# Patient Record
Sex: Male | Born: 1940 | Race: White | Hispanic: No | Marital: Married | Smoking: Former smoker
Health system: Southern US, Community
[De-identification: ages and names within clinical notes are randomized; demographics above are authoritative.]

## PROBLEM LIST (undated history)

## (undated) DIAGNOSIS — I10 Essential (primary) hypertension: Secondary | ICD-10-CM

## (undated) DIAGNOSIS — E039 Hypothyroidism, unspecified: Secondary | ICD-10-CM

## (undated) DIAGNOSIS — G4730 Sleep apnea, unspecified: Secondary | ICD-10-CM

## (undated) DIAGNOSIS — E119 Type 2 diabetes mellitus without complications: Secondary | ICD-10-CM

## (undated) DIAGNOSIS — I350 Nonrheumatic aortic (valve) stenosis: Secondary | ICD-10-CM

## (undated) HISTORY — PX: APPENDECTOMY: SHX54

---

## 2002-10-30 ENCOUNTER — Encounter: Payer: Self-pay | Admitting: Emergency Medicine

## 2002-10-30 ENCOUNTER — Emergency Department (HOSPITAL_COMMUNITY): Admission: EM | Admit: 2002-10-30 | Discharge: 2002-10-30 | Payer: Self-pay | Admitting: Emergency Medicine

## 2005-03-04 ENCOUNTER — Emergency Department (HOSPITAL_COMMUNITY): Admission: EM | Admit: 2005-03-04 | Discharge: 2005-03-05 | Payer: Self-pay | Admitting: *Deleted

## 2005-10-09 ENCOUNTER — Inpatient Hospital Stay (HOSPITAL_COMMUNITY): Admission: AD | Admit: 2005-10-09 | Discharge: 2005-10-12 | Payer: Self-pay | Admitting: Endocrinology

## 2008-04-24 ENCOUNTER — Ambulatory Visit: Payer: Self-pay | Admitting: Internal Medicine

## 2008-04-24 DIAGNOSIS — R141 Gas pain: Secondary | ICD-10-CM

## 2008-04-24 DIAGNOSIS — K219 Gastro-esophageal reflux disease without esophagitis: Secondary | ICD-10-CM | POA: Insufficient documentation

## 2008-04-24 DIAGNOSIS — R142 Eructation: Secondary | ICD-10-CM

## 2008-04-24 DIAGNOSIS — R143 Flatulence: Secondary | ICD-10-CM

## 2008-04-24 DIAGNOSIS — R1084 Generalized abdominal pain: Secondary | ICD-10-CM | POA: Insufficient documentation

## 2008-04-25 ENCOUNTER — Ambulatory Visit (HOSPITAL_COMMUNITY): Admission: RE | Admit: 2008-04-25 | Discharge: 2008-04-25 | Payer: Self-pay | Admitting: Internal Medicine

## 2008-05-23 ENCOUNTER — Ambulatory Visit: Payer: Self-pay | Admitting: Internal Medicine

## 2008-05-23 ENCOUNTER — Encounter: Payer: Self-pay | Admitting: Internal Medicine

## 2008-05-25 ENCOUNTER — Encounter: Payer: Self-pay | Admitting: Internal Medicine

## 2009-02-02 ENCOUNTER — Encounter: Payer: Self-pay | Admitting: Internal Medicine

## 2009-02-02 ENCOUNTER — Telehealth (INDEPENDENT_AMBULATORY_CARE_PROVIDER_SITE_OTHER): Payer: Self-pay | Admitting: *Deleted

## 2009-08-30 DIAGNOSIS — G4730 Sleep apnea, unspecified: Secondary | ICD-10-CM | POA: Diagnosis present

## 2009-08-30 DIAGNOSIS — F319 Bipolar disorder, unspecified: Secondary | ICD-10-CM | POA: Diagnosis present

## 2009-08-30 DIAGNOSIS — N400 Enlarged prostate without lower urinary tract symptoms: Secondary | ICD-10-CM | POA: Diagnosis present

## 2009-08-30 DIAGNOSIS — E785 Hyperlipidemia, unspecified: Secondary | ICD-10-CM | POA: Diagnosis present

## 2009-08-30 DIAGNOSIS — I2510 Atherosclerotic heart disease of native coronary artery without angina pectoris: Secondary | ICD-10-CM | POA: Insufficient documentation

## 2009-08-30 DIAGNOSIS — N401 Enlarged prostate with lower urinary tract symptoms: Secondary | ICD-10-CM | POA: Diagnosis present

## 2010-07-16 DIAGNOSIS — K760 Fatty (change of) liver, not elsewhere classified: Secondary | ICD-10-CM | POA: Insufficient documentation

## 2011-02-28 NOTE — Discharge Summary (Signed)
[name], [name]                 ACCOUNT NO.:  [id number]   MEDICAL RECORD NO.:  [id number]          PATIENT TYPE:  INP   LOCATION:  3710                         FACILITY:  [facility]   PHYSICIAN:  Tera Mater. Evlyn Kanner, M.D. DATE OF BIRTH:  10/09/1941   DATE OF ADMISSION:  10/09/2005  DATE OF DISCHARGE:  10/12/2005                                 DISCHARGE SUMMARY   DISCHARGE DIAGNOSES:  1.  Hyperosmolar nonketotic coma, clinically improved.  2.  Head and neck infection.  3.  Candida esophagitis.  4.  Possible sepsis syndrome.  5.  Hyperlipidemia.  6.  History of hypertension.  7.  History of bipolar disorder.  8.  Coronary artery disease, clinically stable.  9.  Sleep apnea.   CONSULTATIONS:  Kinnie Scales. Annalee Genta, M.D., October 10, 2005.   [name] is a 70 year old white male with longstanding impaired glucose  tolerance leading to diabetes in 1991.  He had been maintained without  specific diabetes medications and followed carefully as an outpatient.  He  presented to my office on October 09, 2005, being ill for 10 days.  He had  been seen September 29, 2005, with bilateral sialoadenitis and saw Dr.  Annalee Genta.  At the time I saw him he had no p.o. intake for a week of any  significance.  His fluid intake was poor.  He was weak and dizzy.  His blood  sugar had gone way up over 600.  He also had leukocytosis and hypotension.  Based on this, I was concerned that he might have ketoacidosis.  He at least  had significant volume deficits and perhaps early sepsis syndrome.  Fortunately, the initial evaluation was negative for ketoacidosis.  I  appeared that he just had out of control diabetes.  He was initially treated  with IV insulin and transitioned actually to oral agents.  Fluid  resuscitation and antibiotics significantly improved his situation.  He was  seen during this hospitalization by Dr. Onalee Hua L. Annalee Genta who did do a CT  scan of the neck.  He found no evidence of worsening  problems.  Patient was  clinically advanced on his diet, eating and drinking with improved vital  signs and was discharged home in improved condition on October 12, 2005.  At discharge, Dr. Jarold Motto chose to send him home on oral antidiabetic  agents.  He was started on Avandryl 4/4 mg once daily.  Additionally, given  Augmentin 875 twice a day for seven days, Nystatin four times a day for 10  days, additional medications included a resumption of his Mavik 4 mg daily,  Diovan 160/12.5 twice daily, Caduet 40 daily and aspirin 81 daily.  Glucophage was stopped.  He was to check his body weight each morning, call  if weight increases by five pounds.  Call for severe diarrhea.  Call for  blood sugars less than 60/300.  His diet was to be no concentrated sweets.  He had no pain management necessary.   LABORATORY DATA:  Final chemistry on October 10, 2005, sodium 134,  potassium 4.1, chloride 102, CO2 25, BUN 18,  creatinine 0.8, calcium 8.3,  glucose 86.  White count 19,700, hemoglobin 11.9, platelets 521,000.  Chemistries on October 11, 2006, sodium 134, potassium 4.1, chloride 105,  CO2 24, BUN 15, creatinine 0.9, glucose 180.  At that point, on October 11, 2006, white count was down to 16,400, hemoglobin 10.9, platelets 407,000.  At presentation, his chemistries revealed sodium 132, potassium 4.9,  chloride 95, CO2 26, BUN 36, creatinine 1.2, glucose 456, total protein 6.8,  albumin 2.3. AST 21 and repeat was 22, ALT was abnormal slightly at 46,  normal was 33, alkaline phosphatase 144, normal, repeat at 106, total  bilirubin 0.8.  Blood cultures x2 were negative.  C peptide was 1.32.   The radiology studies do not appear to have made it to the chart.   In summary, we have a 70 year old white male admitted with out of control  diabetes in the setting of a head and neck infection.  He did not have an  acute acidosis.  He was treated with IV insulin and transitioned to oral  agents.   He did have some pancreatic function as evidenced by C peptide.  Portion of blood cultures remain negative and patient clinically improved  promptly.  The patient was discharged home in improved condition as noted  above.           ______________________________  Tera Mater. Evlyn Kanner, M.D.     SAS/MEDQ  D:  12/25/2005  T:  12/26/2005  Job:  [id number]   cc:   Onalee Hua L. Annalee Genta, M.D.  Fax: [phone]

## 2011-04-15 DIAGNOSIS — E1129 Type 2 diabetes mellitus with other diabetic kidney complication: Secondary | ICD-10-CM | POA: Diagnosis present

## 2011-04-15 DIAGNOSIS — E1169 Type 2 diabetes mellitus with other specified complication: Secondary | ICD-10-CM | POA: Diagnosis present

## 2011-07-11 LAB — GLUCOSE, CAPILLARY
Glucose-Capillary: 124 — ABNORMAL HIGH
Glucose-Capillary: 159 — ABNORMAL HIGH

## 2011-12-29 DIAGNOSIS — E669 Obesity, unspecified: Secondary | ICD-10-CM | POA: Diagnosis present

## 2013-06-07 ENCOUNTER — Encounter: Payer: Self-pay | Admitting: Internal Medicine

## 2014-03-22 ENCOUNTER — Encounter: Payer: Self-pay | Admitting: Internal Medicine

## 2015-02-02 DIAGNOSIS — G4730 Sleep apnea, unspecified: Secondary | ICD-10-CM | POA: Diagnosis not present

## 2015-02-02 DIAGNOSIS — N08 Glomerular disorders in diseases classified elsewhere: Secondary | ICD-10-CM | POA: Diagnosis not present

## 2015-02-02 DIAGNOSIS — D126 Benign neoplasm of colon, unspecified: Secondary | ICD-10-CM | POA: Diagnosis not present

## 2015-02-02 DIAGNOSIS — I10 Essential (primary) hypertension: Secondary | ICD-10-CM | POA: Diagnosis not present

## 2015-02-02 DIAGNOSIS — N401 Enlarged prostate with lower urinary tract symptoms: Secondary | ICD-10-CM | POA: Diagnosis not present

## 2015-02-02 DIAGNOSIS — E1129 Type 2 diabetes mellitus with other diabetic kidney complication: Secondary | ICD-10-CM | POA: Diagnosis not present

## 2015-02-02 DIAGNOSIS — E785 Hyperlipidemia, unspecified: Secondary | ICD-10-CM | POA: Diagnosis not present

## 2015-02-02 DIAGNOSIS — I2510 Atherosclerotic heart disease of native coronary artery without angina pectoris: Secondary | ICD-10-CM | POA: Diagnosis not present

## 2015-02-08 ENCOUNTER — Encounter: Payer: Self-pay | Admitting: Internal Medicine

## 2015-03-14 DIAGNOSIS — H5210 Myopia, unspecified eye: Secondary | ICD-10-CM | POA: Diagnosis not present

## 2015-06-05 DIAGNOSIS — E1129 Type 2 diabetes mellitus with other diabetic kidney complication: Secondary | ICD-10-CM | POA: Diagnosis not present

## 2015-06-05 DIAGNOSIS — N08 Glomerular disorders in diseases classified elsewhere: Secondary | ICD-10-CM | POA: Diagnosis not present

## 2015-06-05 DIAGNOSIS — N401 Enlarged prostate with lower urinary tract symptoms: Secondary | ICD-10-CM | POA: Diagnosis not present

## 2015-06-05 DIAGNOSIS — D126 Benign neoplasm of colon, unspecified: Secondary | ICD-10-CM | POA: Diagnosis not present

## 2015-06-05 DIAGNOSIS — E11359 Type 2 diabetes mellitus with proliferative diabetic retinopathy without macular edema: Secondary | ICD-10-CM | POA: Diagnosis not present

## 2015-06-05 DIAGNOSIS — I2510 Atherosclerotic heart disease of native coronary artery without angina pectoris: Secondary | ICD-10-CM | POA: Diagnosis not present

## 2015-06-05 DIAGNOSIS — E785 Hyperlipidemia, unspecified: Secondary | ICD-10-CM | POA: Diagnosis not present

## 2015-06-05 DIAGNOSIS — K760 Fatty (change of) liver, not elsewhere classified: Secondary | ICD-10-CM | POA: Diagnosis not present

## 2015-10-09 DIAGNOSIS — E11319 Type 2 diabetes mellitus with unspecified diabetic retinopathy without macular edema: Secondary | ICD-10-CM | POA: Diagnosis not present

## 2015-10-09 DIAGNOSIS — I2510 Atherosclerotic heart disease of native coronary artery without angina pectoris: Secondary | ICD-10-CM | POA: Diagnosis not present

## 2015-10-09 DIAGNOSIS — N08 Glomerular disorders in diseases classified elsewhere: Secondary | ICD-10-CM | POA: Diagnosis not present

## 2015-10-09 DIAGNOSIS — D126 Benign neoplasm of colon, unspecified: Secondary | ICD-10-CM | POA: Diagnosis not present

## 2015-10-09 DIAGNOSIS — E1129 Type 2 diabetes mellitus with other diabetic kidney complication: Secondary | ICD-10-CM | POA: Diagnosis not present

## 2015-10-09 DIAGNOSIS — E113599 Type 2 diabetes mellitus with proliferative diabetic retinopathy without macular edema, unspecified eye: Secondary | ICD-10-CM | POA: Diagnosis not present

## 2015-10-09 DIAGNOSIS — E119 Type 2 diabetes mellitus without complications: Secondary | ICD-10-CM | POA: Diagnosis not present

## 2015-10-09 DIAGNOSIS — M5416 Radiculopathy, lumbar region: Secondary | ICD-10-CM | POA: Diagnosis not present

## 2016-02-06 DIAGNOSIS — I2510 Atherosclerotic heart disease of native coronary artery without angina pectoris: Secondary | ICD-10-CM | POA: Diagnosis not present

## 2016-02-06 DIAGNOSIS — Z1389 Encounter for screening for other disorder: Secondary | ICD-10-CM | POA: Diagnosis not present

## 2016-02-06 DIAGNOSIS — E784 Other hyperlipidemia: Secondary | ICD-10-CM | POA: Diagnosis not present

## 2016-02-06 DIAGNOSIS — E113593 Type 2 diabetes mellitus with proliferative diabetic retinopathy without macular edema, bilateral: Secondary | ICD-10-CM | POA: Diagnosis not present

## 2016-02-06 DIAGNOSIS — D126 Benign neoplasm of colon, unspecified: Secondary | ICD-10-CM | POA: Diagnosis not present

## 2016-02-06 DIAGNOSIS — N08 Glomerular disorders in diseases classified elsewhere: Secondary | ICD-10-CM | POA: Diagnosis not present

## 2016-02-06 DIAGNOSIS — Z6832 Body mass index (BMI) 32.0-32.9, adult: Secondary | ICD-10-CM | POA: Diagnosis not present

## 2016-02-06 DIAGNOSIS — N401 Enlarged prostate with lower urinary tract symptoms: Secondary | ICD-10-CM | POA: Diagnosis not present

## 2016-02-06 DIAGNOSIS — I10 Essential (primary) hypertension: Secondary | ICD-10-CM | POA: Diagnosis not present

## 2016-03-04 DIAGNOSIS — D225 Melanocytic nevi of trunk: Secondary | ICD-10-CM | POA: Diagnosis not present

## 2016-03-04 DIAGNOSIS — L814 Other melanin hyperpigmentation: Secondary | ICD-10-CM | POA: Diagnosis not present

## 2016-03-04 DIAGNOSIS — L820 Inflamed seborrheic keratosis: Secondary | ICD-10-CM | POA: Diagnosis not present

## 2016-03-04 DIAGNOSIS — D1801 Hemangioma of skin and subcutaneous tissue: Secondary | ICD-10-CM | POA: Diagnosis not present

## 2016-06-04 DIAGNOSIS — K760 Fatty (change of) liver, not elsewhere classified: Secondary | ICD-10-CM | POA: Diagnosis not present

## 2016-06-04 DIAGNOSIS — E784 Other hyperlipidemia: Secondary | ICD-10-CM | POA: Diagnosis not present

## 2016-06-04 DIAGNOSIS — M5416 Radiculopathy, lumbar region: Secondary | ICD-10-CM | POA: Diagnosis not present

## 2016-06-04 DIAGNOSIS — I2510 Atherosclerotic heart disease of native coronary artery without angina pectoris: Secondary | ICD-10-CM | POA: Diagnosis not present

## 2016-06-04 DIAGNOSIS — N401 Enlarged prostate with lower urinary tract symptoms: Secondary | ICD-10-CM | POA: Diagnosis not present

## 2016-06-04 DIAGNOSIS — E113599 Type 2 diabetes mellitus with proliferative diabetic retinopathy without macular edema, unspecified eye: Secondary | ICD-10-CM | POA: Diagnosis not present

## 2016-06-04 DIAGNOSIS — D126 Benign neoplasm of colon, unspecified: Secondary | ICD-10-CM | POA: Diagnosis not present

## 2016-06-04 DIAGNOSIS — E1129 Type 2 diabetes mellitus with other diabetic kidney complication: Secondary | ICD-10-CM | POA: Diagnosis not present

## 2016-06-04 DIAGNOSIS — N08 Glomerular disorders in diseases classified elsewhere: Secondary | ICD-10-CM | POA: Diagnosis not present

## 2016-09-16 DIAGNOSIS — N401 Enlarged prostate with lower urinary tract symptoms: Secondary | ICD-10-CM | POA: Diagnosis not present

## 2016-09-16 DIAGNOSIS — I10 Essential (primary) hypertension: Secondary | ICD-10-CM | POA: Diagnosis not present

## 2016-09-16 DIAGNOSIS — G4739 Other sleep apnea: Secondary | ICD-10-CM | POA: Diagnosis not present

## 2016-09-16 DIAGNOSIS — D126 Benign neoplasm of colon, unspecified: Secondary | ICD-10-CM | POA: Diagnosis not present

## 2016-09-16 DIAGNOSIS — E1129 Type 2 diabetes mellitus with other diabetic kidney complication: Secondary | ICD-10-CM | POA: Diagnosis not present

## 2016-09-16 DIAGNOSIS — I2510 Atherosclerotic heart disease of native coronary artery without angina pectoris: Secondary | ICD-10-CM | POA: Diagnosis not present

## 2016-09-16 DIAGNOSIS — E11319 Type 2 diabetes mellitus with unspecified diabetic retinopathy without macular edema: Secondary | ICD-10-CM | POA: Diagnosis not present

## 2016-09-16 DIAGNOSIS — E784 Other hyperlipidemia: Secondary | ICD-10-CM | POA: Diagnosis not present

## 2016-09-16 DIAGNOSIS — N08 Glomerular disorders in diseases classified elsewhere: Secondary | ICD-10-CM | POA: Diagnosis not present

## 2017-01-21 DIAGNOSIS — N08 Glomerular disorders in diseases classified elsewhere: Secondary | ICD-10-CM | POA: Diagnosis not present

## 2017-01-21 DIAGNOSIS — E113599 Type 2 diabetes mellitus with proliferative diabetic retinopathy without macular edema, unspecified eye: Secondary | ICD-10-CM | POA: Diagnosis not present

## 2017-01-21 DIAGNOSIS — E784 Other hyperlipidemia: Secondary | ICD-10-CM | POA: Diagnosis not present

## 2017-01-21 DIAGNOSIS — N401 Enlarged prostate with lower urinary tract symptoms: Secondary | ICD-10-CM | POA: Diagnosis not present

## 2017-01-21 DIAGNOSIS — I10 Essential (primary) hypertension: Secondary | ICD-10-CM | POA: Diagnosis not present

## 2017-01-21 DIAGNOSIS — M25551 Pain in right hip: Secondary | ICD-10-CM | POA: Diagnosis not present

## 2017-01-21 DIAGNOSIS — E1129 Type 2 diabetes mellitus with other diabetic kidney complication: Secondary | ICD-10-CM | POA: Diagnosis not present

## 2017-01-21 DIAGNOSIS — K219 Gastro-esophageal reflux disease without esophagitis: Secondary | ICD-10-CM | POA: Diagnosis not present

## 2017-01-21 DIAGNOSIS — K760 Fatty (change of) liver, not elsewhere classified: Secondary | ICD-10-CM | POA: Diagnosis not present

## 2017-02-27 DIAGNOSIS — K006 Disturbances in tooth eruption: Secondary | ICD-10-CM | POA: Diagnosis not present

## 2017-05-19 DIAGNOSIS — E1129 Type 2 diabetes mellitus with other diabetic kidney complication: Secondary | ICD-10-CM | POA: Diagnosis not present

## 2017-05-19 DIAGNOSIS — Z125 Encounter for screening for malignant neoplasm of prostate: Secondary | ICD-10-CM | POA: Diagnosis not present

## 2017-05-19 DIAGNOSIS — E784 Other hyperlipidemia: Secondary | ICD-10-CM | POA: Diagnosis not present

## 2017-05-19 DIAGNOSIS — I10 Essential (primary) hypertension: Secondary | ICD-10-CM | POA: Diagnosis not present

## 2017-05-26 DIAGNOSIS — E784 Other hyperlipidemia: Secondary | ICD-10-CM | POA: Diagnosis not present

## 2017-05-26 DIAGNOSIS — G4739 Other sleep apnea: Secondary | ICD-10-CM | POA: Diagnosis not present

## 2017-05-26 DIAGNOSIS — N08 Glomerular disorders in diseases classified elsewhere: Secondary | ICD-10-CM | POA: Diagnosis not present

## 2017-05-26 DIAGNOSIS — D126 Benign neoplasm of colon, unspecified: Secondary | ICD-10-CM | POA: Diagnosis not present

## 2017-05-26 DIAGNOSIS — Z0000 Encounter for general adult medical examination without abnormal findings: Secondary | ICD-10-CM | POA: Diagnosis not present

## 2017-05-26 DIAGNOSIS — E1129 Type 2 diabetes mellitus with other diabetic kidney complication: Secondary | ICD-10-CM | POA: Diagnosis not present

## 2017-05-26 DIAGNOSIS — Z6832 Body mass index (BMI) 32.0-32.9, adult: Secondary | ICD-10-CM | POA: Diagnosis not present

## 2017-05-26 DIAGNOSIS — I10 Essential (primary) hypertension: Secondary | ICD-10-CM | POA: Diagnosis not present

## 2017-05-26 DIAGNOSIS — E113599 Type 2 diabetes mellitus with proliferative diabetic retinopathy without macular edema, unspecified eye: Secondary | ICD-10-CM | POA: Diagnosis not present

## 2017-06-03 DIAGNOSIS — Z1212 Encounter for screening for malignant neoplasm of rectum: Secondary | ICD-10-CM | POA: Diagnosis not present

## 2017-08-18 DIAGNOSIS — L089 Local infection of the skin and subcutaneous tissue, unspecified: Secondary | ICD-10-CM | POA: Diagnosis not present

## 2017-08-18 DIAGNOSIS — Z6831 Body mass index (BMI) 31.0-31.9, adult: Secondary | ICD-10-CM | POA: Diagnosis not present

## 2017-09-01 DIAGNOSIS — R300 Dysuria: Secondary | ICD-10-CM | POA: Diagnosis not present

## 2017-09-14 DIAGNOSIS — H5210 Myopia, unspecified eye: Secondary | ICD-10-CM | POA: Diagnosis not present

## 2017-09-23 DIAGNOSIS — I10 Essential (primary) hypertension: Secondary | ICD-10-CM | POA: Diagnosis not present

## 2017-09-23 DIAGNOSIS — N08 Glomerular disorders in diseases classified elsewhere: Secondary | ICD-10-CM | POA: Diagnosis not present

## 2017-09-23 DIAGNOSIS — E7849 Other hyperlipidemia: Secondary | ICD-10-CM | POA: Diagnosis not present

## 2017-09-23 DIAGNOSIS — Z6831 Body mass index (BMI) 31.0-31.9, adult: Secondary | ICD-10-CM | POA: Diagnosis not present

## 2017-09-23 DIAGNOSIS — N401 Enlarged prostate with lower urinary tract symptoms: Secondary | ICD-10-CM | POA: Diagnosis not present

## 2017-09-23 DIAGNOSIS — E1129 Type 2 diabetes mellitus with other diabetic kidney complication: Secondary | ICD-10-CM | POA: Diagnosis not present

## 2017-09-23 DIAGNOSIS — H3520 Other non-diabetic proliferative retinopathy, unspecified eye: Secondary | ICD-10-CM | POA: Diagnosis not present

## 2017-09-23 DIAGNOSIS — I2510 Atherosclerotic heart disease of native coronary artery without angina pectoris: Secondary | ICD-10-CM | POA: Diagnosis not present

## 2017-09-23 DIAGNOSIS — K760 Fatty (change of) liver, not elsewhere classified: Secondary | ICD-10-CM | POA: Diagnosis not present

## 2018-01-26 DIAGNOSIS — K760 Fatty (change of) liver, not elsewhere classified: Secondary | ICD-10-CM | POA: Diagnosis not present

## 2018-01-26 DIAGNOSIS — H3520 Other non-diabetic proliferative retinopathy, unspecified eye: Secondary | ICD-10-CM | POA: Diagnosis not present

## 2018-01-26 DIAGNOSIS — D126 Benign neoplasm of colon, unspecified: Secondary | ICD-10-CM | POA: Diagnosis not present

## 2018-01-26 DIAGNOSIS — N183 Chronic kidney disease, stage 3 (moderate): Secondary | ICD-10-CM | POA: Diagnosis not present

## 2018-01-26 DIAGNOSIS — I10 Essential (primary) hypertension: Secondary | ICD-10-CM | POA: Diagnosis not present

## 2018-01-26 DIAGNOSIS — E7849 Other hyperlipidemia: Secondary | ICD-10-CM | POA: Diagnosis not present

## 2018-01-26 DIAGNOSIS — N401 Enlarged prostate with lower urinary tract symptoms: Secondary | ICD-10-CM | POA: Diagnosis not present

## 2018-01-26 DIAGNOSIS — E1129 Type 2 diabetes mellitus with other diabetic kidney complication: Secondary | ICD-10-CM | POA: Diagnosis not present

## 2018-01-26 DIAGNOSIS — I2510 Atherosclerotic heart disease of native coronary artery without angina pectoris: Secondary | ICD-10-CM | POA: Diagnosis not present

## 2018-05-25 DIAGNOSIS — D126 Benign neoplasm of colon, unspecified: Secondary | ICD-10-CM | POA: Diagnosis not present

## 2018-05-25 DIAGNOSIS — H3520 Other non-diabetic proliferative retinopathy, unspecified eye: Secondary | ICD-10-CM | POA: Diagnosis not present

## 2018-05-25 DIAGNOSIS — I10 Essential (primary) hypertension: Secondary | ICD-10-CM | POA: Diagnosis not present

## 2018-05-25 DIAGNOSIS — N401 Enlarged prostate with lower urinary tract symptoms: Secondary | ICD-10-CM | POA: Diagnosis not present

## 2018-05-25 DIAGNOSIS — N183 Chronic kidney disease, stage 3 (moderate): Secondary | ICD-10-CM | POA: Diagnosis not present

## 2018-05-25 DIAGNOSIS — I712 Thoracic aortic aneurysm, without rupture: Secondary | ICD-10-CM | POA: Diagnosis not present

## 2018-05-25 DIAGNOSIS — E1129 Type 2 diabetes mellitus with other diabetic kidney complication: Secondary | ICD-10-CM | POA: Diagnosis not present

## 2018-05-25 DIAGNOSIS — E7849 Other hyperlipidemia: Secondary | ICD-10-CM | POA: Diagnosis not present

## 2018-09-27 DIAGNOSIS — N183 Chronic kidney disease, stage 3 (moderate): Secondary | ICD-10-CM | POA: Diagnosis not present

## 2018-09-27 DIAGNOSIS — E7849 Other hyperlipidemia: Secondary | ICD-10-CM | POA: Diagnosis not present

## 2018-09-27 DIAGNOSIS — N401 Enlarged prostate with lower urinary tract symptoms: Secondary | ICD-10-CM | POA: Diagnosis not present

## 2018-09-27 DIAGNOSIS — E1129 Type 2 diabetes mellitus with other diabetic kidney complication: Secondary | ICD-10-CM | POA: Diagnosis not present

## 2018-09-27 DIAGNOSIS — E113599 Type 2 diabetes mellitus with proliferative diabetic retinopathy without macular edema, unspecified eye: Secondary | ICD-10-CM | POA: Diagnosis not present

## 2018-09-27 DIAGNOSIS — I10 Essential (primary) hypertension: Secondary | ICD-10-CM | POA: Diagnosis not present

## 2018-09-27 DIAGNOSIS — I712 Thoracic aortic aneurysm, without rupture: Secondary | ICD-10-CM | POA: Diagnosis not present

## 2019-01-12 DIAGNOSIS — L0109 Other impetigo: Secondary | ICD-10-CM | POA: Diagnosis not present

## 2019-01-12 DIAGNOSIS — L308 Other specified dermatitis: Secondary | ICD-10-CM | POA: Diagnosis not present

## 2019-02-07 DIAGNOSIS — M5416 Radiculopathy, lumbar region: Secondary | ICD-10-CM | POA: Diagnosis not present

## 2019-02-07 DIAGNOSIS — I10 Essential (primary) hypertension: Secondary | ICD-10-CM | POA: Diagnosis not present

## 2019-02-07 DIAGNOSIS — H3520 Other non-diabetic proliferative retinopathy, unspecified eye: Secondary | ICD-10-CM | POA: Diagnosis not present

## 2019-02-07 DIAGNOSIS — N183 Chronic kidney disease, stage 3 (moderate): Secondary | ICD-10-CM | POA: Diagnosis not present

## 2019-02-07 DIAGNOSIS — K760 Fatty (change of) liver, not elsewhere classified: Secondary | ICD-10-CM | POA: Diagnosis not present

## 2019-02-07 DIAGNOSIS — K219 Gastro-esophageal reflux disease without esophagitis: Secondary | ICD-10-CM | POA: Diagnosis not present

## 2019-02-07 DIAGNOSIS — E1129 Type 2 diabetes mellitus with other diabetic kidney complication: Secondary | ICD-10-CM | POA: Diagnosis not present

## 2019-02-07 DIAGNOSIS — E785 Hyperlipidemia, unspecified: Secondary | ICD-10-CM | POA: Diagnosis not present

## 2019-02-07 DIAGNOSIS — N401 Enlarged prostate with lower urinary tract symptoms: Secondary | ICD-10-CM | POA: Diagnosis not present

## 2019-05-16 DIAGNOSIS — E1129 Type 2 diabetes mellitus with other diabetic kidney complication: Secondary | ICD-10-CM | POA: Diagnosis not present

## 2019-05-16 DIAGNOSIS — N183 Chronic kidney disease, stage 3 (moderate): Secondary | ICD-10-CM | POA: Diagnosis not present

## 2019-05-16 DIAGNOSIS — E7849 Other hyperlipidemia: Secondary | ICD-10-CM | POA: Diagnosis not present

## 2019-05-16 DIAGNOSIS — Z125 Encounter for screening for malignant neoplasm of prostate: Secondary | ICD-10-CM | POA: Diagnosis not present

## 2019-05-17 DIAGNOSIS — N183 Chronic kidney disease, stage 3 (moderate): Secondary | ICD-10-CM | POA: Diagnosis not present

## 2019-05-18 DIAGNOSIS — M5416 Radiculopathy, lumbar region: Secondary | ICD-10-CM | POA: Diagnosis not present

## 2019-05-18 DIAGNOSIS — N401 Enlarged prostate with lower urinary tract symptoms: Secondary | ICD-10-CM | POA: Diagnosis not present

## 2019-05-18 DIAGNOSIS — E785 Hyperlipidemia, unspecified: Secondary | ICD-10-CM | POA: Diagnosis not present

## 2019-05-18 DIAGNOSIS — H3520 Other non-diabetic proliferative retinopathy, unspecified eye: Secondary | ICD-10-CM | POA: Diagnosis not present

## 2019-05-18 DIAGNOSIS — E1129 Type 2 diabetes mellitus with other diabetic kidney complication: Secondary | ICD-10-CM | POA: Diagnosis not present

## 2019-05-18 DIAGNOSIS — K760 Fatty (change of) liver, not elsewhere classified: Secondary | ICD-10-CM | POA: Diagnosis not present

## 2019-05-18 DIAGNOSIS — D126 Benign neoplasm of colon, unspecified: Secondary | ICD-10-CM | POA: Diagnosis not present

## 2019-05-18 DIAGNOSIS — I2510 Atherosclerotic heart disease of native coronary artery without angina pectoris: Secondary | ICD-10-CM | POA: Diagnosis not present

## 2019-05-18 DIAGNOSIS — N08 Glomerular disorders in diseases classified elsewhere: Secondary | ICD-10-CM | POA: Diagnosis not present

## 2019-08-01 DIAGNOSIS — H5210 Myopia, unspecified eye: Secondary | ICD-10-CM | POA: Diagnosis not present

## 2019-09-14 DIAGNOSIS — N401 Enlarged prostate with lower urinary tract symptoms: Secondary | ICD-10-CM | POA: Diagnosis not present

## 2019-09-14 DIAGNOSIS — I10 Essential (primary) hypertension: Secondary | ICD-10-CM | POA: Diagnosis not present

## 2019-09-14 DIAGNOSIS — D126 Benign neoplasm of colon, unspecified: Secondary | ICD-10-CM | POA: Diagnosis not present

## 2019-09-14 DIAGNOSIS — E1129 Type 2 diabetes mellitus with other diabetic kidney complication: Secondary | ICD-10-CM | POA: Diagnosis not present

## 2019-09-14 DIAGNOSIS — E785 Hyperlipidemia, unspecified: Secondary | ICD-10-CM | POA: Diagnosis not present

## 2019-09-14 DIAGNOSIS — H3520 Other non-diabetic proliferative retinopathy, unspecified eye: Secondary | ICD-10-CM | POA: Diagnosis not present

## 2019-09-14 DIAGNOSIS — I712 Thoracic aortic aneurysm, without rupture: Secondary | ICD-10-CM | POA: Diagnosis not present

## 2019-09-14 DIAGNOSIS — F319 Bipolar disorder, unspecified: Secondary | ICD-10-CM | POA: Diagnosis not present

## 2019-09-14 DIAGNOSIS — K760 Fatty (change of) liver, not elsewhere classified: Secondary | ICD-10-CM | POA: Diagnosis not present

## 2019-09-14 DIAGNOSIS — E113599 Type 2 diabetes mellitus with proliferative diabetic retinopathy without macular edema, unspecified eye: Secondary | ICD-10-CM | POA: Diagnosis not present

## 2019-09-14 DIAGNOSIS — E1121 Type 2 diabetes mellitus with diabetic nephropathy: Secondary | ICD-10-CM | POA: Diagnosis not present

## 2020-01-12 DIAGNOSIS — K760 Fatty (change of) liver, not elsewhere classified: Secondary | ICD-10-CM | POA: Diagnosis not present

## 2020-01-12 DIAGNOSIS — E113599 Type 2 diabetes mellitus with proliferative diabetic retinopathy without macular edema, unspecified eye: Secondary | ICD-10-CM | POA: Diagnosis not present

## 2020-01-12 DIAGNOSIS — N401 Enlarged prostate with lower urinary tract symptoms: Secondary | ICD-10-CM | POA: Diagnosis not present

## 2020-01-12 DIAGNOSIS — I10 Essential (primary) hypertension: Secondary | ICD-10-CM | POA: Diagnosis not present

## 2020-01-12 DIAGNOSIS — K219 Gastro-esophageal reflux disease without esophagitis: Secondary | ICD-10-CM | POA: Diagnosis not present

## 2020-01-12 DIAGNOSIS — N08 Glomerular disorders in diseases classified elsewhere: Secondary | ICD-10-CM | POA: Diagnosis not present

## 2020-01-12 DIAGNOSIS — E1121 Type 2 diabetes mellitus with diabetic nephropathy: Secondary | ICD-10-CM | POA: Diagnosis not present

## 2020-01-12 DIAGNOSIS — H3520 Other non-diabetic proliferative retinopathy, unspecified eye: Secondary | ICD-10-CM | POA: Diagnosis not present

## 2020-01-12 DIAGNOSIS — E785 Hyperlipidemia, unspecified: Secondary | ICD-10-CM | POA: Diagnosis not present

## 2020-01-12 DIAGNOSIS — E1129 Type 2 diabetes mellitus with other diabetic kidney complication: Secondary | ICD-10-CM | POA: Diagnosis not present

## 2020-01-12 DIAGNOSIS — I2510 Atherosclerotic heart disease of native coronary artery without angina pectoris: Secondary | ICD-10-CM | POA: Diagnosis not present

## 2021-04-04 ENCOUNTER — Other Ambulatory Visit: Payer: Self-pay

## 2021-04-04 ENCOUNTER — Encounter (HOSPITAL_COMMUNITY): Payer: Self-pay | Admitting: Pharmacy Technician

## 2021-04-04 ENCOUNTER — Emergency Department (HOSPITAL_COMMUNITY): Payer: Medicare HMO

## 2021-04-04 ENCOUNTER — Emergency Department (HOSPITAL_COMMUNITY)
Admission: EM | Admit: 2021-04-04 | Discharge: 2021-04-04 | Disposition: A | Discharge status: Home / Self Care | Payer: Medicare HMO | Attending: Emergency Medicine | Admitting: Emergency Medicine

## 2021-04-04 DIAGNOSIS — M25512 Pain in left shoulder: Secondary | ICD-10-CM | POA: Insufficient documentation

## 2021-04-04 DIAGNOSIS — M19012 Primary osteoarthritis, left shoulder: Secondary | ICD-10-CM | POA: Diagnosis not present

## 2021-04-04 DIAGNOSIS — R001 Bradycardia, unspecified: Secondary | ICD-10-CM | POA: Diagnosis not present

## 2021-04-04 MED ORDER — TRAMADOL HCL 50 MG PO TABS: 50.0000 mg | ORAL_TABLET | Freq: Four times a day (QID) | ORAL | 0 refills | Status: DC | PRN

## 2021-04-04 NOTE — ED Triage Notes (Signed)
Pt here with reports of L shoulder pain onset today; pt states it feels like it is in the joint. Pt also c/o tingling to his L fingers.

## 2021-04-04 NOTE — ED Provider Notes (Signed)
Emergency Medicine Provider Triage Evaluation Note  [name] , a 80 y.o. male  was evaluated in triage.  Pt complains of pain in his left shoulder.  He states that the pain comes and goes.  He does not have any chest pain, discomfort, pressure.  No shortness of breath, diaphoresis nausea or vomiting.  He states he has been doing increased yard work due to tree damage.  He denies any aggravating or alleviating factors.  He thinks that he may have overworked or stressed his left shoulder..  Review of Systems  Positive: Left shoulder pain Negative: Chest pain, shortness of breath, diaphoresis, chest pressure, syncope  Physical Exam  BP 137/72 (BP Location: Right Arm)   Pulse (!) 54   Temp 98.2 F (36.8 C) (Oral)   Resp 15   SpO2 95%  Gen:   Awake, no distress   Resp:  Normal effort  MSK:   Moves extremities without difficulty  Other:  Left shoulder has pain with raising the arm above the shoulder.  There is no pronator drift.  Sensation intact to light touch to bilateral hands.  2+ bilateral radial pulses.  Sensation/pain is worsened with palpation over the posterior left shoulder.  Medical Decision Making  Medically screening exam initiated at 4:42 PM.  Appropriate orders placed.  [name] was informed that the remainder of the evaluation will be completed by another provider, this initial triage assessment does not replace that evaluation, and the importance of remaining in the ED until their evaluation is complete.  Note: Portions of this report may have been transcribed using voice recognition software. Every effort was made to ensure accuracy; however, inadvertent computerized transcription errors may be present    Lorin Glass, PA-C 04/04/21 1643    Fredia Sorrow, MD 04/04/21 [phone]

## 2021-04-04 NOTE — ED Provider Notes (Signed)
[facility] EMERGENCY DEPARTMENT Provider Note   CSN: [id number] Arrival date & time: 04/04/21  1603     History Chief Complaint  Patient presents with   Shoulder Pain    [name] is a 80 y.o. male.  Patient with left shoulder pain on and off for 3 weeks.  Resulting in some tingling to the fingers at times.  Made worse with movement of the arm.  No injury.  Is able to put his hand on top of his head.  That pain is right in the joint area.  Does not have an orthopedic surgeon that he follows with.  EKG was done as part of the medical screening exam without any acute changes.  No anterior chest pain no shortness of breath.  No weakness to his fingers.  Currently no numbness to the fingers.      History reviewed. No pertinent past medical history.  Patient Active Problem List   Diagnosis Date Noted   GERD 04/24/2008   FLATULENCE ERUCTATION AND GAS PAIN 04/24/2008   ABDOMINAL PAIN-GENERALIZED 04/24/2008    History reviewed. No pertinent surgical history.     No family history on file.     Home Medications Prior to Admission medications   Medication Sig Start Date End Date Taking? Authorizing Provider  traMADol (ULTRAM) 50 MG tablet Take 1 tablet (50 mg total) by mouth every 6 (six) hours as needed. 04/04/21  Yes Fredia Sorrow, MD    Allergies    Patient has no known allergies.  Review of Systems   Review of Systems  Constitutional:  Negative for chills and fever.  HENT:  Negative for ear pain and sore throat.   Eyes:  Negative for pain and visual disturbance.  Respiratory:  Negative for cough and shortness of breath.   Cardiovascular:  Negative for chest pain and palpitations.  Gastrointestinal:  Negative for abdominal pain and vomiting.  Genitourinary:  Negative for dysuria and hematuria.  Musculoskeletal:  Negative for arthralgias and back pain.  Skin:  Negative for color change and rash.  Neurological:  Negative for seizures and syncope.   All other systems reviewed and are negative.  Physical Exam Updated Vital Signs BP (!) 171/75   Pulse (!) 49   Temp 98.2 F (36.8 C) (Oral)   Resp 10   SpO2 100%   Physical Exam Vitals and nursing note reviewed.  Constitutional:      Appearance: He is well-developed.  HENT:     Head: Normocephalic and atraumatic.  Eyes:     Conjunctiva/sclera: Conjunctivae normal.     Pupils: Pupils are equal, round, and reactive to light.  Cardiovascular:     Rate and Rhythm: Normal rate and regular rhythm.     Heart sounds: No murmur heard. Pulmonary:     Effort: Pulmonary effort is normal. No respiratory distress.     Breath sounds: Normal breath sounds.  Chest:     Chest wall: No tenderness.  Abdominal:     Palpations: Abdomen is soft.     Tenderness: There is no abdominal tenderness.  Musculoskeletal:        General: No swelling, tenderness or deformity.     Cervical back: Neck supple.     Comments: Discomfort with range of motion of the left shoulder.  But able to put his hand on top of his head.  Radial pulses 2+.  Sensation to fingers is intact.  Good movement of fingers wrist and elbow.  No evidence of any  weakness.  Skin:    General: Skin is warm and dry.     Capillary Refill: Capillary refill takes less than 2 seconds.  Neurological:     General: No focal deficit present.     Mental Status: He is alert and oriented to person, place, and time.     Cranial Nerves: No cranial nerve deficit.     Sensory: No sensory deficit.     Motor: No weakness.    ED Results / Procedures / Treatments   Labs (all labs ordered are listed, but only abnormal results are displayed) Labs Reviewed - No data to display  EKG   Radiology DG Shoulder Left  Result Date: 04/04/2021 CLINICAL DATA:  Left shoulder pain.  No known injury. EXAM: LEFT SHOULDER - 2+ VIEW COMPARISON:  None. FINDINGS: There is no evidence of fracture or dislocation. Mild inferior glenohumeral spurring. Acromioclavicular  joint is normal. No erosion, bony destruction, or avascular necrosis. Soft tissues are unremarkable. IMPRESSION: Mild osteoarthritis of the left glenohumeral joint. Electronically Signed   By: Keith Rake M.D.   On: 04/04/2021 17:40    Procedures Procedures   Medications Ordered in ED Medications - No data to display  ED Course  I have reviewed the triage vital signs and the nursing notes.  Pertinent labs & imaging results that were available during my care of the patient were reviewed by me and considered in my medical decision making (see chart for details).    MDM Rules/Calculators/A&P                          Patient clinically seems to have discomfort in the left shoulder joint.  X-ray of that other areas shows mild arthritis of the glenohumeral joint.  No dislocation no fracture.  Does not seem to be consistent with a rotator cuff injury.  Not concerned about a cardiac event.  Since he has pain with range of motion of the left upper extremity in the shoulder area.  And has no anterior chest pain.  Will treat with a shoulder immobilizer tramadol as needed for pain and follow-up with orthopedics.   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  No EKG for comparison.  Patient will have follow-up with her primary care doctor for this.  No Evidence of an acute cardiac event Final Clinical Impression(s) / ED Diagnoses Final diagnoses:  Acute pain of left shoulder    Rx / DC Orders ED Discharge Orders          Ordered    traMADol (ULTRAM) 50 MG tablet  Every 6 hours PRN        04/04/21 2253             Fredia Sorrow, MD 04/04/21 2300

## 2021-04-04 NOTE — Discharge Instructions (Addendum)
Follow-up with your primary care provider that she have later this week.  Wear the shoulder immobilizer for comfort but try to wear it at all times.  Including sleeping with it on.  And will rest her left shoulder.  You can take it off to shower.  Take the tramadol as needed for worse pain.  Make an appointment to follow-up with orthopedics information above.  Return for any new or worse symptoms.

## 2021-04-10 DIAGNOSIS — F319 Bipolar disorder, unspecified: Secondary | ICD-10-CM | POA: Diagnosis not present

## 2021-04-10 DIAGNOSIS — E113553 Type 2 diabetes mellitus with stable proliferative diabetic retinopathy, bilateral: Secondary | ICD-10-CM | POA: Diagnosis not present

## 2021-04-10 DIAGNOSIS — E1129 Type 2 diabetes mellitus with other diabetic kidney complication: Secondary | ICD-10-CM | POA: Diagnosis not present

## 2021-04-10 DIAGNOSIS — I2510 Atherosclerotic heart disease of native coronary artery without angina pectoris: Secondary | ICD-10-CM | POA: Diagnosis not present

## 2021-04-10 DIAGNOSIS — E785 Hyperlipidemia, unspecified: Secondary | ICD-10-CM | POA: Diagnosis not present

## 2021-04-10 DIAGNOSIS — M25512 Pain in left shoulder: Secondary | ICD-10-CM | POA: Diagnosis not present

## 2021-04-10 DIAGNOSIS — I10 Essential (primary) hypertension: Secondary | ICD-10-CM | POA: Diagnosis not present

## 2021-04-10 DIAGNOSIS — K760 Fatty (change of) liver, not elsewhere classified: Secondary | ICD-10-CM | POA: Diagnosis not present

## 2021-04-10 DIAGNOSIS — I712 Thoracic aortic aneurysm, without rupture: Secondary | ICD-10-CM | POA: Diagnosis not present

## 2021-04-10 DIAGNOSIS — N401 Enlarged prostate with lower urinary tract symptoms: Secondary | ICD-10-CM | POA: Diagnosis not present

## 2021-10-15 DIAGNOSIS — E669 Obesity, unspecified: Secondary | ICD-10-CM | POA: Diagnosis not present

## 2021-10-15 DIAGNOSIS — I10 Essential (primary) hypertension: Secondary | ICD-10-CM | POA: Diagnosis not present

## 2021-10-15 DIAGNOSIS — E1129 Type 2 diabetes mellitus with other diabetic kidney complication: Secondary | ICD-10-CM | POA: Diagnosis not present

## 2021-10-15 DIAGNOSIS — K760 Fatty (change of) liver, not elsewhere classified: Secondary | ICD-10-CM | POA: Diagnosis not present

## 2021-10-15 DIAGNOSIS — I712 Thoracic aortic aneurysm, without rupture, unspecified: Secondary | ICD-10-CM | POA: Diagnosis not present

## 2021-10-15 DIAGNOSIS — G4730 Sleep apnea, unspecified: Secondary | ICD-10-CM | POA: Diagnosis not present

## 2021-10-15 DIAGNOSIS — E1151 Type 2 diabetes mellitus with diabetic peripheral angiopathy without gangrene: Secondary | ICD-10-CM | POA: Diagnosis not present

## 2021-10-15 DIAGNOSIS — E1122 Type 2 diabetes mellitus with diabetic chronic kidney disease: Secondary | ICD-10-CM | POA: Diagnosis not present

## 2021-10-15 DIAGNOSIS — E785 Hyperlipidemia, unspecified: Secondary | ICD-10-CM | POA: Diagnosis not present

## 2021-10-15 DIAGNOSIS — N1831 Chronic kidney disease, stage 3a: Secondary | ICD-10-CM | POA: Diagnosis not present

## 2021-10-15 DIAGNOSIS — I2510 Atherosclerotic heart disease of native coronary artery without angina pectoris: Secondary | ICD-10-CM | POA: Diagnosis not present

## 2021-10-15 DIAGNOSIS — E113553 Type 2 diabetes mellitus with stable proliferative diabetic retinopathy, bilateral: Secondary | ICD-10-CM | POA: Diagnosis not present

## 2022-01-01 ENCOUNTER — Other Ambulatory Visit: Payer: Self-pay

## 2022-01-01 ENCOUNTER — Emergency Department (HOSPITAL_COMMUNITY)
Admission: EM | Admit: 2022-01-01 | Discharge: 2022-01-01 | Disposition: A | Discharge status: Home / Self Care | Payer: Medicare HMO | Attending: Emergency Medicine | Admitting: Emergency Medicine

## 2022-01-01 ENCOUNTER — Encounter (HOSPITAL_COMMUNITY): Payer: Self-pay

## 2022-01-01 ENCOUNTER — Emergency Department (HOSPITAL_COMMUNITY): Payer: Medicare HMO

## 2022-01-01 DIAGNOSIS — R791 Abnormal coagulation profile: Secondary | ICD-10-CM | POA: Insufficient documentation

## 2022-01-01 DIAGNOSIS — R200 Anesthesia of skin: Secondary | ICD-10-CM | POA: Diagnosis not present

## 2022-01-01 DIAGNOSIS — R202 Paresthesia of skin: Secondary | ICD-10-CM | POA: Insufficient documentation

## 2022-01-01 DIAGNOSIS — R7309 Other abnormal glucose: Secondary | ICD-10-CM | POA: Diagnosis not present

## 2022-01-01 LAB — COMPREHENSIVE METABOLIC PANEL
ALT: 15 U/L (ref 0–44)
AST: 24 U/L (ref 15–41)
Albumin: 4.1 g/dL (ref 3.5–5.0)
Alkaline Phosphatase: 40 U/L (ref 38–126)
Anion gap: 10 (ref 5–15)
BUN: 20 mg/dL (ref 8–23)
CO2: 22 mmol/L (ref 22–32)
Calcium: 9.6 mg/dL (ref 8.9–10.3)
Chloride: 106 mmol/L (ref 98–111)
Creatinine, Ser: 1.05 mg/dL (ref 0.61–1.24)
GFR, Estimated: >60 mL/min (ref >60)
Glucose, Bld: 127 mg/dL — ABNORMAL HIGH (ref 70–99)
Potassium: 3.9 mmol/L (ref 3.5–5.1)
Sodium: 138 mmol/L (ref 135–145)
Total Bilirubin: 1.1 mg/dL (ref 0.3–1.2)
Total Protein: 6.4 g/dL — ABNORMAL LOW (ref 6.5–8.1)

## 2022-01-01 LAB — I-STAT CHEM 8, ED
BUN: 22 mg/dL (ref 8–23)
Calcium, Ion: 1.21 mmol/L (ref 1.15–1.40)
Chloride: 103 mmol/L (ref 98–111)
Creatinine, Ser: 0.9 mg/dL (ref 0.61–1.24)
Glucose, Bld: 121 mg/dL — ABNORMAL HIGH (ref 70–99)
HCT: 41 % (ref 39.0–52.0)
Hemoglobin: 13.9 g/dL (ref 13.0–17.0)
Potassium: 3.8 mmol/L (ref 3.5–5.1)
Sodium: 138 mmol/L (ref 135–145)
TCO2: 23 mmol/L (ref 22–32)

## 2022-01-01 LAB — CBG MONITORING, ED: Glucose-Capillary: 120 mg/dL — ABNORMAL HIGH (ref 70–99)

## 2022-01-01 LAB — DIFFERENTIAL
Abs Immature Granulocytes: 0.02 10*3/uL (ref 0.00–0.07)
Basophils Absolute: 0 10*3/uL (ref 0.0–0.1)
Basophils Relative: 1 %
Eosinophils Absolute: 0.4 10*3/uL (ref 0.0–0.5)
Eosinophils Relative: 6 %
Immature Granulocytes: 0 %
Lymphocytes Relative: 22 %
Lymphs Abs: 1.4 10*3/uL (ref 0.7–4.0)
Monocytes Absolute: 0.5 10*3/uL (ref 0.1–1.0)
Monocytes Relative: 7 %
Neutro Abs: 4.1 10*3/uL (ref 1.7–7.7)
Neutrophils Relative %: 64 %

## 2022-01-01 LAB — APTT: aPTT: 35 seconds (ref 24–36)

## 2022-01-01 LAB — PROTIME-INR
INR: 1.3 — ABNORMAL HIGH (ref 0.8–1.2)
Prothrombin Time: 16.1 seconds — ABNORMAL HIGH (ref 11.4–15.2)

## 2022-01-01 LAB — CBC
HCT: 39.9 % (ref 39.0–52.0)
Hemoglobin: 13.3 g/dL (ref 13.0–17.0)
MCH: 30.4 pg (ref 26.0–34.0)
MCHC: 33.3 g/dL (ref 30.0–36.0)
MCV: 91.1 fL (ref 80.0–100.0)
Platelets: 209 10*3/uL (ref 150–400)
RBC: 4.38 MIL/uL (ref 4.22–5.81)
RDW: 13.1 % (ref 11.5–15.5)
WBC: 6.4 10*3/uL (ref 4.0–10.5)
nRBC: 0 % (ref 0.0–0.2)

## 2022-01-01 MED ORDER — SODIUM CHLORIDE 0.9% FLUSH: 3.0000 mL | Freq: Once | INTRAVENOUS | Status: DC

## 2022-01-01 NOTE — Discharge Instructions (Signed)
You were seen in the emergency department for some numbness in your calf and tingling in your fingertips.  You had lab work, CAT scan of your head and an MRI of your brain that did not show an obvious explanation for your symptoms.  You should follow-up with your primary care doctor.  We also putting a referral in for neurology for you.  Return to the emergency department if any worsening or concerning symptoms ?

## 2022-01-01 NOTE — ED Provider Notes (Signed)
?[place] ?Provider Note ? ? ?CSN: [id number] ?Arrival date & time: 01/01/22  1336 ? ?  ? ?History ? ?Chief Complaint  ?Patient presents with  ? Numbness  ? ? ?[name] is a 81 y.o. male.  He is here with some numbness in his right calf that is been on and off since yesterday.  Does not affect any walking or balance.  Today he noticed some tingling in the fingertips of his left hand.  This occurred around 1 PM today while he was working in [employer].  That seems to have resolved now.  There is never any clumsiness or weakness of the hand and the tingling did not extend anywhere else other than the fingertips.  No chest pain or shortness of breath.  No blurry vision or double vision.  No prior history of stroke. ? ?The history is provided by the patient.  ?Neurologic Problem ?This is a new problem. The current episode started 3 to 5 hours ago. The problem occurs constantly. The problem has been gradually improving. Pertinent negatives include no chest pain, no abdominal pain, no headaches and no shortness of breath. Nothing aggravates the symptoms. Nothing relieves the symptoms. He has tried nothing for the symptoms. The treatment provided moderate relief.  ? ?  ? ?Home Medications ?Prior to Admission medications   ?Medication Sig Start Date End Date Taking? Authorizing Provider  ?traMADol (ULTRAM) 50 MG tablet Take 1 tablet (50 mg total) by mouth every 6 (six) hours as needed. 04/04/21   Fredia Sorrow, MD  ?   ? ?Allergies    ?Patient has no known allergies.   ? ?Review of Systems   ?Review of Systems  ?Constitutional:  Negative for fever.  ?HENT:  Negative for sore throat.   ?Eyes:  Negative for visual disturbance.  ?Respiratory:  Negative for shortness of breath.   ?Cardiovascular:  Negative for chest pain.  ?Gastrointestinal:  Negative for abdominal pain.  ?Genitourinary:  Negative for dysuria.  ?Musculoskeletal:  Positive for back pain (chronic). Negative for neck  pain.  ?Skin:  Negative for rash.  ?Neurological:  Positive for numbness. Negative for weakness and headaches.  ? ?Physical Exam ?Updated Vital Signs ?BP (!) 169/65   Pulse (!) 51   Temp 97.7 ?F (36.5 ?C) (Oral)   Resp 18   Ht '5\' 6"'$  (1.676 m)   Wt 77.6 kg   SpO2 99%   BMI 27.60 kg/m?  ?Physical Exam ?Vitals and nursing note reviewed.  ?Constitutional:   ?   General: He is not in acute distress. ?   Appearance: Normal appearance. He is well-developed.  ?HENT:  ?   Head: Normocephalic and atraumatic.  ?Eyes:  ?   Conjunctiva/sclera: Conjunctivae normal.  ?Cardiovascular:  ?   Rate and Rhythm: Normal rate and regular rhythm.  ?   Heart sounds: No murmur heard. ?Pulmonary:  ?   Effort: Pulmonary effort is normal. No respiratory distress.  ?   Breath sounds: Normal breath sounds.  ?Abdominal:  ?   Palpations: Abdomen is soft.  ?   Tenderness: There is no abdominal tenderness.  ?Musculoskeletal:     ?   General: No swelling.  ?   Cervical back: Neck supple.  ?Skin: ?   General: Skin is warm and dry.  ?   Capillary Refill: Capillary refill takes less than 2 seconds.  ?Neurological:  ?   General: No focal deficit present.  ?   Mental Status: He is alert  and oriented to person, place, and time.  ?   Cranial Nerves: No cranial nerve deficit.  ?   Sensory: Sensory deficit present.  ?   Motor: No weakness.  ?   Gait: Gait normal.  ?   Comments: Patient has normal strength of upper and lower extremities.  No cranial nerve deficits normal speech.  Patient has subjective decrease sensation to his right calf posteriorly.  No other deficits appreciated.  ?Psychiatric:     ?   Mood and Affect: Mood normal.  ? ? ?ED Results / Procedures / Treatments   ?Labs ?(all labs ordered are listed, but only abnormal results are displayed) ?Labs Reviewed  ?PROTIME-INR - Abnormal; Notable for the following components:  ?    Result Value  ? Prothrombin Time 16.1 (*)   ? INR 1.3 (*)   ? All other components within normal limits   ?COMPREHENSIVE METABOLIC PANEL - Abnormal; Notable for the following components:  ? Glucose, Bld 127 (*)   ? Total Protein 6.4 (*)   ? All other components within normal limits  ?I-STAT CHEM 8, ED - Abnormal; Notable for the following components:  ? Glucose, Bld 121 (*)   ? All other components within normal limits  ?CBG MONITORING, ED - Abnormal; Notable for the following components:  ? Glucose-Capillary 120 (*)   ? All other components within normal limits  ?APTT  ?CBC  ?DIFFERENTIAL  ? ? ?EKG ?EKG Interpretation ? ?Date/Time:  Wednesday January 01 2022 17:51:31 EDT ?Ventricular Rate:  113 ?PR Interval:  228 ?QRS Duration: 145 ?QT Interval:  388 ?QTC Calculation: 459 ?R Axis:   -1 ?Text Interpretation: Sinus or ectopic atrial tachycardia Multiform ventricular premature complexes Sinus pause Prolonged PR interval Right bundle branch block Left ventricular hypertrophy Inferior infarct, old increased PVCs ad? rate from prior? 6/22 Confirmed by Aletta Edouard [phone]) on 01/01/2022 5:59:08 PM ? ?Radiology ?CT HEAD WO CONTRAST ? ?Result Date: 01/01/2022 ?CLINICAL DATA:  Numbness EXAM: CT HEAD WITHOUT CONTRAST TECHNIQUE: Contiguous axial images were obtained from the base of the skull through the vertex without intravenous contrast. RADIATION DOSE REDUCTION: This exam was performed according to the departmental dose-optimization program which includes automated exposure control, adjustment of the mA and/or kV according to patient size and/or use of iterative reconstruction technique. COMPARISON:  January 03, 2005 FINDINGS: Brain: No significant parenchymal volume loss given patient's age. 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 No evidence of acute large vascular territory infarction, hemorrhage, hydrocephalus, extra-axial collection or mass lesion/mass effect. Vascular: No hyperdense vessel. Atherosclerotic calcifications of the internal carotid and vertebral arteries at the skull base. Skull:  Normal. Negative for fracture or focal lesion. Sinuses/Orbits: Mild mucosal thickening of the mastoid sinuses and ethmoid air cells. Orbits are unremarkable. Other: Trace bilateral mastoid effusions. IMPRESSION: 1. No acute intracranial abnormality. 2. 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 3. Trace bilateral mastoid effusions with mild mucosal thickening of the mastoid air cells and ethmoid sinuses may reflect sinus disease. Electronically Signed   By: Dahlia Bailiff M.D.   On: 01/01/2022 14:39  ? ?MR BRAIN WO CONTRAST ? ?Result Date: 01/01/2022 ?CLINICAL DATA:  Acute neuro deficit.  Right calf numbness. EXAM: MRI HEAD WITHOUT CONTRAST TECHNIQUE: Multiplanar, multiecho pulse sequences of the brain and surrounding structures were obtained without intravenous contrast. COMPARISON:  CT head 01/01/2022 FINDINGS: Brain: Limited study. Patient not able to complete the study and terminated the examination early. Negative for acute infarct.  Mild periventricular deep white matter hyperintensity bilaterally. Ventricle size normal. No mass or edema. Vascular: Normal arterial flow voids at the skull base. Skull and upper cervical spine: No focal skeletal lesion. Sinuses/Orbits: Mild mucosal edema paranasal sinuses. Bilateral cataract extraction Other: None IMPRESSION: Limited study.  Patient not able to complete the examination Negative for acute infarct. Mild chronic microvascular ischemic change in the white matter. Electronically Signed   By: Franchot Gallo M.D.   On: 01/01/2022 20:05   ? ?Procedures ?Procedures  ? ? ?Medications Ordered in ED ?Medications - No data to display ? ? ?ED Course/ Medical Decision Making/ A&P ?Clinical Course as of 01/02/22 1123  ?Wed Jan 01, 2022  ?1832 Reviewed symptoms with Dr. Theda Sers neurology.  He recommends getting an MRI as this potentially could be a thalamic lesion.  Reconsult if MRI positive. [MB]  ?2016 MRI interpreted as limited exam but no acute infarct.   Will discharge and have follow-up with PCP.  Return instructions discussed. [MB]  ?  ?Clinical Course User Index ?[MB] Hayden Rasmussen, MD  ? ?                        ?Medical Decision Making ?Amount and/or Complexity of

## 2022-01-01 NOTE — ED Triage Notes (Signed)
Pt arrived POV from home c/o some numbness and tingling to bilateral hands and the right leg/foot. Pt last remembers being normal at 1am. Pt went to bed and woke up still feeling the numbness. Pt states he feels "goofy" this morning.  ?

## 2022-01-01 NOTE — ED Provider Triage Note (Signed)
Emergency Medicine Provider Triage Evaluation Note ? ?[name] , a 81 y.o. male  was evaluated in triage.  Pt complains of right calf numbness since 1am this morning. He said symptoms came on suddenly with both hands "feeling funny". He went to sleep and when he woke up the symptoms persisted. He still feels strength in his hands but feels like his "coordination is off".  ? ?Review of Systems  ?Positive: Right calf numbness ?Negative: Headache, vision changes, speech changes ? ?Physical Exam  ?BP (!) 143/98   Pulse 62   Temp 97.7 ?F (36.5 ?C) (Oral)   Resp 16   SpO2 95%  ?Gen:   Awake, no distress   ?Resp:  Normal effort  ?MSK:   Moves extremities without difficulty  ?Other:  5/5 strength in all extremities, no facial asymmetry, no pronator drift, clear speech, decreased sensation of the right lower extremity compared to the left, normal sensation in upper extremities ? ?Medical Decision Making  ?Medically screening exam initiated at 1:45 PM.  Appropriate orders placed.  [name] was informed that the remainder of the evaluation will be completed by another provider, this initial triage assessment does not replace that evaluation, and the importance of remaining in the ED until their evaluation is complete. ? ?Will not call code stroke at this time. Will obtain labs and imaging ?  ?Kateri Plummer, PA-C ?01/01/22 1403 ? ?

## 2022-01-01 NOTE — ED Notes (Signed)
To

## 2022-01-01 NOTE — ED Notes (Signed)
Patient transported to MRI 

## 2022-01-25 ENCOUNTER — Encounter (HOSPITAL_COMMUNITY): Payer: Self-pay | Admitting: Emergency Medicine

## 2022-01-25 ENCOUNTER — Emergency Department (HOSPITAL_COMMUNITY): Payer: Medicare HMO

## 2022-01-25 ENCOUNTER — Other Ambulatory Visit: Payer: Self-pay

## 2022-01-25 ENCOUNTER — Emergency Department (HOSPITAL_COMMUNITY)
Admission: EM | Admit: 2022-01-25 | Discharge: 2022-01-25 | Disposition: A | Discharge status: Home / Self Care | Payer: Medicare HMO | Attending: Emergency Medicine | Admitting: Emergency Medicine

## 2022-01-25 DIAGNOSIS — M4186 Other forms of scoliosis, lumbar region: Secondary | ICD-10-CM | POA: Diagnosis not present

## 2022-01-25 DIAGNOSIS — E871 Hypo-osmolality and hyponatremia: Secondary | ICD-10-CM | POA: Diagnosis not present

## 2022-01-25 DIAGNOSIS — K5900 Constipation, unspecified: Secondary | ICD-10-CM | POA: Diagnosis not present

## 2022-01-25 DIAGNOSIS — N400 Enlarged prostate without lower urinary tract symptoms: Secondary | ICD-10-CM | POA: Diagnosis not present

## 2022-01-25 DIAGNOSIS — M5136 Other intervertebral disc degeneration, lumbar region: Secondary | ICD-10-CM | POA: Diagnosis not present

## 2022-01-25 DIAGNOSIS — I878 Other specified disorders of veins: Secondary | ICD-10-CM | POA: Diagnosis not present

## 2022-01-25 LAB — CBC WITH DIFFERENTIAL/PLATELET
Abs Immature Granulocytes: 0.02 10*3/uL (ref 0.00–0.07)
Basophils Absolute: 0 10*3/uL (ref 0.0–0.1)
Basophils Relative: 0 %
Eosinophils Absolute: 0.1 10*3/uL (ref 0.0–0.5)
Eosinophils Relative: 1 %
HCT: 37.7 % — ABNORMAL LOW (ref 39.0–52.0)
Hemoglobin: 13.2 g/dL (ref 13.0–17.0)
Immature Granulocytes: 0 %
Lymphocytes Relative: 23 %
Lymphs Abs: 1.6 10*3/uL (ref 0.7–4.0)
MCH: 30.9 pg (ref 26.0–34.0)
MCHC: 35 g/dL (ref 30.0–36.0)
MCV: 88.3 fL (ref 80.0–100.0)
Monocytes Absolute: 0.5 10*3/uL (ref 0.1–1.0)
Monocytes Relative: 8 %
Neutro Abs: 4.9 10*3/uL (ref 1.7–7.7)
Neutrophils Relative %: 68 %
Platelets: 238 10*3/uL (ref 150–400)
RBC: 4.27 MIL/uL (ref 4.22–5.81)
RDW: 12.4 % (ref 11.5–15.5)
WBC: 7.2 10*3/uL (ref 4.0–10.5)
nRBC: 0 % (ref 0.0–0.2)

## 2022-01-25 LAB — COMPREHENSIVE METABOLIC PANEL
ALT: 22 U/L (ref 0–44)
AST: 37 U/L (ref 15–41)
Albumin: 4.4 g/dL (ref 3.5–5.0)
Alkaline Phosphatase: 49 U/L (ref 38–126)
Anion gap: 11 (ref 5–15)
BUN: 11 mg/dL (ref 8–23)
CO2: 21 mmol/L — ABNORMAL LOW (ref 22–32)
Calcium: 9.8 mg/dL (ref 8.9–10.3)
Chloride: 99 mmol/L (ref 98–111)
Creatinine, Ser: 1.11 mg/dL (ref 0.61–1.24)
GFR, Estimated: >60 mL/min (ref >60)
Glucose, Bld: 77 mg/dL (ref 70–99)
Potassium: 3.9 mmol/L (ref 3.5–5.1)
Sodium: 131 mmol/L — ABNORMAL LOW (ref 135–145)
Total Bilirubin: 1.5 mg/dL — ABNORMAL HIGH (ref 0.3–1.2)
Total Protein: 6.9 g/dL (ref 6.5–8.1)

## 2022-01-25 LAB — URINALYSIS, ROUTINE W REFLEX MICROSCOPIC
Bilirubin Urine: NEGATIVE
Glucose, UA: NEGATIVE mg/dL
Hgb urine dipstick: NEGATIVE
Ketones, ur: NEGATIVE mg/dL
Leukocytes,Ua: NEGATIVE
Nitrite: NEGATIVE
Protein, ur: NEGATIVE mg/dL
Specific Gravity, Urine: 1.003 — ABNORMAL LOW (ref 1.005–1.030)
pH: 6 (ref 5.0–8.0)

## 2022-01-25 MED ORDER — IOHEXOL 300 MG/ML  SOLN
100.0000 mL | Freq: Once | INTRAMUSCULAR | Status: AC | PRN
  Administered 2022-01-25: 100 mL via INTRAVENOUS

## 2022-01-25 MED ORDER — IOHEXOL 9 MG/ML PO SOLN
ORAL | Status: AC
  Administered 2022-01-25: 500 mL
  Filled 2022-01-25: qty 1000

## 2022-01-25 NOTE — ED Provider Notes (Signed)
?[facility] ?Provider Note ? ? ?CSN: [id number] ?Arrival date & time: 01/25/22  0327 ? ?  ? ?History ? ?Chief Complaint  ?Patient presents with  ? Constipation  ? ? ?[name] is a 81 y.o. male who presents to the ED today with complaint of constipation that began 6 days ago. Pt reports he began taking OTC Miralax for his constipation. He was able to have a regular BM 5 days ago however since that time has only had small pelleted stools. He has not had any BM's in 2 days time. Pt reports he has never had issues with this in the past. He does mention hx of internal hemorrhoids. While straining 2 days ago he believes he felt an internal hemorrhoid externally however has since gone back inside. He also began experiencing dysuria earlier today. Denies any abdominal pain however has some pressure in his lower back. No nausea or vomiting. PSHx appendectomy at the age of 58.  ? ?The history is provided by the patient and medical records.  ? ?  ? ?Home Medications ?Prior to Admission medications   ?Medication Sig Start Date End Date Taking? Authorizing Provider  ?amLODipine (NORVASC) 2.5 MG tablet Take 2.5 mg by mouth daily. 10/11/21  Yes [provider]  ?carboxymethylcellulose (REFRESH PLUS) 0.5 % SOLN Place 1 drop into both eyes daily as needed (dry eyes).   Yes [provider]  ?cholecalciferol (VITAMIN D3) 25 MCG (1000 UNIT) tablet Take 1,000 Units by mouth daily.   Yes [provider]  ?fenofibrate 160 MG tablet Take 160 mg by mouth daily. 11/08/21  Yes [provider]  ?fluticasone (FLONASE) 50 MCG/ACT nasal spray Place 2 sprays into both nostrils daily as needed for allergies. 06/24/18  Yes [provider]  ?irbesartan-hydrochlorothiazide (AVALIDE) 300-12.5 MG tablet Take 1 tablet by mouth daily. 11/14/21  Yes [provider]  ?loratadine (CLARITIN) 10 MG tablet Take 10 mg by mouth daily as needed for allergies. 02/11/10  Yes  [provider]  ?Multiple Vitamins-Minerals (ONE DAILY MULTIVITAMIN MEN) TABS Take 1 tablet by mouth daily. 10/09/15  Yes [provider]  ?polyethylene glycol (MIRALAX / GLYCOLAX) 17 g packet Take 17 g by mouth daily as needed for mild constipation.   Yes [provider]  ?   ? ?Allergies    ?Patient has no known allergies.   ? ?Review of Systems   ?Review of Systems  ?Constitutional:  Negative for chills and fever.  ?Gastrointestinal:  Positive for constipation. Negative for abdominal pain, nausea and vomiting.  ?Genitourinary:  Positive for dysuria. Negative for flank pain.  ?Musculoskeletal:  Positive for back pain.  ?All other systems reviewed and are negative. ? ?Physical Exam ?Updated Vital Signs ?BP 137/70   Pulse (!) 55   Temp 98.5 ?F (36.9 ?C) (Oral)   Resp 15   SpO2 97%  ?Physical Exam ?Vitals and nursing note reviewed.  ?Constitutional:   ?   Appearance: He is not ill-appearing or diaphoretic.  ?HENT:  ?   Head: Normocephalic and atraumatic.  ?Eyes:  ?   Conjunctiva/sclera: Conjunctivae normal.  ?Cardiovascular:  ?   Rate and Rhythm: Normal rate and regular rhythm.  ?   Pulses: Normal pulses.  ?Pulmonary:  ?   Effort: Pulmonary effort is normal.  ?   Breath sounds: Normal breath sounds. No wheezing, rhonchi or rales.  ?Abdominal:  ?   General: Abdomen is flat. Bowel sounds are absent.  ?   Tenderness: There  is no abdominal tenderness. There is no guarding or rebound.  ?Musculoskeletal:  ?   Cervical back: Neck supple.  ?Skin: ?   General: Skin is warm and dry.  ?Neurological:  ?   Mental Status: He is alert.  ? ? ?ED Results / Procedures / Treatments   ?Labs ?(all labs ordered are listed, but only abnormal results are displayed) ?Labs Reviewed  ?CBC WITH DIFFERENTIAL/PLATELET - Abnormal; Notable for the following components:  ?    Result Value  ? HCT 37.7 (*)   ? All other components within normal limits  ?COMPREHENSIVE METABOLIC PANEL - Abnormal; Notable for the following  components:  ? Sodium 131 (*)   ? CO2 21 (*)   ? Total Bilirubin 1.5 (*)   ? All other components within normal limits  ?URINALYSIS, ROUTINE W REFLEX MICROSCOPIC - Abnormal; Notable for the following components:  ? Color, Urine STRAW (*)   ? Specific Gravity, Urine 1.003 (*)   ? All other components within normal limits  ? ? ?EKG ?None ? ?Radiology ?CT Abdomen Pelvis W Contrast ? ?Result Date: 01/25/2022 ?CLINICAL DATA:  Abnormal bowel-gas pattern on current radiographs. Radiographs performed for constipation and low back pain. EXAM: CT ABDOMEN AND PELVIS WITH CONTRAST TECHNIQUE: Multidetector CT imaging of the abdomen and pelvis was performed using the standard protocol following bolus administration of intravenous contrast. RADIATION DOSE REDUCTION: This exam was performed according to the departmental dose-optimization program which includes automated exposure control, adjustment of the mA and/or kV according to patient size and/or use of iterative reconstruction technique. CONTRAST:  133m OMNIPAQUE IOHEXOL 300 MG/ML  SOLN COMPARISON:  Current abdomen radiographs. FINDINGS: Lower chest: No acute abnormality. Hepatobiliary: Liver normal in size. No mass or focal lesion. Normal gallbladder. No bile duct dilation. Pancreas: Pancreatic atrophy. No mass or inflammation or duct dilation. Spleen: Normal in size without focal abnormality. Adrenals/Urinary Tract: No adrenal masses. Mild renal cortical thinning. Small low-attenuation renal masses, too small to fully characterize, but consistent with cysts. No renal stones. No hydronephrosis. Normal ureters. Normal bladder. Stomach/Bowel: Stomach is unremarkable. Small bowel is normal in caliber with no wall thickening or inflammation. Colon is normal in caliber. No wall thickening or inflammation. No significant increase in the colonic stool burden. No evidence of appendicitis. Vascular/Lymphatic: Aortic atherosclerosis. No aneurysm. No enlarged lymph nodes. Reproductive:  Mild enlargement of the prostate, 5.1 x 3.6 cm transversely. Other: No abdominal wall hernia.  No ascites. Musculoskeletal: Chronic bilateral pars defects at L5-S1 with a grade 1 anterolisthesis. Degenerative changes noted throughout the visualized spine as well as a scoliosis. No acute fracture. No bone lesion. IMPRESSION: 1. No acute findings within the abdomen or pelvis. 2. No evidence of bowel obstruction or inflammation. No significant increase in the colonic stool burden. 3. Aortic atherosclerosis. Electronically Signed   By: DLajean ManesM.D.   On: 01/25/2022 13:01  ? ?DG Abd Acute W/Chest ? ?Result Date: 01/25/2022 ?CLINICAL DATA:  81year old male with constipation, low back pain. EXAM: DG ABDOMEN ACUTE WITH 1 VIEW CHEST COMPARISON:  Neck CT 09/20/2005. FINDINGS: PA view of the chest at 0412 hours. Lung volumes and mediastinal contours are within normal limits. Mild eventration of the diaphragm (normal variant). Both lungs appear clear. No pneumothorax or pneumoperitoneum. Upright and supine views of the abdomen and pelvis. Moderate dextroconvex scoliosis with advanced lumbar disc and endplate degeneration. Gas-filled bowel loops subjacent to the diaphragm with no convincing pneumoperitoneum. And nonobstructed bowel-gas pattern. No dilated loops. Below average volume of  retained stool. However, there is an unusual streaky appearance of bowel gas in the right abdomen suspicious for abnormal ascending colon. No acute osseous abnormality identified.  Pelvic phleboliths. IMPRESSION: 1. Abnormal streaky appearance of gas in the right abdomen suspicious for an Abnormal Right Colon. But nonobstructed bowel gas pattern with below average stool and no pneumoperitoneum identified. Recommend CT Abdomen and Pelvis with oral and IV contrast. 2.  No acute cardiopulmonary abnormality. Electronically Signed   By: Genevie Ann M.D.   On: 01/25/2022 04:42   ? ?Procedures ?Procedures  ? ? ?Medications Ordered in ED ?Medications   ?iohexol (OMNIPAQUE) 9 MG/ML oral solution (500 mLs  Contrast Given 01/25/22 0839)  ?iohexol (OMNIPAQUE) 300 MG/ML solution 100 mL (100 mLs Intravenous Contrast Given 01/25/22 1244)  ? ? ?ED Course/ Medical Tennis Must

## 2022-01-25 NOTE — Discharge Instructions (Signed)
Please follow up with your PCP for further evaluation of your ED visit today ? ?It is recommended that you take Miralax daily and to increase the amount of water you drink to help keep your stools soft. You can also increase the amount of fiber in your diet.  ? ?Your sodium level was slightly low today and you should have it rechecked in 1-2 weeks.  ? ?Return to the ED for any new/worsening symptoms  ?

## 2022-01-25 NOTE — ED Triage Notes (Addendum)
Patient here with constipation since Monday.  He states he has been using Miralax with no help.  He states that he is making some small stools, very hard and was straining to have the stool.  The last time he had a BM was Wednesday am.  He is having some back pain and pressure.  Patient denies any abdominal pain, no nausea or vomiting.   ?

## 2022-01-27 DIAGNOSIS — H02811 Retained foreign body in right upper eyelid: Secondary | ICD-10-CM | POA: Diagnosis not present

## 2022-01-30 DIAGNOSIS — H02052 Trichiasis without entropian right lower eyelid: Secondary | ICD-10-CM | POA: Diagnosis not present

## 2022-01-31 DIAGNOSIS — R300 Dysuria: Secondary | ICD-10-CM | POA: Diagnosis not present

## 2022-02-08 ENCOUNTER — Other Ambulatory Visit: Payer: Self-pay

## 2022-02-08 ENCOUNTER — Encounter (HOSPITAL_COMMUNITY): Payer: Self-pay | Admitting: Emergency Medicine

## 2022-02-08 ENCOUNTER — Inpatient Hospital Stay (HOSPITAL_COMMUNITY)
Admission: EM | Admit: 2022-02-08 | Discharge: 2022-02-11 | DRG: 640 | Disposition: A | Discharge status: Home / Self Care | Payer: Medicare HMO | Attending: Internal Medicine | Admitting: Internal Medicine

## 2022-02-08 DIAGNOSIS — R3911 Hesitancy of micturition: Secondary | ICD-10-CM | POA: Diagnosis present

## 2022-02-08 DIAGNOSIS — I10 Essential (primary) hypertension: Secondary | ICD-10-CM | POA: Diagnosis present

## 2022-02-08 DIAGNOSIS — R200 Anesthesia of skin: Secondary | ICD-10-CM | POA: Diagnosis present

## 2022-02-08 DIAGNOSIS — Z79899 Other long term (current) drug therapy: Secondary | ICD-10-CM | POA: Diagnosis not present

## 2022-02-08 DIAGNOSIS — G8929 Other chronic pain: Secondary | ICD-10-CM | POA: Diagnosis present

## 2022-02-08 DIAGNOSIS — Z833 Family history of diabetes mellitus: Secondary | ICD-10-CM

## 2022-02-08 DIAGNOSIS — E785 Hyperlipidemia, unspecified: Secondary | ICD-10-CM | POA: Diagnosis not present

## 2022-02-08 DIAGNOSIS — N179 Acute kidney failure, unspecified: Secondary | ICD-10-CM | POA: Diagnosis present

## 2022-02-08 DIAGNOSIS — M419 Scoliosis, unspecified: Secondary | ICD-10-CM | POA: Diagnosis present

## 2022-02-08 DIAGNOSIS — R531 Weakness: Secondary | ICD-10-CM

## 2022-02-08 DIAGNOSIS — E871 Hypo-osmolality and hyponatremia: Principal | ICD-10-CM | POA: Diagnosis present

## 2022-02-08 DIAGNOSIS — M545 Low back pain, unspecified: Secondary | ICD-10-CM | POA: Diagnosis not present

## 2022-02-08 DIAGNOSIS — Z87891 Personal history of nicotine dependence: Secondary | ICD-10-CM

## 2022-02-08 DIAGNOSIS — G9341 Metabolic encephalopathy: Secondary | ICD-10-CM | POA: Diagnosis present

## 2022-02-08 DIAGNOSIS — K5900 Constipation, unspecified: Secondary | ICD-10-CM | POA: Diagnosis present

## 2022-02-08 DIAGNOSIS — R296 Repeated falls: Secondary | ICD-10-CM | POA: Diagnosis present

## 2022-02-08 DIAGNOSIS — Z8249 Family history of ischemic heart disease and other diseases of the circulatory system: Secondary | ICD-10-CM

## 2022-02-08 DIAGNOSIS — M4126 Other idiopathic scoliosis, lumbar region: Secondary | ICD-10-CM | POA: Diagnosis not present

## 2022-02-08 DIAGNOSIS — R519 Headache, unspecified: Secondary | ICD-10-CM | POA: Diagnosis not present

## 2022-02-08 HISTORY — DX: Essential (primary) hypertension: I10

## 2022-02-08 NOTE — ED Triage Notes (Signed)
Pt reported to ED numerous complaints. State he has scoliosis and is having leg numbness to RLE. Pt states he was also having difficulty with urine stream. Pt the states he takes care of his wife and feels like is neglecting nursing himself. States he may be dehydrated. Pt also states he needs to urinate at time of triage and has been having a lot of bowel troubles. Was seen here a few weeks ago for constipation and is concerned for possible bowel obstruction but he feels that he is drinking ample amounts of water.  ?

## 2022-02-09 ENCOUNTER — Observation Stay (HOSPITAL_COMMUNITY): Payer: Medicare HMO

## 2022-02-09 ENCOUNTER — Encounter (HOSPITAL_COMMUNITY): Payer: Self-pay | Admitting: Internal Medicine

## 2022-02-09 DIAGNOSIS — M4126 Other idiopathic scoliosis, lumbar region: Secondary | ICD-10-CM | POA: Diagnosis not present

## 2022-02-09 DIAGNOSIS — R519 Headache, unspecified: Secondary | ICD-10-CM | POA: Diagnosis not present

## 2022-02-09 DIAGNOSIS — E871 Hypo-osmolality and hyponatremia: Secondary | ICD-10-CM | POA: Diagnosis not present

## 2022-02-09 DIAGNOSIS — R200 Anesthesia of skin: Secondary | ICD-10-CM | POA: Diagnosis not present

## 2022-02-09 DIAGNOSIS — I10 Essential (primary) hypertension: Secondary | ICD-10-CM | POA: Diagnosis present

## 2022-02-09 DIAGNOSIS — M545 Low back pain, unspecified: Secondary | ICD-10-CM | POA: Diagnosis not present

## 2022-02-09 LAB — CORTISOL: Cortisol, Plasma: 14.9 ug/dL

## 2022-02-09 LAB — BASIC METABOLIC PANEL
Anion gap: 10 (ref 5–15)
Anion gap: 11 (ref 5–15)
Anion gap: 9 (ref 5–15)
BUN: 12 mg/dL (ref 8–23)
BUN: 13 mg/dL (ref 8–23)
BUN: 13 mg/dL (ref 8–23)
CO2: 23 mmol/L (ref 22–32)
CO2: 23 mmol/L (ref 22–32)
CO2: 25 mmol/L (ref 22–32)
Calcium: 10 mg/dL (ref 8.9–10.3)
Calcium: 9.5 mg/dL (ref 8.9–10.3)
Calcium: 9.8 mg/dL (ref 8.9–10.3)
Chloride: 86 mmol/L — ABNORMAL LOW (ref 98–111)
Chloride: 88 mmol/L — ABNORMAL LOW (ref 98–111)
Chloride: 92 mmol/L — ABNORMAL LOW (ref 98–111)
Creatinine, Ser: 0.92 mg/dL (ref 0.61–1.24)
Creatinine, Ser: 0.93 mg/dL (ref 0.61–1.24)
Creatinine, Ser: 1.11 mg/dL (ref 0.61–1.24)
GFR, Estimated: >60 mL/min (ref >60)
GFR, Estimated: >60 mL/min (ref >60)
GFR, Estimated: >60 mL/min (ref >60)
Glucose, Bld: 123 mg/dL — ABNORMAL HIGH (ref 70–99)
Glucose, Bld: 82 mg/dL (ref 70–99)
Glucose, Bld: 89 mg/dL (ref 70–99)
Potassium: 3.9 mmol/L (ref 3.5–5.1)
Potassium: 4.1 mmol/L (ref 3.5–5.1)
Potassium: 4.5 mmol/L (ref 3.5–5.1)
Sodium: 119 mmol/L — CL (ref 135–145)
Sodium: 122 mmol/L — ABNORMAL LOW (ref 135–145)
Sodium: 126 mmol/L — ABNORMAL LOW (ref 135–145)

## 2022-02-09 LAB — CBC
HCT: 36.1 % — ABNORMAL LOW (ref 39.0–52.0)
HCT: 37 % — ABNORMAL LOW (ref 39.0–52.0)
Hemoglobin: 12.9 g/dL — ABNORMAL LOW (ref 13.0–17.0)
Hemoglobin: 13.4 g/dL (ref 13.0–17.0)
MCH: 30.4 pg (ref 26.0–34.0)
MCH: 30.9 pg (ref 26.0–34.0)
MCHC: 35.7 g/dL (ref 30.0–36.0)
MCHC: 36.2 g/dL — ABNORMAL HIGH (ref 30.0–36.0)
MCV: 84.9 fL (ref 80.0–100.0)
MCV: 85.3 fL (ref 80.0–100.0)
Platelets: 276 10*3/uL (ref 150–400)
Platelets: 282 10*3/uL (ref 150–400)
RBC: 4.25 MIL/uL (ref 4.22–5.81)
RBC: 4.34 MIL/uL (ref 4.22–5.81)
RDW: 12.1 % (ref 11.5–15.5)
RDW: 12.1 % (ref 11.5–15.5)
WBC: 8.5 10*3/uL (ref 4.0–10.5)
WBC: 9.1 10*3/uL (ref 4.0–10.5)
nRBC: 0 % (ref 0.0–0.2)
nRBC: 0 % (ref 0.0–0.2)

## 2022-02-09 LAB — URINALYSIS, ROUTINE W REFLEX MICROSCOPIC
Bacteria, UA: NONE SEEN
Bilirubin Urine: NEGATIVE
Glucose, UA: NEGATIVE mg/dL
Ketones, ur: NEGATIVE mg/dL
Leukocytes,Ua: NEGATIVE
Nitrite: NEGATIVE
Protein, ur: 30 mg/dL — AB
Specific Gravity, Urine: 1.008 (ref 1.005–1.030)
pH: 5 (ref 5.0–8.0)

## 2022-02-09 LAB — VITAMIN B12: Vitamin B-12: 738 pg/mL (ref 180–914)

## 2022-02-09 LAB — TSH: TSH: 5.549 u[IU]/mL — ABNORMAL HIGH (ref 0.350–4.500)

## 2022-02-09 LAB — SODIUM, URINE, RANDOM: Sodium, Ur: 33 mmol/L

## 2022-02-09 LAB — CREATININE, URINE, RANDOM: Creatinine, Urine: 33.66 mg/dL

## 2022-02-09 LAB — OSMOLALITY: Osmolality: 260 mOsm/kg — ABNORMAL LOW (ref 275–295)

## 2022-02-09 LAB — OSMOLALITY, URINE: Osmolality, Ur: 182 mOsm/kg — ABNORMAL LOW (ref 300–900)

## 2022-02-09 MED ORDER — BISACODYL 5 MG PO TBEC
10.0000 mg | DELAYED_RELEASE_TABLET | Freq: Once | ORAL | Status: AC
  Administered 2022-02-09: 10 mg via ORAL
  Filled 2022-02-09: qty 2

## 2022-02-09 MED ORDER — ACETAMINOPHEN 325 MG PO TABS: 650.0000 mg | ORAL_TABLET | Freq: Four times a day (QID) | ORAL | Status: DC | PRN

## 2022-02-09 MED ORDER — DOCUSATE SODIUM 100 MG PO CAPS
200.0000 mg | ORAL_CAPSULE | Freq: Two times a day (BID) | ORAL | Status: DC
  Administered 2022-02-09 – 2022-02-11 (×5): 200 mg via ORAL
  Filled 2022-02-09 (×5): qty 2

## 2022-02-09 MED ORDER — ACETAMINOPHEN 650 MG RE SUPP: 650.0000 mg | Freq: Four times a day (QID) | RECTAL | Status: DC | PRN

## 2022-02-09 MED ORDER — POLYETHYLENE GLYCOL 3350 17 G PO PACK
17.0000 g | PACK | Freq: Two times a day (BID) | ORAL | Status: DC
Start: 2022-02-09 — End: 2022-02-11
  Administered 2022-02-09 – 2022-02-11 (×5): 17 g via ORAL
  Filled 2022-02-09 (×5): qty 1

## 2022-02-09 MED ORDER — AMLODIPINE BESYLATE 5 MG PO TABS: 2.5000 mg | ORAL_TABLET | Freq: Every day | ORAL | Status: DC

## 2022-02-09 MED ORDER — FENOFIBRATE 160 MG PO TABS
160.0000 mg | ORAL_TABLET | Freq: Every day | ORAL | Status: DC
  Administered 2022-02-09 – 2022-02-11 (×3): 160 mg via ORAL
  Filled 2022-02-09 (×3): qty 1

## 2022-02-09 MED ORDER — IRBESARTAN 300 MG PO TABS
300.0000 mg | ORAL_TABLET | Freq: Every day | ORAL | Status: DC
  Administered 2022-02-09: 300 mg via ORAL
  Filled 2022-02-09 (×2): qty 1

## 2022-02-09 MED ORDER — DOCUSATE SODIUM 100 MG PO CAPS: 200.0000 mg | ORAL_CAPSULE | Freq: Two times a day (BID) | ORAL | Status: DC | PRN

## 2022-02-09 MED ORDER — ENOXAPARIN SODIUM 40 MG/0.4ML IJ SOSY
40.0000 mg | PREFILLED_SYRINGE | INTRAMUSCULAR | Status: DC
  Administered 2022-02-09 – 2022-02-10 (×2): 40 mg via SUBCUTANEOUS
  Filled 2022-02-09 (×2): qty 0.4

## 2022-02-09 MED ORDER — LACTULOSE 10 GM/15ML PO SOLN
20.0000 g | Freq: Two times a day (BID) | ORAL | Status: DC
  Administered 2022-02-09: 20 g via ORAL
  Filled 2022-02-09 (×4): qty 30

## 2022-02-09 NOTE — ED Notes (Signed)
In to assess patient. Patient reports difficulty urinating and pressure with urinating that began today. Patient is a poor historian but is alert and oriented to person place and time. Call bell in reach. ?

## 2022-02-09 NOTE — Evaluation (Addendum)
Physical Therapy Evaluation ?Patient Details ?Name: [name] ?MRN: [id number] ?DOB: [birth year] ?Today's Date: 02/09/2022 ? ?History of Present Illness ? The pt is an 81 yo male presenting 4/29 with RLE numbness and difficulty urinating. He has also presented to ED on 3/22 for same concerns, MRI neg for acute stoke, and 4/15 with constipation. Pt found to have severe hyponatremia, placed on fluid restriction. Imaging of head and spine with no acute changes. PMH includes: HTN, lumbar scoliosis. ?  ?Clinical Impression ? Pt in bed upon arrival of PT, agreeable to evaluation at this time. Prior to admission the pt was independent with all mobility, ADLs, IADLs, and driving without issue. He does report numbness in distal half of his R calf that has been ongoing for ~1 month and occasional burning sensation in bilateral feet over the past few weeks. The pt now presents with mild limitations in functional mobility, endurance, and dynamic stability due to above dx, and will continue to benefit from skilled PT to address these deficits. He was able to complete bed mobility and initial transfers without assist, and ambulate in the room without DME or instability. He maintains significant trunk flexion with gait due to scoliosis but states his mobility is at his baseline. Discussed how pt could benefit from OPPT evaluation to determine if targeted postural muscle strengthening may help to preserve functional ROM and reduce pain and the pt was open to the idea. Will be safe to return home with intermittent family assist (pt reports wife and son can assist) one medically stable.  ?   ?5X Sit-to-Stand: 16.4 sec (> 14.8 sec indicates increased risk of falls for individuals aged 21-89, > 15 sec indicates increased risk of recurrent falls) ?  ? ?Recommendations for follow up therapy are one component of a multi-disciplinary discharge planning process, led by the attending physician.  Recommendations may be updated based on patient  status, additional functional criteria and insurance authorization. ? ?Follow Up Recommendations Outpatient PT ? ?  ?Assistance Recommended at Discharge Intermittent Supervision/Assistance  ?Patient can return home with the following ? Assistance with cooking/housework;Assist for transportation;Help with stairs or ramp for entrance ? ?  ?Equipment Recommendations None recommended by PT  ?Recommendations for Other Services ?    ?  ?Functional Status Assessment Patient has had a recent decline in their functional status and demonstrates the ability to make significant improvements in function in a reasonable and predictable amount of time.  ? ?  ?Precautions / Restrictions Precautions ?Precautions: None ?Restrictions ?Weight Bearing Restrictions: No  ? ?  ? ?Mobility ? Bed Mobility ?Overal bed mobility: Modified Independent ?  ?  ?  ?  ?  ?  ?General bed mobility comments: increased time and effort, no assist ?  ? ?Transfers ?Overall transfer level: Needs assistance ?Equipment used: None ?Transfers: Sit to/from Stand ?Sit to Stand: Min guard ?  ?  ?  ?  ?  ?General transfer comment: minG for safety, no initial LOB but pt bracing BLE on bed ?  ? ?Ambulation/Gait ?Ambulation/Gait assistance: Min guard ?Gait Distance (Feet): 45 Feet ?Assistive device: None ?Gait Pattern/deviations: Step-through pattern, Trunk flexed ?Gait velocity: WFL ?  ?  ?General Gait Details: pt with significant trunk flexion which he reports is baseline due to scoliosis. no overt LOB ? ?  ? ?Balance Overall balance assessment: Mild deficits observed, not formally tested ?  ?  ?  ?  ?  ?  ?  ?  ?  ?  ?  ?  ?  ?  ?  ?  ?  ?  ?   ? ? ? ?  Pertinent Vitals/Pain Pain Assessment ?Pain Assessment: Faces ?Faces Pain Scale: Hurts a little bit ?Pain Location: inflamed hemorrhoids, burning ?Pain Descriptors / Indicators: Burning ?Pain Intervention(s): Limited activity within patient's tolerance, Monitored during session, Repositioned  ? ? ?Home Living  Family/patient expects to be discharged to:: Private residence ?Living Arrangements: Spouse/significant other ?Available Help at Discharge: Family;Available 24 hours/day ?Type of Home: House ?Home Access: Stairs to enter ?Entrance Stairs-Rails: None ?Entrance Stairs-Number of Steps: 4 ?  ?Home Layout: One level ?Home Equipment: Shower seat;Grab bars - tub/shower ?   ?  ?Prior Function Prior Level of Function : Driving;History of Falls (last six months) (one fall in last 6 months) ?  ?  ?  ?  ?  ?  ?Mobility Comments: pt reports independence without DME, single fall in last 6 months ?ADLs Comments: pt reports no use of shower seat, independent with ADLs and IADLs ?  ? ? ?Hand Dominance  ? Dominant Hand: Right ? ?  ?Extremity/Trunk Assessment  ? Upper Extremity Assessment ?Upper Extremity Assessment: Overall WFL for tasks assessed ?  ? ?Lower Extremity Assessment ?Lower Extremity Assessment: Overall WFL for tasks assessed;RLE deficits/detail ?RLE Deficits / Details: pt reports numbness on lower half of his R calf. reports it is also on medial and lateral aspects but not anterior aspect of shin. Reports this does not change with activity or position ?RLE Sensation: decreased light touch ?RLE Coordination: WNL ?  ? ?Cervical / Trunk Assessment ?Cervical / Trunk Assessment: Kyphotic;Other exceptions ?Cervical / Trunk Exceptions: hx of scoliosis  ?Communication  ? Communication: No difficulties  ?Cognition Arousal/Alertness: Awake/alert ?Behavior During Therapy: [facility] for tasks assessed/performed ?Overall Cognitive Status: Within Functional Limits for tasks assessed ?  ?  ?  ?  ?  ?  ?  ?  ?  ?  ?  ?  ?  ?  ?  ?  ?General Comments: grossly WFL, pt denies difficulty hearing but at times not responding to questions or stating "yes" to open ended questions, once repeated a few times pt able to answer appropriately. seems to be at his baseline ?  ?  ? ?  ?General Comments General comments (skin integrity, edema, etc.): VSS on  RA ? ?  ?Exercises Other Exercises ?Other Exercises: 5x sit-stand from chair without use of UE: 16 sec  ? ?Assessment/Plan  ?  ?PT Assessment Patient needs continued PT services  ?PT Problem List Decreased strength;Decreased activity tolerance;Decreased balance ? ?   ?  ?PT Treatment Interventions DME instruction;Gait training;Stair training;Functional mobility training;Therapeutic activities;Therapeutic exercise;Balance training;Patient/family education   ? ?PT Goals (Current goals can be found in the Care Plan section)  ?Acute Rehab PT Goals ?Patient Stated Goal: maintain independence ?PT Goal Formulation: With patient ?Time For Goal Achievement: 02/23/22 ?Potential to Achieve Goals: Good ? ?  ?Frequency Min 3X/week ?  ? ? ?   ?AM-PAC PT "6 Clicks" Mobility  ?Outcome Measure Help needed turning from your back to your side while in a flat bed without using bedrails?: None ?Help needed moving from lying on your back to sitting on the side of a flat bed without using bedrails?: None ?Help needed moving to and from a bed to a chair (including a wheelchair)?: A Little ?Help needed standing up from a chair using your arms (e.g., wheelchair or bedside chair)?: A Little ?Help needed to walk in hospital room?: A Little ?Help needed climbing 3-5 steps with a railing? : A Little ?6 Click Score: 20 ? ?  ?End of  Session Equipment Utilized During Treatment: Gait belt ?Activity Tolerance: Patient tolerated treatment well ?Patient left: in bed;with call bell/phone within reach ?Nurse Communication: Mobility status ?PT Visit Diagnosis: Other abnormalities of gait and mobility (R26.89);Muscle weakness (generalized) (M62.81) ?  ? ?Time: 0258-5277 ?PT Time Calculation (min) (ACUTE ONLY): 37 min ? ? ?Charges:   PT Evaluation ?$PT Eval Low Complexity: 1 Low ?PT Treatments ?$Therapeutic Exercise: 8-22 mins ?  ?   ? ? ?[place] Carbo, PT, DPT  ? ?Acute Rehabilitation Department ?Pager #: [phone] ? ?Sandra Cockayne ?02/09/2022, 11:03  AM ?

## 2022-02-09 NOTE — ED Notes (Signed)
Pt has 2+ right pedal pulse, cap refill less than 3 sec, warm to touch, decreased sensation ?

## 2022-02-09 NOTE — ED Notes (Signed)
Hospitalist at patient bedside

## 2022-02-09 NOTE — H&P (Signed)
?History and Physical  ? ? ?[name] POE:[id number] DOB: 1941-07-13 DOA: 02/08/2022 ? ?PCP: [name], MD  ?Patient coming from: Home. ? ?Chief Complaint: Lower extremity numbness. ? ?HPI: [name] is a 81 y.o. male with history of hypertension and hyperlipidemia who was recently in the hospital 2 weeks ago for constipation was advised to take MiraLAX and drink adequate fluid comes to the ER complaining of lower extremity numbness which has been ongoing for the last 1 month.  Patient states he also has been having some balance issues with walking for the last 1 week.  Denies any nausea vomiting.  Has been having some sinus symptoms.  He states since he left hospital 2 weeks ago he has been drinking almost a gallon of water.  He last moved his bowels 3 days ago. ? ?ED Course: In the ER patient appears nonfocal.  Labs show sodium of 119 it was 131 2 weeks ago.  Patient admitted for severe hyponatremia. ? ?Review of Systems: As per HPI, rest all negative. ? ? ?Past Medical History:  ?Diagnosis Date  ? Hypertension   ? ? ?Past Surgical History:  ?Procedure Laterality Date  ? APPENDECTOMY    ? ? ? reports that he has quit smoking. His smoking use included cigarettes. He has never used smokeless tobacco. He reports current alcohol use. He reports that he does not use drugs. ? ?No Known Allergies ? ?Family History  ?Problem Relation Age of Onset  ? CAD Father   ? Diabetes Mellitus II Maternal Grandmother   ? ? ?Prior to Admission medications   ?Medication Sig Start Date End Date Taking? Authorizing Provider  ?amLODipine (NORVASC) 2.5 MG tablet Take 2.5 mg by mouth daily. 10/11/21  Yes [provider]  ?carboxymethylcellulose (REFRESH PLUS) 0.5 % SOLN Place 1 drop into both eyes daily as needed (dry eyes).   Yes [provider]  ?docusate sodium (COLACE) 100 MG capsule Take 200 mg by mouth 2 (two) times daily as needed for mild constipation.   Yes [provider]  ?fenofibrate 160 MG  tablet Take 160 mg by mouth daily. 11/08/21  Yes [provider]  ?fluticasone (FLONASE) 50 MCG/ACT nasal spray Place 2 sprays into both nostrils daily as needed for allergies. 06/24/18  Yes [provider]  ?hydrocortisone (ANUSOL-HC) 2.5 % rectal cream Place 1 application. rectally 2 (two) times daily as needed for hemorrhoids. 01/31/22  Yes [provider]  ?irbesartan-hydrochlorothiazide (AVALIDE) 300-12.5 MG tablet Take 1 tablet by mouth daily. 11/14/21  Yes [provider]  ?loratadine (CLARITIN) 10 MG tablet Take 10 mg by mouth daily as needed for allergies. 02/11/10  Yes [provider]  ?neomycin-polymyxin b-dexamethasone (MAXITROL) 3.5-10000-0.1 SUSP 1 drop daily as needed (itchy eyes). 01/27/22  Yes [provider]  ?polyethylene glycol (MIRALAX / GLYCOLAX) 17 g packet Take 17 g by mouth 2 (two) times daily as needed for mild constipation.   Yes [provider]  ? ? ?Physical Exam: ?Constitutional: Moderately built and nourished. ?Vitals:  ? 02/08/22 2304 02/09/22 0153 02/09/22 0245  ?BP: (!) 164/79 (!) 186/72 (!) 145/63  ?Pulse: 65 (!) 54 (!) 55  ?Resp: 15 19   ?Temp: 98.5 ?F (36.9 ?C)    ?TempSrc: Oral    ?SpO2: 97% 100% 99%  ? ?Eyes: Anicteric no pallor. ?ENMT: No discharge from the ears eyes nose and mouth. ?Neck: No mass felt.  No neck rigidity. ?Respiratory: No rhonchi or crepitations. ?Cardiovascular: S1-S2 heard. ?Abdomen: Soft nontender bowel sound  present. ?Musculoskeletal: No edema. ?Skin: No rash. ?Neurologic: Alert awake oriented time place and person.  Moves all extremities. ?Psychiatric: Appears normal per normal affect. ? ? ?Labs on Admission: I have personally reviewed following labs and imaging studies ? ?CBC: ?Recent Labs  ?Lab 02/09/22 ?0002  ?WBC 9.1  ?HGB 12.9*  ?HCT 36.1*  ?MCV 84.9  ?PLT 282  ? ?Basic Metabolic Panel: ?Recent Labs  ?Lab 02/09/22 ?0002  ?NA 119*  ?K 4.5  ?CL 86*  ?CO2 23  ?GLUCOSE 123*  ?BUN 13  ?CREATININE 1.11   ?CALCIUM 9.8  ? ?GFR: ?CrCl cannot be calculated (Unknown ideal weight.). ?Liver Function Tests: ?No results for input(s): AST, ALT, ALKPHOS, BILITOT, PROT, ALBUMIN in the last 168 hours. ?No results for input(s): LIPASE, AMYLASE in the last 168 hours. ?No results for input(s): AMMONIA in the last 168 hours. ?Coagulation Profile: ?No results for input(s): INR, PROTIME in the last 168 hours. ?Cardiac Enzymes: ?No results for input(s): CKTOTAL, CKMB, CKMBINDEX, TROPONINI in the last 168 hours. ?BNP (last 3 results) ?No results for input(s): PROBNP in the last 8760 hours. ?HbA1C: ?No results for input(s): HGBA1C in the last 72 hours. ?CBG: ?No results for input(s): GLUCAP in the last 168 hours. ?Lipid Profile: ?No results for input(s): CHOL, HDL, LDLCALC, TRIG, CHOLHDL, LDLDIRECT in the last 72 hours. ?Thyroid Function Tests: ?No results for input(s): TSH, T4TOTAL, FREET4, T3FREE, THYROIDAB in the last 72 hours. ?Anemia Panel: ?No results for input(s): VITAMINB12, FOLATE, FERRITIN, TIBC, IRON, RETICCTPCT in the last 72 hours. ?Urine analysis: ?   ?Component Value Date/Time  ? [place] YELLOW 02/08/2022 2352  ? APPEARANCEUR CLEAR 02/08/2022 2352  ? LABSPEC 1.008 02/08/2022 2352  ? PHURINE 5.0 02/08/2022 2352  ? GLUCOSEU NEGATIVE 02/08/2022 2352  ? HGBUR SMALL (A) 02/08/2022 2352  ? [place] NEGATIVE 02/08/2022 2352  ? [place] NEGATIVE 02/08/2022 2352  ? PROTEINUR 30 (A) 02/08/2022 2352  ? NITRITE NEGATIVE 02/08/2022 2352  ? LEUKOCYTESUR NEGATIVE 02/08/2022 2352  ? ?Sepsis Labs: ?'@LABRCNTIP'$ (procalcitonin:4,lacticidven:4) ?)No results found for this or any previous visit (from the past 240 hour(s)).  ? ?Radiological Exams on Admission: ?No results found. ? ? ? ?Assessment/Plan ?Principal Problem: ?  Hyponatremia ?Active Problems: ?  Essential hypertension ?  ? ?Severe hyponatremia suspect likely from patient drinking too much fluid.  Will restrict fluids to 800 cc/day for now.  Discontinue hydrochlorothiazide.   Closely follow metabolic panel.  In addition we will also check urine sodium osmolality TSH and cortisol. ?Worsening numbness and difficulty ambulating with prior history of scoliosis.  Will check x-ray of the lumbar spine.  Since patient also has some gait issues will check CT head.  Get physical therapy consult.  In addition patient also will be checked on TSH B12 levels. ?Hypertension holding hydrochlorothiazide continue ARB.  We will keep patient n.p.o. and IV hydralazine. ?Hyperlipidemia on fenofibrate. ? ? ?DVT prophylaxis: Lovenox. ?Code Status: Full code. ?Family Communication: We will need to discuss with family. ?Disposition Plan: Home. ?Consults called: Physical therapy. ?Admission status: Observation. ? ? ?Rise Patience MD ?[facility] ?Pager [phone]. ? ?If 7PM-7AM, please contact night-coverage ?[email or web address] ?Password [email or web address] ? ?02/09/2022, 5:44 AM  ? ? ? ?

## 2022-02-09 NOTE — ED Notes (Signed)
Patient has had approximate total of 870m yellow clear urine output today. ?

## 2022-02-09 NOTE — ED Provider Notes (Signed)
? ?[facility]  ?Provider Note ? ?CSN: [id number] ?Arrival date & time: 02/08/22 2259 ? ?History ?Chief Complaint  ?Patient presents with  ? Leg Pain  ? ? ?[name] is a 81 y.o. male presents for evaluation of multiple complaints, he is feeling foggy headed and having difficulty focusing. He has had several weeks of R leg numbness, comes and goes, mostly on lateral lower leg, sometimes into his feet. He was in the ED for same 3/22 and had MRI which was incomplete but read as neg for acute stroke. HE was also seen in the ED 4/15 for constipation and advised to 'drink more water'. He is unable to quantify the amount of water he has been drinking but reports it has been at least a quart. He also reports some urinary hesitancy earlier today.  ? ? ?Home Medications ?Prior to Admission medications   ?Medication Sig Start Date End Date Taking? Authorizing Provider  ?amLODipine (NORVASC) 2.5 MG tablet Take 2.5 mg by mouth daily. 10/11/21   [provider]  ?carboxymethylcellulose (REFRESH PLUS) 0.5 % SOLN Place 1 drop into both eyes daily as needed (dry eyes).    [provider]  ?cholecalciferol (VITAMIN D3) 25 MCG (1000 UNIT) tablet Take 1,000 Units by mouth daily.    [provider]  ?fenofibrate 160 MG tablet Take 160 mg by mouth daily. 11/08/21   [provider]  ?fluticasone (FLONASE) 50 MCG/ACT nasal spray Place 2 sprays into both nostrils daily as needed for allergies. 06/24/18   [provider]  ?irbesartan-hydrochlorothiazide (AVALIDE) 300-12.5 MG tablet Take 1 tablet by mouth daily. 11/14/21   [provider]  ?loratadine (CLARITIN) 10 MG tablet Take 10 mg by mouth daily as needed for allergies. 02/11/10   [provider]  ?Multiple Vitamins-Minerals (ONE DAILY MULTIVITAMIN MEN) TABS Take 1 tablet by mouth daily. 10/09/15   [provider]  ?polyethylene glycol (MIRALAX / GLYCOLAX) 17 g packet Take 17 g by  mouth daily as needed for mild constipation.    [provider]  ? ? ? ?Allergies    ?Patient has no known allergies. ? ? ?Review of Systems   ?Review of Systems ?Please see HPI for pertinent positives and negatives ? ?Physical Exam ?BP (!) 164/79 (BP Location: Left Arm)   Pulse 65   Temp 98.5 ?F (36.9 ?C) (Oral)   Resp 15   SpO2 97%  ? ?Physical Exam ?Vitals and nursing note reviewed.  ?Constitutional:   ?   Appearance: Normal appearance.  ?HENT:  ?   Head: Normocephalic and atraumatic.  ?   Nose: Nose normal.  ?   Mouth/Throat:  ?   Mouth: Mucous membranes are moist.  ?Eyes:  ?   Extraocular Movements: Extraocular movements intact.  ?   Conjunctiva/sclera: Conjunctivae normal.  ?Cardiovascular:  ?   Rate and Rhythm: Normal rate.  ?Pulmonary:  ?   Effort: Pulmonary effort is normal.  ?   Breath sounds: Normal breath sounds.  ?Abdominal:  ?   General: Abdomen is flat.  ?   Palpations: Abdomen is soft.  ?   Tenderness: There is no abdominal tenderness. There is no guarding.  ?Musculoskeletal:     ?   General: No swelling. Normal range of motion.  ?   Cervical back: Neck supple.  ?Skin: ?   General: Skin is warm and dry.  ?Neurological:  ?   General: No focal deficit present.  ?   Mental Status: He  is alert.  ?   Cranial Nerves: No cranial nerve deficit.  ?   Sensory: No sensory deficit.  ?   Motor: No weakness.  ?   Comments: Slow to respond to questions, stops mid-sentence and loses train of thought frequently.   ?Psychiatric:     ?   Mood and Affect: Mood normal.  ? ? ?ED Results / Procedures / Treatments   ?EKG ?None ? ?Procedures ?Procedures ? ?Medications Ordered in the ED ?Medications - No data to display ? ?Initial Impression and Plan ? Patient with multiple complaints, but more concerning for his mental status not being at baseline. He had labs done in triage which showed a marked hyponatremia, likely from polydipsia. He had normal UA and CBC with anemia at baseline. Will plan admission for  hyponatremia, Hospitalist paged.  ? ?ED Course  ? ?Clinical Course as of 02/09/22 0244  ?Sun Feb 09, 2022  ?1031 Spoke with Dr. Hal Hope, Hospitalist, who will evaluate for admission.  [CS]  ?  ?Clinical Course User Index ?[CS] Truddie Hidden, MD  ? ? ? ?MDM Rules/Calculators/A&P ?Medical Decision Making ?Problems Addressed: ?Hyponatremia: acute illness or injury ? ?Amount and/or Complexity of Data Reviewed ?Labs: ordered. Decision-making details documented in ED Course. ? ?Risk ?Decision regarding hospitalization. ? ? ? ?Final Clinical Impression(s) / ED Diagnoses ?Final diagnoses:  ?Hyponatremia  ? ? ?Rx / DC Orders ?ED Discharge Orders   ? ? None  ? ?  ? ?  ?Truddie Hidden, MD ?02/09/22 [phone] ? ?

## 2022-02-09 NOTE — Progress Notes (Signed)
?                                  PROGRESS NOTE                                             ?                                                                                                                     ?                                         ? ? Patient Demographics:  ? ? [name], is a 81 y.o. male, DOB - 04-01-1941, LNL:[id number] ? ?Outpatient Primary MD for the patient is [name], MD    LOS - 0  Admit date - 02/08/2022   ? ?Chief Complaint  ?Patient presents with  ? Leg Pain  ?    ? ?Brief Narrative (HPI from H&P)   81 y.o. male with history of hypertension and hyperlipidemia who was recently in the hospital 2 weeks ago for constipation was advised to take MiraLAX and drink adequate fluid comes to the ER complaining of lower extremity numbness which has been ongoing for the last 1 month.  Patient states he also has been having some balance issues with walking for the last 1 week.  Denies any nausea vomiting.  Has been having some sinus symptoms.  He states since he left hospital 2 weeks ago he has been drinking almost a gallon of water.  He last moved his bowels 3 days ago. ? ?In the ER work-up suggestive of severe hyponatremia and he was admitted for further care. ? ? Subjective:  ? ? [name] today has, No headache, No chest pain, No abdominal pain - No Nausea, No new weakness, some chronic leg numbness, no SOB. ? ? Assessment  & Plan :  ? ? ?Hyponatremia likely due to excessive free water intake.  Not primary polydipsia, he was drinking excessive water to relieve his constipation, also was on HCTZ.  Currently placed only on fluid restriction, will let him self-correct, monitor BMP, continue supportive care. ? ?2.  Metabolic encephalopathy.  Likely due to #1 above.  No focal deficits, no headache, monitor with #1 above.  Minimize benzodiazepines and narcotics. ? ?3.  Deconditioning, falls at home.  PT OT.  May require placement ? ?4.  Hypertension.  For now  ARB and as needed hydralazine. ? ?5.  Dyslipidemia.  Continue fenofibrate. ? ?6.  Constipation.  Placed on bowel regimen. ? ?7.  Chronic lower extremity numbness and weakness.  Supportive care, no focal deficits.  Will reevaluate once #1 above is better, most likely  outpatient neuro follow-up. ? ? ?   ? ?Condition - Extremely Guarded ? ?Family Communication  :   ? ?Wife [phone] - on 02/09/22 ? ?Code Status :  Full ? ?Consults  :   ? ?PUD Prophylaxis :  ? ? Procedures  :    ? ?CT Head - Non acute ? ?   ? ?Disposition Plan  :   ? ?Status is: Observation ? ?DVT Prophylaxis  :   ? ?enoxaparin (LOVENOX) injection 40 mg Start: 02/09/22 1600 ? ? ?Lab Results  ?Component Value Date  ? PLT 276 02/09/2022  ? ? ?Diet :  ?Diet Order   ? ?       ?  Diet Heart Room service appropriate? Yes; Fluid consistency: Thin  Diet effective now       ?  ? ?  ?  ? ?  ?  ? ?Inpatient Medications ? ?Scheduled Meds: ? enoxaparin (LOVENOX) injection  40 mg Subcutaneous Q24H  ? fenofibrate  160 mg Oral Daily  ? irbesartan  300 mg Oral Daily  ? ?Continuous Infusions: ?PRN Meds:.acetaminophen **OR** acetaminophen, docusate sodium ? ?Antibiotics  :   ? ?Anti-infectives (From admission, onward)  ? ? None  ? ?  ? ? ? Time Spent in minutes  30 ? ? ?Lala Lund M.D on 02/09/2022 at 8:54 AM ? ?To page go to [email or web address]  ? ?[facility] -  Office  [phone] ? ?See all Orders from today for further details ? ? ? Objective:  ? ?Vitals:  ? 02/09/22 0245 02/09/22 0330 02/09/22 0400 02/09/22 0630  ?BP: (!) 145/63 (!) 162/67 (!) 162/79 (!) 153/78  ?Pulse: (!) 55 (!) 54 (!) 58 (!) 58  ?Resp:      ?Temp:      ?TempSrc:      ?SpO2: 99% 100% 95% 99%  ? ? ?Wt Readings from Last 3 Encounters:  ?01/01/22 77.6 kg  ? ? ? ?Intake/Output Summary (Last 24 hours) at 02/09/2022 0854 ?Last data filed at 02/09/2022 [phone] ?Gross per 24 hour  ?Intake --  ?Output 800 ml  ?Net -800 ml  ? ? ? ?Physical Exam ? ?Awake Alert, No new F.N deficits, Normal  affect ?.AT,PERRAL ?Supple Neck, No JVD,   ?Symmetrical Chest wall movement, Good air movement bilaterally, CTAB ?RRR,No Gallops,Rubs or new Murmurs,  ?+ve B.Sounds, Abd Soft, No tenderness,   ?No Cyanosis, Clubbing or edema  ?   ? ? Data Review:  ? ? ?CBC ?Recent Labs  ?Lab 02/09/22 ?0002 02/09/22 ?0630  ?WBC 9.1 8.5  ?HGB 12.9* 13.4  ?HCT 36.1* 37.0*  ?PLT 282 276  ?MCV 84.9 85.3  ?MCH 30.4 30.9  ?MCHC 35.7 36.2*  ?RDW 12.1 12.1  ? ? ?Electrolytes ?Recent Labs  ?Lab 02/09/22 ?0002 02/09/22 ?0630  ?NA 119* 122*  ?K 4.5 4.1  ?CL 86* 88*  ?CO2 23 23  ?GLUCOSE 123* 89  ?BUN 13 12  ?CREATININE 1.11 0.93  ?CALCIUM 9.8 10.0  ?TSH  --  5.549*  ? ? ?------------------------------------------------------------------------------------------------------------------ ?No results for input(s): CHOL, HDL, LDLCALC, TRIG, CHOLHDL, LDLDIRECT in the last 72 hours. ? ?No results found for: HGBA1C ? ?Recent Labs  ?  02/09/22 ?0630  ?TSH 5.549*  ? ?  ?Micro Results ?No results found for this or any previous visit (from the past 240 hour(s)). ? ?Radiology Reports ?DG Lumbar Spine 2-3 Views ? ?Result Date: 02/09/2022 ?CLINICAL DATA:  81 year old male with pain and numbness in the lower extremities. Scoliosis. EXAM: LUMBAR SPINE -  2-3 VIEW COMPARISON:  CT Abdomen and Pelvis 01/25/2022. FINDINGS: Normal lumbar segmentation. Moderate dextroconvex upper lumbar scoliosis apex at L1-L2. 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 Superimposed chronic bilateral L5 pars fractures with grade 1 anterolisthesis at L5-S1. Stable vertebral height and alignment. No acute osseous abnormality identified. Aortoiliac calcified atherosclerosis. Negative visible bowel gas. Sacrum and SI joints appear negative. IMPRESSION: 1. No acute osseous abnormality identified in the lumbar spine. 2. 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 3. 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 4.  Aortic Atherosclerosis (ICD10-I70.0). Electronically Signed   By: Genevie Ann M.D.   On: 02/09/2022 07:20  ? ?CT HEAD WO CONTRAST (5MM) ? ?Result Date: 02/09/2022 ?CLINICAL DATA:  81 year old male with neurologic deficit. Pain and numbness in the lower extremities. Scoliosis. EXAM: CT HEAD WITHOUT CONTRAST TECHNIQUE: Contiguous axial images were obtained from the base of the skull through the vertex without intravenous contrast. RADIATION DOSE REDUCTION: This exam was performed according to the departmental dose-optimization program which includes automated exposure control, adjustment of the mA and/or kV according to patient size and/or use of iterative reconstruction technique. COMPARISON:  Brain MRI and Head CT 01/01/2022. FINDINGS: Brain: No midline shift, ventriculomegaly, mass effect, evidence of mass lesion, intracranial hemorrhage or evidence of cortically based acute infarction. Mild to moderate for age scattered white matter hypodensity appears stable from last month. Vascular: Calcified atherosclerosis at the skull base. No suspicious intracranial vascular hyperdensity. Skull: No acute osseous abnormality identified. Sinuses/Orbits: Moderate paranasal sinus mucosal thickening sparing the right maxillary and sphenoids, mildly increased from last month. Tympanic cavities remain clear. Right mastoid effusion is chronic and stable. Other: No acute orbit or scalp soft tissue finding. IMPRESSION: 1. No acute intracranial abnormality. 2. Stable non contrast CT appearance of the brain since last month, mild to moderate for age white matter changes most commonly due to small vessel disease. 3. Paranasal sinus inflammation appears mildly progressed from last month. Electronically Signed   By: Genevie Ann M.D.   On: 02/09/2022 07:24    ? ? ?

## 2022-02-10 DIAGNOSIS — Z87891 Personal history of nicotine dependence: Secondary | ICD-10-CM | POA: Diagnosis not present

## 2022-02-10 DIAGNOSIS — G8929 Other chronic pain: Secondary | ICD-10-CM | POA: Diagnosis present

## 2022-02-10 DIAGNOSIS — Z8249 Family history of ischemic heart disease and other diseases of the circulatory system: Secondary | ICD-10-CM | POA: Diagnosis not present

## 2022-02-10 DIAGNOSIS — G9341 Metabolic encephalopathy: Secondary | ICD-10-CM | POA: Diagnosis present

## 2022-02-10 DIAGNOSIS — R3911 Hesitancy of micturition: Secondary | ICD-10-CM | POA: Diagnosis present

## 2022-02-10 DIAGNOSIS — E785 Hyperlipidemia, unspecified: Secondary | ICD-10-CM | POA: Diagnosis present

## 2022-02-10 DIAGNOSIS — K5900 Constipation, unspecified: Secondary | ICD-10-CM | POA: Diagnosis present

## 2022-02-10 DIAGNOSIS — R296 Repeated falls: Secondary | ICD-10-CM | POA: Diagnosis present

## 2022-02-10 DIAGNOSIS — R200 Anesthesia of skin: Secondary | ICD-10-CM | POA: Diagnosis present

## 2022-02-10 DIAGNOSIS — Z79899 Other long term (current) drug therapy: Secondary | ICD-10-CM | POA: Diagnosis not present

## 2022-02-10 DIAGNOSIS — E871 Hypo-osmolality and hyponatremia: Secondary | ICD-10-CM | POA: Diagnosis present

## 2022-02-10 DIAGNOSIS — Z833 Family history of diabetes mellitus: Secondary | ICD-10-CM | POA: Diagnosis not present

## 2022-02-10 DIAGNOSIS — N179 Acute kidney failure, unspecified: Secondary | ICD-10-CM | POA: Diagnosis present

## 2022-02-10 DIAGNOSIS — M419 Scoliosis, unspecified: Secondary | ICD-10-CM | POA: Diagnosis present

## 2022-02-10 DIAGNOSIS — I10 Essential (primary) hypertension: Secondary | ICD-10-CM | POA: Diagnosis present

## 2022-02-10 LAB — CBC WITH DIFFERENTIAL/PLATELET
Abs Immature Granulocytes: 0.02 10*3/uL (ref 0.00–0.07)
Basophils Absolute: 0 10*3/uL (ref 0.0–0.1)
Basophils Relative: 0 %
Eosinophils Absolute: 0.1 10*3/uL (ref 0.0–0.5)
Eosinophils Relative: 2 %
HCT: 32.9 % — ABNORMAL LOW (ref 39.0–52.0)
Hemoglobin: 11.7 g/dL — ABNORMAL LOW (ref 13.0–17.0)
Immature Granulocytes: 0 %
Lymphocytes Relative: 21 %
Lymphs Abs: 1.5 10*3/uL (ref 0.7–4.0)
MCH: 31.1 pg (ref 26.0–34.0)
MCHC: 35.6 g/dL (ref 30.0–36.0)
MCV: 87.5 fL (ref 80.0–100.0)
Monocytes Absolute: 0.6 10*3/uL (ref 0.1–1.0)
Monocytes Relative: 8 %
Neutro Abs: 4.9 10*3/uL (ref 1.7–7.7)
Neutrophils Relative %: 69 %
Platelets: 245 10*3/uL (ref 150–400)
RBC: 3.76 MIL/uL — ABNORMAL LOW (ref 4.22–5.81)
RDW: 12.3 % (ref 11.5–15.5)
WBC: 7.2 10*3/uL (ref 4.0–10.5)
nRBC: 0 % (ref 0.0–0.2)

## 2022-02-10 LAB — COMPREHENSIVE METABOLIC PANEL
ALT: 25 U/L (ref 0–44)
AST: 35 U/L (ref 15–41)
Albumin: 3.4 g/dL — ABNORMAL LOW (ref 3.5–5.0)
Alkaline Phosphatase: 42 U/L (ref 38–126)
Anion gap: 6 (ref 5–15)
BUN: 18 mg/dL (ref 8–23)
CO2: 26 mmol/L (ref 22–32)
Calcium: 9.3 mg/dL (ref 8.9–10.3)
Chloride: 96 mmol/L — ABNORMAL LOW (ref 98–111)
Creatinine, Ser: 1.26 mg/dL — ABNORMAL HIGH (ref 0.61–1.24)
GFR, Estimated: 57 mL/min — ABNORMAL LOW (ref >60)
Glucose, Bld: 81 mg/dL (ref 70–99)
Potassium: 4.2 mmol/L (ref 3.5–5.1)
Sodium: 128 mmol/L — ABNORMAL LOW (ref 135–145)
Total Bilirubin: 0.7 mg/dL (ref 0.3–1.2)
Total Protein: 5.7 g/dL — ABNORMAL LOW (ref 6.5–8.1)

## 2022-02-10 LAB — BASIC METABOLIC PANEL
Anion gap: 7 (ref 5–15)
BUN: 18 mg/dL (ref 8–23)
CO2: 28 mmol/L (ref 22–32)
Calcium: 9.1 mg/dL (ref 8.9–10.3)
Chloride: 93 mmol/L — ABNORMAL LOW (ref 98–111)
Creatinine, Ser: 1.26 mg/dL — ABNORMAL HIGH (ref 0.61–1.24)
GFR, Estimated: 57 mL/min — ABNORMAL LOW (ref >60)
Glucose, Bld: 83 mg/dL (ref 70–99)
Potassium: 4.6 mmol/L (ref 3.5–5.1)
Sodium: 128 mmol/L — ABNORMAL LOW (ref 135–145)

## 2022-02-10 LAB — MAGNESIUM: Magnesium: 1.8 mg/dL (ref 1.7–2.4)

## 2022-02-10 MED ORDER — LACTATED RINGERS IV SOLN: INTRAVENOUS | Status: DC

## 2022-02-10 MED ORDER — SALINE SPRAY 0.65 % NA SOLN
1.0000 | NASAL | Status: DC | PRN
  Administered 2022-02-10 (×2): 1 via NASAL
  Filled 2022-02-10: qty 44

## 2022-02-10 MED ORDER — HYDRALAZINE HCL 20 MG/ML IJ SOLN: 10.0000 mg | Freq: Four times a day (QID) | INTRAMUSCULAR | Status: DC | PRN

## 2022-02-10 MED ORDER — LACTATED RINGERS IV SOLN: INTRAVENOUS | Status: AC

## 2022-02-10 NOTE — Care Management Obs Status (Signed)
MEDICARE OBSERVATION STATUS NOTIFICATION ? ? ?Patient Details  ?Name: [name] ?MRN: [id number] ?Date of Birth: 04/23/1941 ? ? ?Medicare Observation Status Notification Given:  Yes ? ? ? ?Tom-Johnson, Renea Ee, RN ?02/10/2022, 9:07 AM ?

## 2022-02-10 NOTE — Progress Notes (Signed)
PROGRESS NOTE                                                                                                                                                                                                             Patient Demographics:    [name], is a 81 y.o. male, DOB - Mar 28, 1941, WUJ:[id number]  Outpatient Primary MD for the patient is Adrian Prince, MD    LOS - 0  Admit date - 02/08/2022    Chief Complaint  Patient presents with   Leg Pain       Brief Narrative (HPI from H&P)   81 y.o. male with history of hypertension and hyperlipidemia who was recently in the hospital 2 weeks ago for constipation was advised to take MiraLAX and drink adequate fluid comes to the ER complaining of lower extremity numbness which has been ongoing for the last 1 month.  Patient states he also has been having some balance issues with walking for the last 1 week.  Denies any nausea vomiting.  Has been having some sinus symptoms.  He states since he left hospital 2 weeks ago he has been drinking almost a gallon of water.  He last moved his bowels 3 days ago.  In the ER work-up suggestive of severe hyponatremia and he was admitted for further care.   Subjective:   Patient in bed, appears comfortable, denies any headache, no fever, no chest pain or pressure, no shortness of breath , no abdominal pain. No new focal weakness.  Chronic lower back pain ongoing for over 60 years, chronic numbness in lower extremities right more than left but no acute changes.   Assessment  & Plan :    Hyponatremia likely due to excessive free water intake.  Not primary polydipsia, he was drinking excessive water to relieve his constipation, also was on HCTZ.  Improved with fluid restriction.  Control free water intake, hold HCTZ, kindly see it below.  2.  Metabolic encephalopathy.  Likely due to #1 above.  Much improved with supportive care close to  baseline, minimize benzodiazepines and narcotics.  3.  Deconditioning, falls at home.  PT OT.  May require placement  4.  Hypertension.  Blood pressure soft, some intravascular depletion, hold blood pressure medications with as needed hydralazine and monitor.  5.  Dyslipidemia.  Continue fenofibrate.  6.  Constipation.  Placed on bowel regimen.  7.  Chronic lower extremity numbness and weakness.  Supportive care, no focal deficits.  Will reevaluate once #1 above is better, most likely outpatient neuro follow-up.  8. AKI - hold ARB, IVF.        Condition - Extremely Guarded  Family Communication  :    Wife [phone] - on 02/09/22  Code Status :  Full  Consults  :    PUD Prophylaxis :    Procedures  :     CT Head - Non acute      Disposition Plan  :    Status is: Observation  DVT Prophylaxis  :    enoxaparin (LOVENOX) injection 40 mg Start: 02/09/22 1600   Lab Results  Component Value Date   PLT 245 02/10/2022    Diet :  Diet Order             Diet Heart Room service appropriate? Yes; Fluid consistency: Thin  Diet effective now                    Inpatient Medications  Scheduled Meds:  docusate sodium  200 mg Oral BID   enoxaparin (LOVENOX) injection  40 mg Subcutaneous Q24H   fenofibrate  160 mg Oral Daily   polyethylene glycol  17 g Oral BID   Continuous Infusions:  lactated ringers 100 mL/hr at 02/10/22 0829   PRN Meds:.acetaminophen **OR** acetaminophen, hydrALAZINE, sodium chloride  Antibiotics  :    Anti-infectives (From admission, onward)    None        Time Spent in minutes  30   Susa Raring M.D on 02/10/2022 at 10:23 AM  To page go to [email or web address]   [facility] -  Office  [phone]  See all Orders from today for further details    Objective:   Vitals:   02/09/22 1427 02/09/22 2118 02/10/22 0357 02/10/22 0933  BP: 115/64 108/62 (!) 112/52 119/89  Pulse: (!) 54 (!) 54 (!) 49 (!) 46  Resp: 18 18  19 18   Temp: 98.1 F (36.7 C) (!) 97.3 F (36.3 C) 98.4 F (36.9 C) 98.3 F (36.8 C)  TempSrc: Oral Oral Oral   SpO2: 99% 99% 97% 97%  Weight: 73.1 kg     Height: 5\' 7"  (1.702 m)       Wt Readings from Last 3 Encounters:  02/09/22 73.1 kg  01/01/22 77.6 kg     Intake/Output Summary (Last 24 hours) at 02/10/2022 1023 Last data filed at 02/10/2022 0801 Gross per 24 hour  Intake 540 ml  Output --  Net 540 ml     Physical Exam  Awake Alert, No new F.N deficits, Normal affect [place].AT,PERRAL Supple Neck, No JVD,   Symmetrical Chest wall movement, Good air movement bilaterally, CTAB RRR,No Gallops, Rubs or new Murmurs,  +ve B.Sounds, Abd Soft, No tenderness,   No Cyanosis, Clubbing or edema        Data Review:    CBC Recent Labs  Lab 02/09/22 0002 02/09/22 0630 02/10/22 0643  WBC 9.1 8.5 7.2  HGB 12.9* 13.4 11.7*  HCT 36.1* 37.0* 32.9*  PLT 282 276 245  MCV 84.9 85.3 87.5  MCH 30.4 30.9 31.1  MCHC 35.7 36.2* 35.6  RDW 12.1 12.1 12.3  LYMPHSABS  --   --  1.5  MONOABS  --   --  0.6  EOSABS  --   --  0.1  BASOSABS  --   --  0.0    Electrolytes Recent Labs  Lab 02/09/22 0002 02/09/22 0630 02/09/22 1459 02/10/22 0643  NA 119* 122* 126* 128*  K 4.5 4.1 3.9 4.2  CL 86* 88* 92* 96*  CO2 23 23 25 26   GLUCOSE 123* 89 82 81  BUN 13 12 13 18   CREATININE 1.11 0.93 0.92 1.26*  CALCIUM 9.8 10.0 9.5 9.3  AST  --   --   --  35  ALT  --   --   --  25  ALKPHOS  --   --   --  42  BILITOT  --   --   --  0.7  ALBUMIN  --   --   --  3.4*  MG  --   --   --  1.8  TSH  --  5.549*  --   --     ------------------------------------------------------------------------------------------------------------------ No results for input(s): CHOL, HDL, LDLCALC, TRIG, CHOLHDL, LDLDIRECT in the last 72 hours.  No results found for: HGBA1C  Recent Labs    02/09/22 0630  TSH 5.549*     Micro Results No results found for this or any previous visit (from the past 240  hour(s)).  Radiology Reports DG Lumbar Spine 2-3 Views  Result Date: 02/09/2022 CLINICAL DATA:  81 year old male with pain and numbness in the lower extremities. Scoliosis. EXAM: LUMBAR SPINE - 2-3 VIEW COMPARISON:  CT Abdomen and Pelvis 01/25/2022. FINDINGS: Normal lumbar segmentation. Moderate dextroconvex upper lumbar scoliosis apex at L1-L2. 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 Superimposed chronic bilateral L5 pars fractures with grade 1 anterolisthesis at L5-S1. Stable vertebral height and alignment. No acute osseous abnormality identified. Aortoiliac calcified atherosclerosis. Negative visible bowel gas. Sacrum and SI joints appear negative. IMPRESSION: 1. No acute osseous abnormality identified in the lumbar spine. 2. 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 3. 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 4.  Aortic Atherosclerosis (ICD10-I70.0). Electronically Signed   By: Odessa Fleming M.D.   On: 02/09/2022 07:20   CT HEAD WO CONTRAST ( )  Result Date: 02/09/2022 CLINICAL DATA:  81 year old male with neurologic deficit. Pain and numbness in the lower extremities. Scoliosis. EXAM: CT HEAD WITHOUT CONTRAST TECHNIQUE: Contiguous axial images were obtained from the base of the skull through the vertex without intravenous contrast. RADIATION DOSE REDUCTION: This exam was performed according to the departmental dose-optimization program which includes automated exposure control, adjustment of the mA and/or kV according to patient size and/or use of iterative reconstruction technique. COMPARISON:  Brain MRI and Head CT 01/01/2022. FINDINGS: Brain: No midline shift, ventriculomegaly, mass effect, evidence of mass lesion, intracranial hemorrhage or evidence of cortically based acute  infarction. Mild to moderate for age scattered white matter hypodensity appears stable from last month. Vascular: Calcified atherosclerosis at the skull base. No suspicious intracranial vascular hyperdensity. Skull: No acute osseous abnormality identified. Sinuses/Orbits: Moderate paranasal sinus mucosal thickening sparing the right maxillary and sphenoids, mildly increased from last month. Tympanic cavities remain clear. Right mastoid effusion is chronic and stable. Other: No acute orbit or scalp soft tissue finding. IMPRESSION: 1. No acute intracranial abnormality. 2. Stable non contrast CT appearance of the brain since last month, mild to moderate for age white matter changes most commonly due to small vessel disease. 3. Paranasal sinus inflammation appears mildly progressed from last month. Electronically Signed  By: Odessa Fleming M.D.   On: 02/09/2022 07:24

## 2022-02-10 NOTE — Evaluation (Signed)
Occupational Therapy Evaluation ?Patient Details ?Name: [name] ?MRN: [id number] ?DOB: [birth year] ?Today's Date: 02/10/2022 ? ? ?History of Present Illness The pt is an 81 yo male presenting 4/29 with RLE numbness and difficulty urinating. He has also presented to ED on 3/22 for same concerns, MRI neg for acute stoke, and 4/15 with constipation. Pt found to have severe hyponatremia, placed on fluid restriction. Imaging of head and spine with no acute changes. PMH includes: HTN, lumbar scoliosis.  ? ?Clinical Impression ?  ?Pt is currently independent to modified independent for selfcare tasks and functional transfers.  He does exhibit increased trunk flexion secondary to scoliosis but this is baseline for the past few years.  No further acute or post acute OT needs at this time.    ?   ? ?Recommendations for follow up therapy are one component of a multi-disciplinary discharge planning process, led by the attending physician.  Recommendations may be updated based on patient status, additional functional criteria and insurance authorization.  ? ?Follow Up Recommendations ? No OT follow up  ?  ?Assistance Recommended at Discharge None  ?   ?Functional Status Assessment ? Patient has not had a recent decline in their functional status  ?Equipment Recommendations ? None recommended by OT  ?  ?Recommendations for Other Services   ? ? ?  ?Precautions / Restrictions Precautions ?Precautions: None ?Restrictions ?Weight Bearing Restrictions: No  ? ?  ? ?Mobility Bed Mobility ?Overal bed mobility: Independent ?  ?  ?  ?  ?  ?  ?  ?  ? ?Transfers ?Overall transfer level: Independent ?Equipment used: None ?Transfers: Sit to/from Stand ?Sit to Stand: Independent ?  ?  ?  ?  ?  ?General transfer comment: Pt with flexed posture in standing and with mobility resulting in limited mobility endurance ( approximately 50') without an assistive device.  Pt reports having a RW at home he could use if needed. ?  ? ?  ?Balance Overall  balance assessment: Modified Independent (for selfcare tasks) ?  ?  ?  ?  ?  ?  ?  ?  ?  ?  ?  ?  ?  ?  ?  ?  ?  ?  ?   ? ?ADL either performed or assessed with clinical judgement  ? ?ADL Overall ADL's : At baseline ?  ?  ?  ?  ?  ?  ?  ?  ?  ?  ?  ?  ?  ?  ?  ?  ?  ?  ?  ?General ADL Comments: Pt overall modified independent for simulated selfcare tasks including toilet transfers to regular height toilet and for completion of grooming tasks at the sink.  ? ? ? ?Vision Baseline Vision/History: 1 Wears glasses (for reading only) ?Ability to See in Adequate Light: 0 Adequate ?Patient Visual Report: No change from baseline ?Vision Assessment?: No apparent visual deficits  ?   ?Perception Perception ?Perception: Within Functional Limits ?  ?Praxis Praxis ?Praxis: Intact ?  ? ?Pertinent Vitals/Pain Pain Assessment ?Pain Assessment: (P) Faces ?Faces Pain Scale: (P) Hurts a little bit ?Pain Location: (P) lower back stiffness ?Pain Intervention(s): (P) Limited activity within patient's tolerance, Monitored during session  ? ? ? ?Hand Dominance Right ?  ?Extremity/Trunk Assessment Upper Extremity Assessment ?Upper Extremity Assessment: Overall WFL for tasks assessed ?  ?Lower Extremity Assessment ?Lower Extremity Assessment: Defer to PT evaluation ?  ?Cervical / Trunk Assessment ?Cervical / Trunk Assessment: Kyphotic ?Cervical /  Trunk Exceptions: History of scoliosis with flexed posture in standing ?  ?Communication Communication ?Communication: No difficulties ?  ?Cognition Arousal/Alertness: Awake/alert ?Behavior During Therapy: [facility] for tasks assessed/performed ?Overall Cognitive Status: Within Functional Limits for tasks assessed ?  ?  ?  ?  ?  ?  ?  ?  ?  ?  ?  ?  ?  ?  ?  ?  ?  ?  ?  ?   ?   ?Shoulder Instructions    ? ? ?Home Living Family/patient expects to be discharged to:: Private residence ?Living Arrangements: Spouse/significant other ?Available Help at Discharge: Family;Available 24 hours/day ?Type of Home:  House ?Home Access: Stairs to enter ?Entrance Stairs-Number of Steps: 4 ?Entrance Stairs-Rails: None ?Home Layout: One level ?  ?  ?Bathroom Shower/Tub: Tub/shower unit ?  ?Bathroom Toilet: Handicapped height ?Bathroom Accessibility: Yes ?  ?Home Equipment: Shower seat;Grab bars - tub/shower ?  ?  ?  ? ?  ?Prior Functioning/Environment Prior Level of Function : Driving;History of Falls (last six months) (one fall in last 6 months) ?  ?  ?  ?  ?  ?  ?Mobility Comments: pt reports independence without DME, single fall in last 6 months ?ADLs Comments: pt reports no use of shower seat, independent with ADLs and IADLs ?  ? ?  ?  ?   ?   ?   ?   ?   ? ?AM-PAC OT "6 Clicks" Daily Activity     ?Outcome Measure Help from another person eating meals?: None ?Help from another person taking care of personal grooming?: None ?Help from another person toileting, which includes using toliet, bedpan, or urinal?: None ?Help from another person bathing (including washing, rinsing, drying)?: None ?Help from another person to put on and taking off regular upper body clothing?: None ?Help from another person to put on and taking off regular lower body clothing?: None ?6 Click Score: 24 ?  ?End of Session Equipment Utilized During Treatment: Gait belt ?Nurse Communication: Mobility status ? ?Activity Tolerance: Patient tolerated treatment well ?Patient left: in bed;with call bell/phone within reach;with nursing/sitter in room ? ?   ?              ?Time: [phone] ?OT Time Calculation (min): 32 min ?Charges:  OT General Charges ?$OT Visit: 1 Visit ?OT Evaluation ?$OT Eval Low Complexity: 1 Low ?OT Treatments ?$Self Care/Home Management : 8-22 mins ? ?Chirsty Armistead OTR/L ?02/10/2022, 9:39 AM ?

## 2022-02-11 DIAGNOSIS — E871 Hypo-osmolality and hyponatremia: Secondary | ICD-10-CM | POA: Diagnosis not present

## 2022-02-11 LAB — COMPREHENSIVE METABOLIC PANEL
ALT: 23 U/L (ref 0–44)
AST: 29 U/L (ref 15–41)
Albumin: 3.3 g/dL — ABNORMAL LOW (ref 3.5–5.0)
Alkaline Phosphatase: 38 U/L (ref 38–126)
Anion gap: 5 (ref 5–15)
BUN: 15 mg/dL (ref 8–23)
CO2: 25 mmol/L (ref 22–32)
Calcium: 9.1 mg/dL (ref 8.9–10.3)
Chloride: 97 mmol/L — ABNORMAL LOW (ref 98–111)
Creatinine, Ser: 1.13 mg/dL (ref 0.61–1.24)
GFR, Estimated: >60 mL/min (ref >60)
Glucose, Bld: 84 mg/dL (ref 70–99)
Potassium: 4.1 mmol/L (ref 3.5–5.1)
Sodium: 127 mmol/L — ABNORMAL LOW (ref 135–145)
Total Bilirubin: 0.4 mg/dL (ref 0.3–1.2)
Total Protein: 5.6 g/dL — ABNORMAL LOW (ref 6.5–8.1)

## 2022-02-11 LAB — CBC WITH DIFFERENTIAL/PLATELET
Abs Immature Granulocytes: 0.02 10*3/uL (ref 0.00–0.07)
Basophils Absolute: 0 10*3/uL (ref 0.0–0.1)
Basophils Relative: 0 %
Eosinophils Absolute: 0.2 10*3/uL (ref 0.0–0.5)
Eosinophils Relative: 2 %
HCT: 34 % — ABNORMAL LOW (ref 39.0–52.0)
Hemoglobin: 11.7 g/dL — ABNORMAL LOW (ref 13.0–17.0)
Immature Granulocytes: 0 %
Lymphocytes Relative: 25 %
Lymphs Abs: 1.9 10*3/uL (ref 0.7–4.0)
MCH: 30 pg (ref 26.0–34.0)
MCHC: 34.4 g/dL (ref 30.0–36.0)
MCV: 87.2 fL (ref 80.0–100.0)
Monocytes Absolute: 0.7 10*3/uL (ref 0.1–1.0)
Monocytes Relative: 9 %
Neutro Abs: 4.8 10*3/uL (ref 1.7–7.7)
Neutrophils Relative %: 64 %
Platelets: 268 10*3/uL (ref 150–400)
RBC: 3.9 MIL/uL — ABNORMAL LOW (ref 4.22–5.81)
RDW: 12.4 % (ref 11.5–15.5)
WBC: 7.6 10*3/uL (ref 4.0–10.5)
nRBC: 0 % (ref 0.0–0.2)

## 2022-02-11 LAB — MAGNESIUM: Magnesium: 1.7 mg/dL (ref 1.7–2.4)

## 2022-02-11 MED ORDER — AMLODIPINE BESYLATE 5 MG PO TABS: 5.0000 mg | ORAL_TABLET | Freq: Every day | ORAL | 0 refills | Status: DC

## 2022-02-11 MED ORDER — SODIUM CHLORIDE 1 G PO TABS
2.0000 g | ORAL_TABLET | Freq: Once | ORAL | Status: AC
  Administered 2022-02-11: 2 g via ORAL
  Filled 2022-02-11: qty 2

## 2022-02-11 NOTE — TOC Transition Note (Signed)
Transition of Care (TOC) - CM/SW Discharge Note ? ? ?Patient Details  ?Name: [name] ?MRN: [id number] ?Date of Birth: [birth year] ? ?Transition of Care (TOC) CM/SW Contact:  ?Tom-Johnson, Renea Ee, RN ?Phone Number: ?02/11/2022, 8:25 AM ? ? ?Clinical Narrative:    ? ?Patient is scheduled for discharge today. Admitted for Hyponatremia. Patient states he lives at home with his wife. Has two living children and very supportive with care. Independent with care and drive self prior to admission. Has a cane, walker, wheelchair, shower seat at home. ?PCP is [name], MD and uses [facility] on [address]. ?PT recommended outpatient PT and patient states he will do outpatient if he needs it after going home and evaluating what he can and cannot do. CM encouraged him and notified him that order is on his AVS and whenever he is ready, he can call facility to schedule appointment.  ?Family to transport at discharge. No further TOC needs noted.   ? ? ?Final next level of care: OP Rehab ?Barriers to Discharge: Barriers Resolved ? ? ?Patient Goals and CMS Choice ?Patient states their goals for this hospitalization and ongoing recovery are:: To return home ?CMS Medicare.gov Compare Post Acute Care list provided to:: Patient ?Choice offered to / list presented to : Patient ? ?Discharge Placement ?  ?           ?  ?Patient to be transferred to facility by: Family ?  ?  ? ?Discharge Plan and Services ?  ?  ?           ?DME Arranged: N/A ?DME Agency: NA ?  ?  ?  ?HH Arranged: NA ?[place] Agency: NA ?  ?  ?  ? ?Social Determinants of Health (SDOH) Interventions ?  ? ? ?Readmission Risk Interventions ?   ? View : No data to display.  ?  ?  ?  ? ? ? ? ? ?

## 2022-02-11 NOTE — Plan of Care (Signed)
  Problem: Activity: Goal: Risk for activity intolerance will decrease Outcome: Progressing   

## 2022-02-11 NOTE — Discharge Instructions (Signed)
Follow with Primary MD David Bowen, MD in 7 days  ? ?Get CBC, CMP, 2 view Chest X ray -  checked next visit within 1 week by Primary MD  ? ?Activity: As tolerated with Full fall precautions use walker/cane & assistance as needed ? ?Disposition Home  ? ?Diet: Heart Healthy - strict 1.5 Lit per day total fluid restriction ? ?Special Instructions: If you have smoked or chewed Tobacco  in the last 2 yrs please stop smoking, stop any regular Alcohol  and or any Recreational drug use. ? ?On your next visit with your primary care physician please Get Medicines reviewed and adjusted. ? ?Please request your Prim.MD to go over all Hospital Tests and Procedure/Radiological results at the follow up, please get all Hospital records sent to your Prim MD by signing hospital release before you go home. ? ?If you experience worsening of your admission symptoms, develop shortness of breath, life threatening emergency, suicidal or homicidal thoughts you must seek medical attention immediately by calling 911 or calling your MD immediately  if symptoms less severe. ? ?You Must read complete instructions/literature along with all the possible adverse reactions/side effects for all the Medicines you take and that have been prescribed to you. Take any new Medicines after you have completely understood and accpet all the possible adverse reactions/side effects.  ? ?  ?

## 2022-02-11 NOTE — Progress Notes (Signed)
DISCHARGE NOTE HOME ?[name] to be discharged Home per MD order. Discussed prescriptions and follow up appointments with the patient. Prescriptions given to patient; medication list explained in detail. Patient verbalized understanding. ? ?Skin clean, dry and intact without evidence of skin break down, no evidence of skin tears noted. IV catheter discontinued intact. Site without signs and symptoms of complications. Dressing and pressure applied. Pt denies pain at the site currently. No complaints noted. ? ?Patient free of lines, drains, and wounds.  ? ?An After Visit Summary (AVS) was printed and given to the patient. ?Patient escorted via wheelchair, and discharged home via private auto. ? ?Ellwood Sayers, RN  ?

## 2022-02-11 NOTE — Discharge Summary (Signed)
?                                                                                ? [name] QBH:[id number] DOB: [birth year] DOA: 02/08/2022 ? ?PCP: [name], MD ? ?Admit date: 02/08/2022  Discharge date: 02/11/2022 ? ?Admitted From: Home   Disposition:  Home ? ? ?Recommendations for Outpatient Follow-up:  ? ?Follow up with PCP in 1-2 weeks ? ?PCP Please obtain BMP/CBC, 2 view CXR in 1week,  (see Discharge instructions)  ? ?PCP Please follow up on the following pending results: monitor BMP and fluid intake closely ? ? ?Home Health: PT if qualifies   ?Equipment/Devices: walker ?Consultations: None  ?Discharge Condition: Stable    ?CODE STATUS: Full    ?Diet Recommendation: Heart Healthy with strict 1.5 L fluid restriction per day ?  ? ?Chief Complaint  ?Patient presents with  ? Leg Pain  ?  ? ?Brief history of present illness from the day of admission and additional interim summary   ? ?81 y.o. male with history of hypertension and hyperlipidemia who was recently in the hospital 2 weeks ago for constipation was advised to take MiraLAX and drink adequate fluid comes to the ER complaining of lower extremity numbness which has been ongoing for the last 1 month.  Patient states he also has been having some balance issues with walking for the last 1 week.  Denies any nausea vomiting.  Has been having some sinus symptoms.  He states since he left hospital 2 weeks ago he has been drinking almost a gallon of water.  He last moved his bowels 3 days ago. ?  ?In the ER work-up suggestive of severe hyponatremia and he was admitted for further care. ? ?                                                               Hospital Course  ? ?Hyponatremia likely due to excessive free water intake.  Not primary polydipsia, he was drinking excessive water to relieve his constipation, also was on HCTZ.  Improved with fluid restriction. Feels back to  baseline, placed on fluid restriction, counseled, PCP to monitor closely. ?  ?2.  Metabolic encephalopathy.  Likely due to #1 above. Resolved. ?  ?3.  Deconditioning, falls at home.  PT OT.  May require placement ?  ?4.  Hypertension.  Blood pressure stable, on Norvasc only. ?  ?5.  Dyslipidemia.  Continue fenofibrate. ?  ?6.  Constipation.  Placed on bowel regimen and resolved. ?  ?7.  Chronic lower extremity numbness and weakness.  stable, PCP to monitor, did well with PT and OT, HHPT at home if qualifies, PCP to monitor. ?  ?8. AKI - hold ARB, resolved - PCP to monitor.  ? ? ?Discharge diagnosis   ? ? ?Principal Problem: ?  Hyponatremia ?Active Problems: ?  Essential hypertension ? ? ? ?Discharge instructions   ? ?Discharge Instructions   ? ? Discharge instructions  Complete by: As directed ?  ? Follow with Primary MD [name], MD in 7 days  ? ?Get CBC, CMP, 2 view Chest X ray -  checked next visit within 1 week by Primary MD  ? ?Activity: As tolerated with Full fall precautions use walker/cane & assistance as needed ? ?Disposition Home  ? ?Diet: Heart Healthy - strict 1.5 Lit per day total fluid restriction ? ?Special Instructions: If you have smoked or chewed Tobacco  in the last 2 yrs please stop smoking, stop any regular Alcohol  and or any Recreational drug use. ? ?On your next visit with your primary care physician please Get Medicines reviewed and adjusted. ? ?Please request your Prim.MD to go over all Hospital Tests and Procedure/Radiological results at the follow up, please get all Hospital records sent to your Prim MD by signing hospital release before you go home. ? ?If you experience worsening of your admission symptoms, develop shortness of breath, life threatening emergency, suicidal or homicidal thoughts you must seek medical attention immediately by calling 911 or calling your MD immediately  if symptoms less severe. ? ?You Must read complete instructions/literature along with all the  possible adverse reactions/side effects for all the Medicines you take and that have been prescribed to you. Take any new Medicines after you have completely understood and accpet all the possible adverse reactions/side effects.  ? Increase activity slowly   Complete by: As directed ?  ? ?  ? ? ?Discharge Medications  ? ?Allergies as of 02/11/2022   ?No Known Allergies ?  ? ?  ?Medication List  ?  ? ?STOP taking these medications   ? ?irbesartan-hydrochlorothiazide 300-12.5 MG tablet ?Commonly known as: AVALIDE ?  ? ?  ? ?TAKE these medications   ? ?amLODipine 5 MG tablet ?Commonly known as: NORVASC ?Take 1 tablet (5 mg total) by mouth daily. ?What changed:  ?medication strength ?how much to take ?  ?carboxymethylcellulose 0.5 % Soln ?Commonly known as: REFRESH PLUS ?Place 1 drop into both eyes daily as needed (dry eyes). ?  ?docusate sodium 100 MG capsule ?Commonly known as: COLACE ?Take 200 mg by mouth 2 (two) times daily as needed for mild constipation. ?  ?fenofibrate 160 MG tablet ?Take 160 mg by mouth daily. ?  ?fluticasone 50 MCG/ACT nasal spray ?Commonly known as: FLONASE ?Place 2 sprays into both nostrils daily as needed for allergies. ?  ?hydrocortisone 2.5 % rectal cream ?Commonly known as: ANUSOL-HC ?Place 1 application. rectally 2 (two) times daily as needed for hemorrhoids. ?  ?loratadine 10 MG tablet ?Commonly known as: CLARITIN ?Take 10 mg by mouth daily as needed for allergies. ?  ?neomycin-polymyxin b-dexamethasone 3.5-10000-0.1 Susp ?Commonly known as: MAXITROL ?1 drop daily as needed (itchy eyes). ?  ?polyethylene glycol 17 g packet ?Commonly known as: MIRALAX / GLYCOLAX ?Take 17 g by mouth 2 (two) times daily as needed for mild constipation. ?  ? ?  ? ?  ?  ? ? ?  ?Durable Medical Equipment  ?(From admission, onward)  ?  ? ? ?  ? ?  Start     Ordered  ? 02/11/22 0808  For home use only DME Walker rolling  Once       ?Comments: 5 wheel  ?Question Answer Comment  ?Walker: With 5 Inch Wheels    ?Patient needs a walker to treat with the following condition Weakness   ?  ? 02/11/22 0807  ? ?  ?  ? ?  ? ? ? ? ?  Major procedures and Radiology Reports - PLEASE review detailed and final reports thoroughly  -    ? ?  ?DG Lumbar Spine 2-3 Views ? ?Result Date: 02/09/2022 ?CLINICAL DATA:  81 year old male with pain and numbness in the lower extremities. Scoliosis. EXAM: LUMBAR SPINE - 2-3 VIEW COMPARISON:  CT Abdomen and Pelvis 01/25/2022. FINDINGS: Normal lumbar segmentation. Moderate dextroconvex upper lumbar scoliosis apex at L1-L2. 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 Superimposed chronic bilateral L5 pars fractures with grade 1 anterolisthesis at L5-S1. Stable vertebral height and alignment. No acute osseous abnormality identified. Aortoiliac calcified atherosclerosis. Negative visible bowel gas. Sacrum and SI joints appear negative. IMPRESSION: 1. No acute osseous abnormality identified in the lumbar spine. 2. 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 3. 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 4.  Aortic Atherosclerosis (ICD10-I70.0). Electronically Signed   By: Genevie Ann M.D.   On: 02/09/2022 07:20  ? ?CT HEAD WO CONTRAST (5MM) ? ?Result Date: 02/09/2022 ?CLINICAL DATA:  81 year old male with neurologic deficit. Pain and numbness in the lower extremities. Scoliosis. EXAM: CT HEAD WITHOUT CONTRAST TECHNIQUE: Contiguous axial images were obtained from the base of the skull through the vertex without intravenous contrast. RADIATION DOSE REDUCTION: This exam was performed according to the departmental dose-optimization program which includes automated exposure control, adjustment of the mA and/or kV according to patient size and/or use of iterative reconstruction technique. COMPARISON:  Brain  MRI and Head CT 01/01/2022. FINDINGS: Brain: No midline shift, ventriculomegaly, mass effect, evidence of mass lesion, intracranial hemorrhage or evidence of cortically based acute infarction. Mild to moderate fo

## 2022-02-11 NOTE — Progress Notes (Signed)
Physical Therapy Treatment ?Patient Details ?Name: [name] ?MRN: [id number] ?DOB: 1941/03/28 ?Today's Date: 02/11/2022 ? ? ?History of Present Illness The pt is an 81 yo male presenting 4/29 with RLE numbness and difficulty urinating. He has also presented to ED on 3/22 for same concerns, MRI neg for acute stoke, and 4/15 with constipation. Pt found to have severe hyponatremia, placed on fluid restriction. Imaging of head and spine with no acute changes. PMH includes: HTN, lumbar scoliosis. ? ?  ?PT Comments  ? ? Patient received sitting edge of bed dressed in street clothes. He is able to stand independently, ambulates around room independently. He washed face at sink then ambulated 150 feet without ad and supervision. Ambulates with flexed trunk posture due to scoliosis and reports this is baseline. Patient is awaiting discharge home.  ?   ?Recommendations for follow up therapy are one component of a multi-disciplinary discharge planning process, led by the attending physician.  Recommendations may be updated based on patient status, additional functional criteria and insurance authorization. ? ?Follow Up Recommendations ? Outpatient PT ?  ?  ?Assistance Recommended at Discharge Intermittent Supervision/Assistance  ?Patient can return home with the following Help with stairs or ramp for entrance ?  ?Equipment Recommendations ? None recommended by PT  ?  ?Recommendations for Other Services   ? ? ?  ?Precautions / Restrictions Precautions ?Precautions: None ?Restrictions ?Weight Bearing Restrictions: No  ?  ? ?Mobility ? Bed Mobility ?Overal bed mobility: Independent ?  ?  ?  ?  ?  ?  ?  ?  ? ?Transfers ?Overall transfer level: Independent ?Equipment used: None ?Transfers: Sit to/from Stand ?Sit to Stand: Independent ?  ?  ?  ?  ?  ?  ?  ? ?Ambulation/Gait ?Ambulation/Gait assistance: Supervision ?Gait Distance (Feet): 150 Feet ?Assistive device: None ?Gait Pattern/deviations: Step-through pattern, Trunk flexed ?Gait  velocity: WFL ?  ?  ?General Gait Details: pt with significant trunk flexion which he reports is baseline due to scoliosis. no overt LOB ? ? ?Stairs ?  ?  ?  ?  ?  ? ? ?Wheelchair Mobility ?  ? ?Modified Rankin (Stroke Patients Only) ?  ? ? ?  ?Balance Overall balance assessment: Modified Independent ?  ?  ?  ?  ?  ?  ?  ?  ?  ?  ?  ?  ?  ?  ?  ?  ?  ?  ?  ? ?  ?Cognition Arousal/Alertness: Awake/alert ?Behavior During Therapy: [facility] for tasks assessed/performed ?Overall Cognitive Status: Within Functional Limits for tasks assessed ?  ?  ?  ?  ?  ?  ?  ?  ?  ?  ?  ?  ?  ?  ?  ?  ?  ?  ?  ? ?  ?Exercises   ? ?  ?General Comments   ?  ?  ? ?Pertinent Vitals/Pain Pain Assessment ?Pain Assessment: No/denies pain  ? ? ?Home Living   ?  ?  ?  ?  ?  ?  ?  ?  ?  ?   ?  ?Prior Function    ?  ?  ?   ? ?PT Goals (current goals can now be found in the care plan section) Acute Rehab PT Goals ?Patient Stated Goal: maintain independence ?PT Goal Formulation: With patient ?Time For Goal Achievement: 02/23/22 ?Potential to Achieve Goals: Good ?Progress towards PT goals: Progressing toward goals ? ?  ?Frequency ? ? ?  Min 3X/week ? ? ? ?  ?PT Plan Current plan remains appropriate  ? ? ?Co-evaluation   ?  ?  ?  ?  ? ?  ?AM-PAC PT "6 Clicks" Mobility   ?Outcome Measure ? Help needed turning from your back to your side while in a flat bed without using bedrails?: None ?Help needed moving from lying on your back to sitting on the side of a flat bed without using bedrails?: None ?Help needed moving to and from a bed to a chair (including a wheelchair)?: None ?Help needed standing up from a chair using your arms (e.g., wheelchair or bedside chair)?: None ?Help needed to walk in hospital room?: A Little ?Help needed climbing 3-5 steps with a railing? : A Little ?6 Click Score: 22 ? ?  ?End of Session   ?Activity Tolerance: Patient tolerated treatment well ?Patient left: Other (comment) (sitting up on side of bed) ?Nurse Communication: Mobility  status;Other (comment) (he is dressed and ready to go) ?PT Visit Diagnosis: Other abnormalities of gait and mobility (R26.89);Muscle weakness (generalized) (M62.81) ?  ? ? ?Time: 1157-2620 ?PT Time Calculation (min) (ACUTE ONLY): 21 min ? ?Charges:  $Gait Training: 8-22 mins          ?          ? ?[employer], PT, [employer] ?02/11/22,10:29 AM ? ? ?

## 2022-02-13 DIAGNOSIS — I2510 Atherosclerotic heart disease of native coronary artery without angina pectoris: Secondary | ICD-10-CM | POA: Diagnosis not present

## 2022-02-13 DIAGNOSIS — R002 Palpitations: Secondary | ICD-10-CM | POA: Diagnosis not present

## 2022-02-13 DIAGNOSIS — E119 Type 2 diabetes mellitus without complications: Secondary | ICD-10-CM | POA: Diagnosis not present

## 2022-02-13 DIAGNOSIS — G4730 Sleep apnea, unspecified: Secondary | ICD-10-CM | POA: Diagnosis not present

## 2022-02-13 DIAGNOSIS — I10 Essential (primary) hypertension: Secondary | ICD-10-CM | POA: Diagnosis not present

## 2022-02-13 DIAGNOSIS — K5900 Constipation, unspecified: Secondary | ICD-10-CM | POA: Diagnosis not present

## 2022-02-13 DIAGNOSIS — E871 Hypo-osmolality and hyponatremia: Secondary | ICD-10-CM | POA: Diagnosis not present

## 2022-02-17 DIAGNOSIS — E1129 Type 2 diabetes mellitus with other diabetic kidney complication: Secondary | ICD-10-CM | POA: Diagnosis not present

## 2022-02-17 DIAGNOSIS — I2510 Atherosclerotic heart disease of native coronary artery without angina pectoris: Secondary | ICD-10-CM | POA: Diagnosis not present

## 2022-02-17 DIAGNOSIS — F319 Bipolar disorder, unspecified: Secondary | ICD-10-CM | POA: Diagnosis not present

## 2022-02-17 DIAGNOSIS — E113553 Type 2 diabetes mellitus with stable proliferative diabetic retinopathy, bilateral: Secondary | ICD-10-CM | POA: Diagnosis not present

## 2022-02-17 DIAGNOSIS — I10 Essential (primary) hypertension: Secondary | ICD-10-CM | POA: Diagnosis not present

## 2022-02-17 DIAGNOSIS — R946 Abnormal results of thyroid function studies: Secondary | ICD-10-CM | POA: Diagnosis not present

## 2022-02-17 DIAGNOSIS — N1831 Chronic kidney disease, stage 3a: Secondary | ICD-10-CM | POA: Diagnosis not present

## 2022-02-17 DIAGNOSIS — M5416 Radiculopathy, lumbar region: Secondary | ICD-10-CM | POA: Diagnosis not present

## 2022-02-17 DIAGNOSIS — E871 Hypo-osmolality and hyponatremia: Secondary | ICD-10-CM | POA: Diagnosis not present

## 2022-05-02 DIAGNOSIS — S0501XA Injury of conjunctiva and corneal abrasion without foreign body, right eye, initial encounter: Secondary | ICD-10-CM | POA: Diagnosis not present

## 2022-05-06 DIAGNOSIS — S0502XD Injury of conjunctiva and corneal abrasion without foreign body, left eye, subsequent encounter: Secondary | ICD-10-CM | POA: Diagnosis not present

## 2022-05-12 DIAGNOSIS — H10023 Other mucopurulent conjunctivitis, bilateral: Secondary | ICD-10-CM | POA: Diagnosis not present

## 2022-06-23 DIAGNOSIS — E785 Hyperlipidemia, unspecified: Secondary | ICD-10-CM | POA: Diagnosis not present

## 2022-06-23 DIAGNOSIS — E1129 Type 2 diabetes mellitus with other diabetic kidney complication: Secondary | ICD-10-CM | POA: Diagnosis not present

## 2022-06-23 DIAGNOSIS — K760 Fatty (change of) liver, not elsewhere classified: Secondary | ICD-10-CM | POA: Diagnosis not present

## 2022-06-23 DIAGNOSIS — E871 Hypo-osmolality and hyponatremia: Secondary | ICD-10-CM | POA: Diagnosis not present

## 2022-06-23 DIAGNOSIS — M5416 Radiculopathy, lumbar region: Secondary | ICD-10-CM | POA: Diagnosis not present

## 2022-06-23 DIAGNOSIS — I10 Essential (primary) hypertension: Secondary | ICD-10-CM | POA: Diagnosis not present

## 2022-06-23 DIAGNOSIS — N1831 Chronic kidney disease, stage 3a: Secondary | ICD-10-CM | POA: Diagnosis not present

## 2022-06-23 DIAGNOSIS — E113553 Type 2 diabetes mellitus with stable proliferative diabetic retinopathy, bilateral: Secondary | ICD-10-CM | POA: Diagnosis not present

## 2022-06-23 DIAGNOSIS — I2510 Atherosclerotic heart disease of native coronary artery without angina pectoris: Secondary | ICD-10-CM | POA: Diagnosis not present

## 2022-10-20 DIAGNOSIS — H02052 Trichiasis without entropian right lower eyelid: Secondary | ICD-10-CM | POA: Diagnosis not present

## 2022-10-24 DIAGNOSIS — E871 Hypo-osmolality and hyponatremia: Secondary | ICD-10-CM | POA: Diagnosis not present

## 2022-10-24 DIAGNOSIS — E785 Hyperlipidemia, unspecified: Secondary | ICD-10-CM | POA: Diagnosis not present

## 2022-10-24 DIAGNOSIS — E1129 Type 2 diabetes mellitus with other diabetic kidney complication: Secondary | ICD-10-CM | POA: Diagnosis not present

## 2022-10-24 DIAGNOSIS — E1122 Type 2 diabetes mellitus with diabetic chronic kidney disease: Secondary | ICD-10-CM | POA: Diagnosis not present

## 2022-10-24 DIAGNOSIS — K760 Fatty (change of) liver, not elsewhere classified: Secondary | ICD-10-CM | POA: Diagnosis not present

## 2022-10-24 DIAGNOSIS — I10 Essential (primary) hypertension: Secondary | ICD-10-CM | POA: Diagnosis not present

## 2022-10-24 DIAGNOSIS — E113553 Type 2 diabetes mellitus with stable proliferative diabetic retinopathy, bilateral: Secondary | ICD-10-CM | POA: Diagnosis not present

## 2022-10-24 DIAGNOSIS — M5416 Radiculopathy, lumbar region: Secondary | ICD-10-CM | POA: Diagnosis not present

## 2022-10-24 DIAGNOSIS — F319 Bipolar disorder, unspecified: Secondary | ICD-10-CM | POA: Diagnosis not present

## 2022-10-24 DIAGNOSIS — H02132 Senile ectropion of right lower eyelid: Secondary | ICD-10-CM | POA: Diagnosis not present

## 2022-10-24 DIAGNOSIS — I712 Thoracic aortic aneurysm, without rupture, unspecified: Secondary | ICD-10-CM | POA: Diagnosis not present

## 2022-10-24 DIAGNOSIS — I2510 Atherosclerotic heart disease of native coronary artery without angina pectoris: Secondary | ICD-10-CM | POA: Diagnosis not present

## 2022-10-24 DIAGNOSIS — N1831 Chronic kidney disease, stage 3a: Secondary | ICD-10-CM | POA: Diagnosis not present

## 2022-10-24 DIAGNOSIS — H5711 Ocular pain, right eye: Secondary | ICD-10-CM | POA: Diagnosis not present

## 2022-11-03 DIAGNOSIS — S0501XA Injury of conjunctiva and corneal abrasion without foreign body, right eye, initial encounter: Secondary | ICD-10-CM | POA: Diagnosis not present

## 2022-11-03 DIAGNOSIS — S0502XA Injury of conjunctiva and corneal abrasion without foreign body, left eye, initial encounter: Secondary | ICD-10-CM | POA: Diagnosis not present

## 2023-01-27 DIAGNOSIS — S0502XA Injury of conjunctiva and corneal abrasion without foreign body, left eye, initial encounter: Secondary | ICD-10-CM | POA: Diagnosis not present

## 2023-02-12 DIAGNOSIS — R42 Dizziness and giddiness: Secondary | ICD-10-CM | POA: Diagnosis not present

## 2023-02-12 DIAGNOSIS — E871 Hypo-osmolality and hyponatremia: Secondary | ICD-10-CM | POA: Diagnosis not present

## 2023-02-12 DIAGNOSIS — E1129 Type 2 diabetes mellitus with other diabetic kidney complication: Secondary | ICD-10-CM | POA: Diagnosis not present

## 2023-02-12 DIAGNOSIS — N1831 Chronic kidney disease, stage 3a: Secondary | ICD-10-CM | POA: Diagnosis not present

## 2023-02-12 DIAGNOSIS — I2510 Atherosclerotic heart disease of native coronary artery without angina pectoris: Secondary | ICD-10-CM | POA: Diagnosis not present

## 2023-02-12 DIAGNOSIS — I129 Hypertensive chronic kidney disease with stage 1 through stage 4 chronic kidney disease, or unspecified chronic kidney disease: Secondary | ICD-10-CM | POA: Diagnosis not present

## 2023-09-12 IMAGING — CT CT HEAD W/O CM
4 of 5 series · 15 of 47 positions shown, 17 images · non-contrast
Comparison: January 03, 2005

CLINICAL DATA: Numbness



[Series 3: head without · axial · non-contrast · 0.41mm/px · z∈[-84,+11]mm · 4 of 33 slices shown]
[im 7/33  brain]
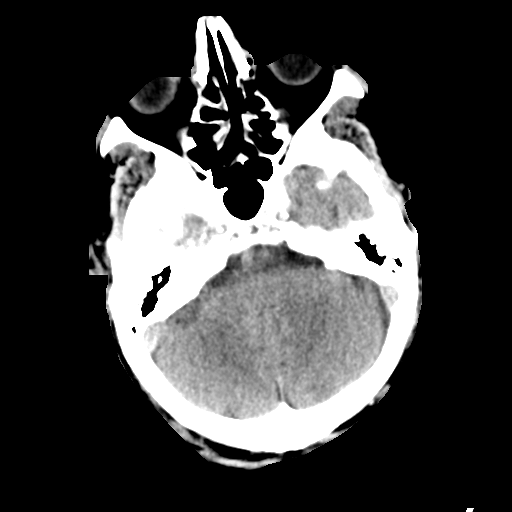
[im 13/33  brain]
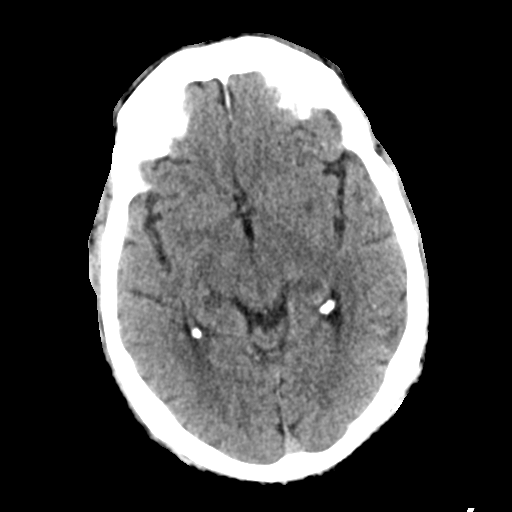
[im 20/33  brain]
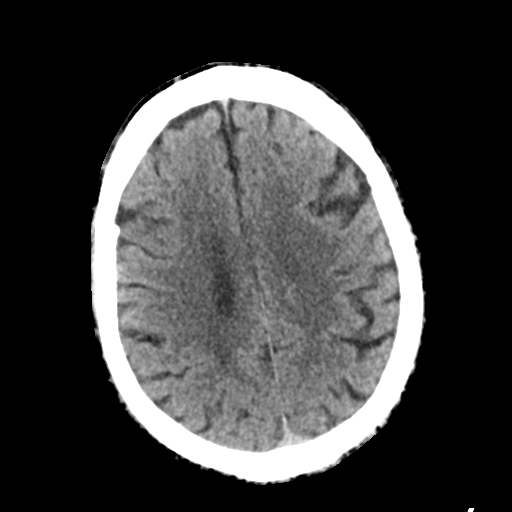
[im 26/33  brain]
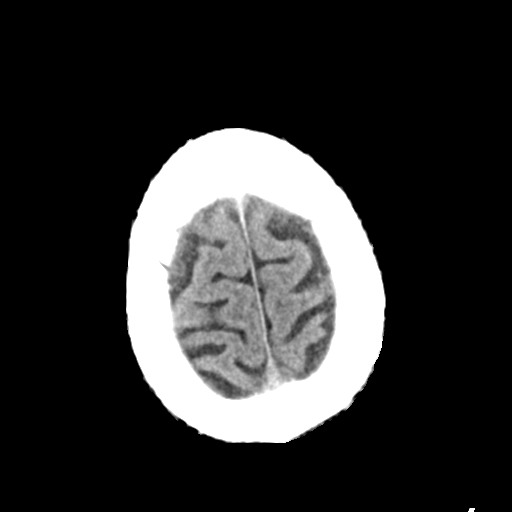

[Series 5: head without cor · coronal · non-contrast · 0.32mm/px · 3 of 67 slices shown]
[im 23/67  brain]
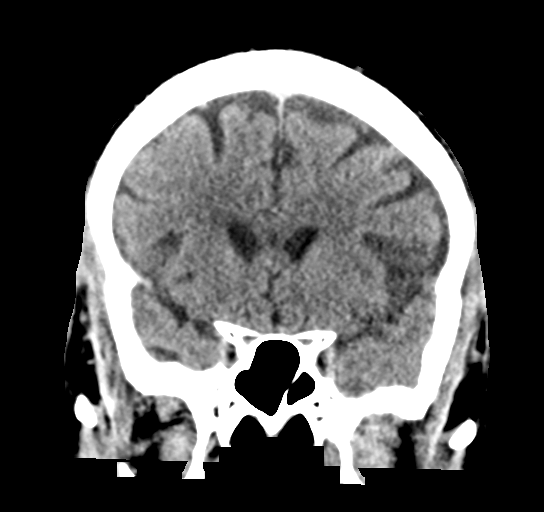
[im 30/67  brain]
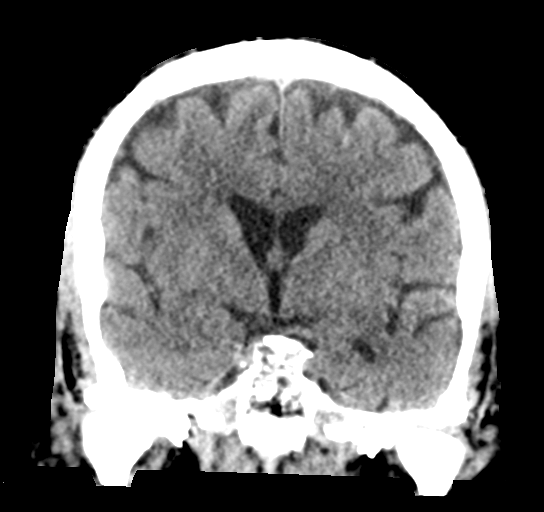
[im 37/67  brain]
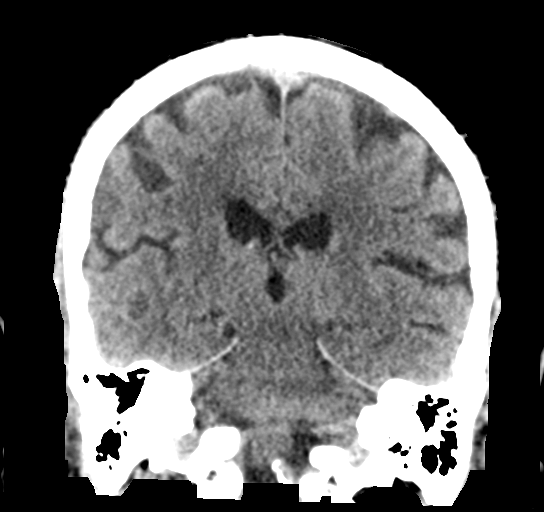

[Series 6: head without sag · sagittal · non-contrast · 0.32mm/px · 3 of 56 slices shown]
[im 19/56  brain]
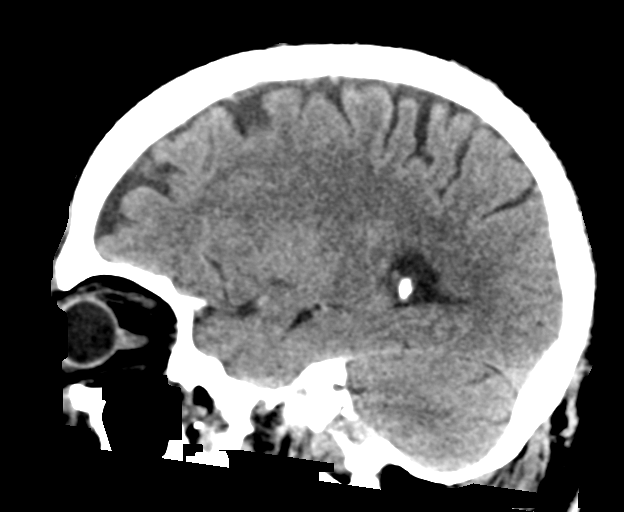
[im 28/56  brain]
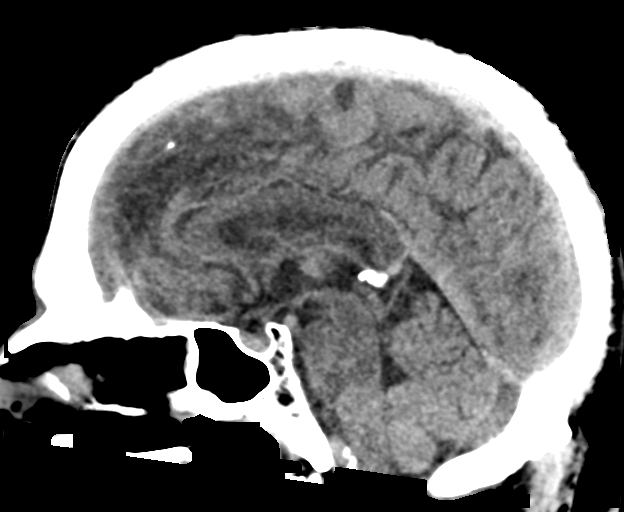
[im 37/56  brain]
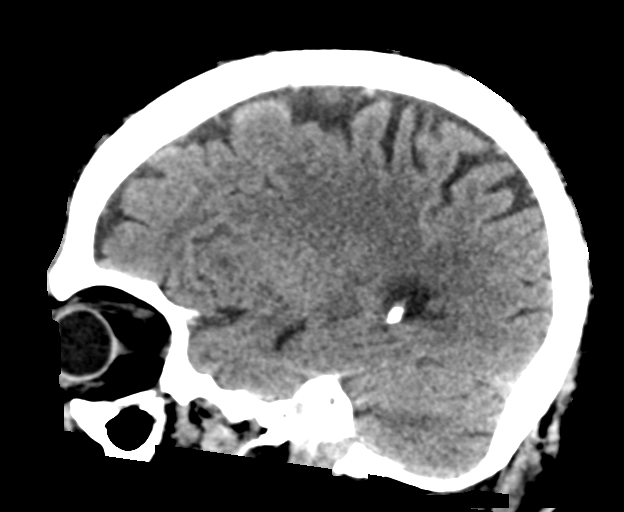

[Series 7: head without ax · axial · non-contrast · 0.41mm/px · z∈[-90,+9]mm · 5 of 32 slices shown, 7 images]
[im 6/32  brain]
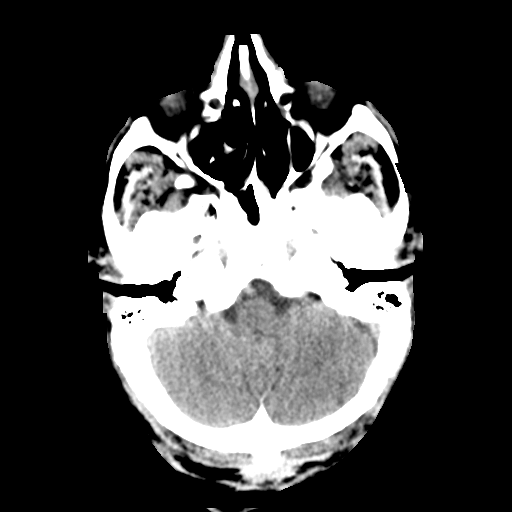
[im 6/32  bone]
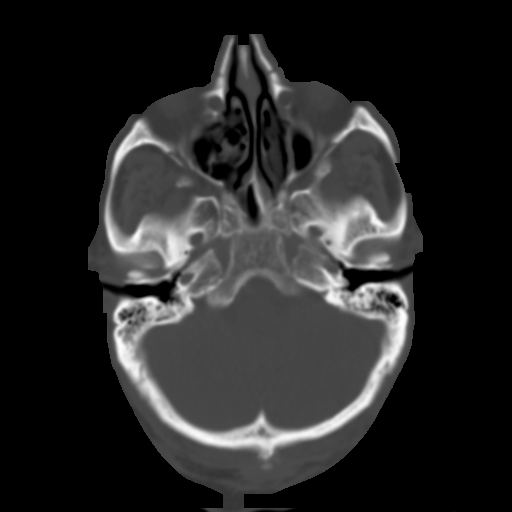
[im 11/32  brain]
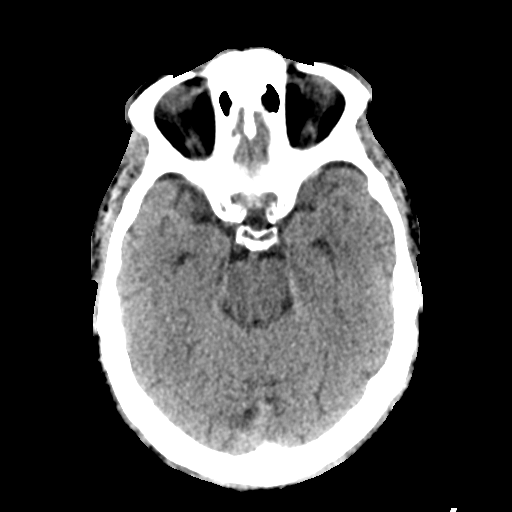
[im 16/32  brain]
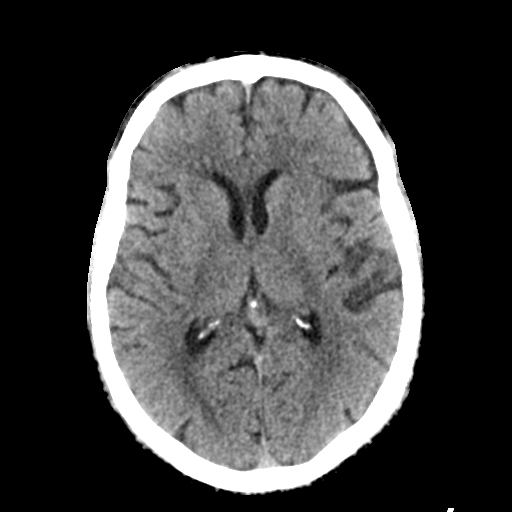
[im 21/32  brain]
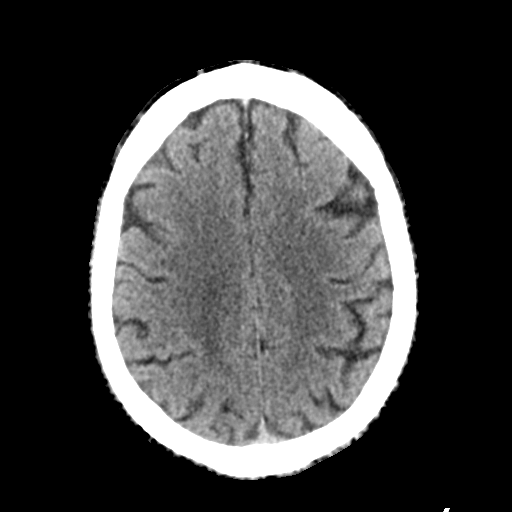
[im 26/32  brain]
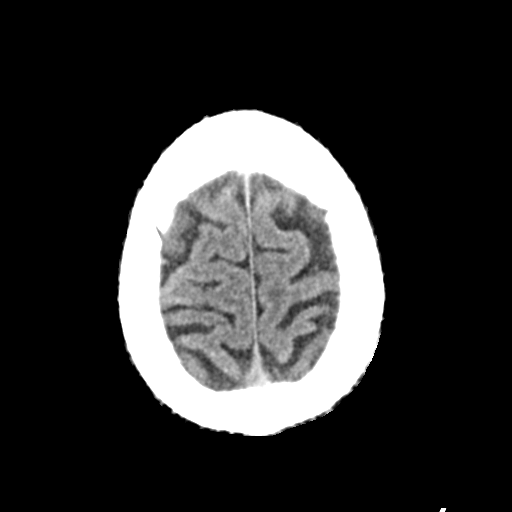
[im 26/32  bone]
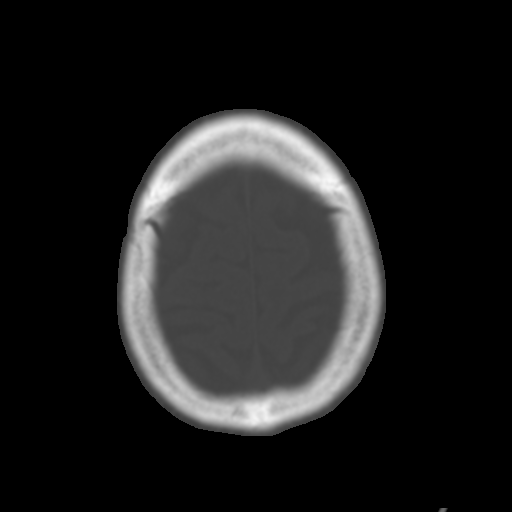

[15 of 47 positions shown; findings below may reference images not displayed]

FINDINGS: Brain: No significant parenchymal volume loss given patient's age.
Mild burden of chronic ischemic small vessel white matter disease.
No evidence of acute large vascular territory infarction,
hemorrhage, hydrocephalus, extra-axial collection or mass
lesion/mass effect.

Vascular: No hyperdense vessel. Atherosclerotic calcifications of
the internal carotid and vertebral arteries at the skull base.

Skull: Normal. Negative for fracture or focal lesion.

Sinuses/Orbits: Mild mucosal thickening of the mastoid sinuses and
ethmoid air cells. Orbits are unremarkable.

Other: Trace bilateral mastoid effusions.
IMPRESSION: 1. No acute intracranial abnormality.
2. Mild burden of chronic ischemic small vessel white matter
disease.
3. Trace bilateral mastoid effusions with mild mucosal thickening of
the mastoid air cells and ethmoid sinuses may reflect sinus
disease.

## 2023-09-12 IMAGING — MR MR HEAD W/O CM
7 series · 48 of 48 positions shown · non-contrast
Comparison: CT head 01/01/2022

CLINICAL DATA: Acute neuro deficit.  Right calf numbness.

EXAM:
MRI HEAD WITHOUT CONTRAST
TECHNIQUE: Multiplanar, multiecho pulse sequences of the brain and surrounding
structures were obtained without intravenous contrast.

[Series 5: DWI · axial · 3.0mm · 0.88mm/px · z∈[-76,+71]mm · 15 of 100 slices shown (1 of 4)]
[im 1/100]
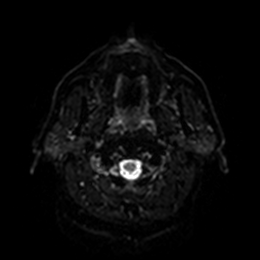
[im 8/100]
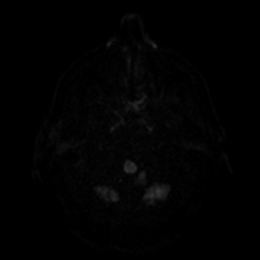
[im 15/100]
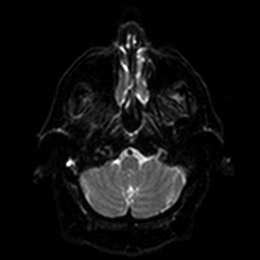
[im 22/100]
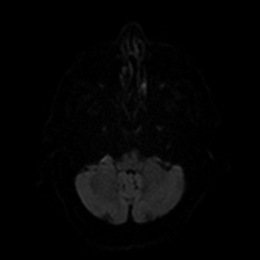
[im 29/100]
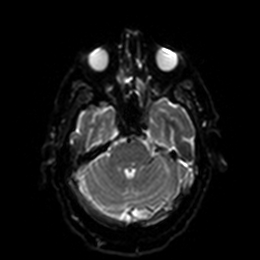
[im 36/100]
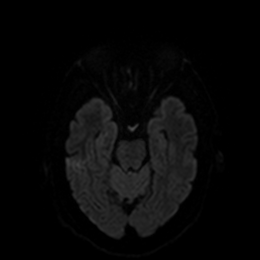
[im 43/100]
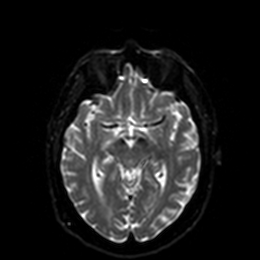
[im 50/100]
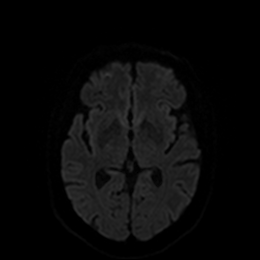
[im 57/100]
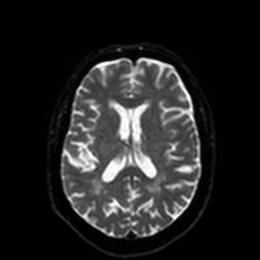
[im 64/100]
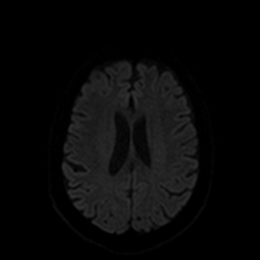
[im 71/100]
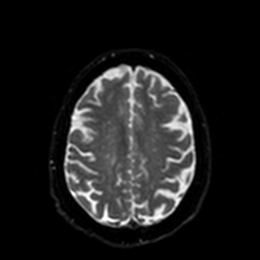
[im 78/100]
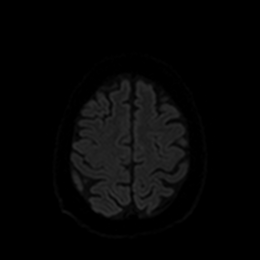
[im 85/100]
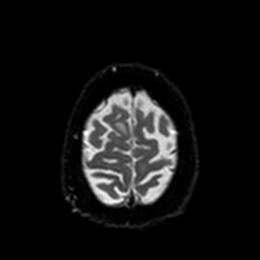
[im 92/100]
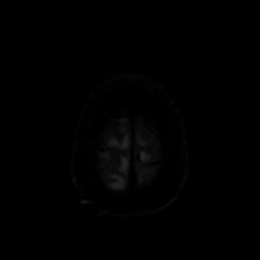
[im 100/100]
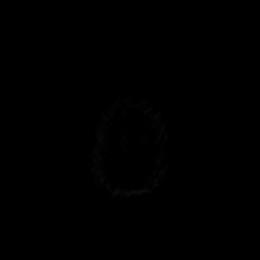

[Series 6: DWI · axial · 3.0mm · 0.88mm/px · z∈[-76,+71]mm · 7 of 50 slices shown (2 of 4)]
[im 1/50]
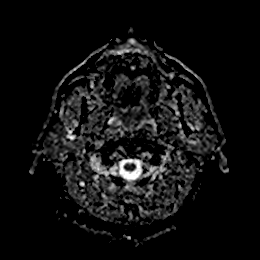
[im 9/50]
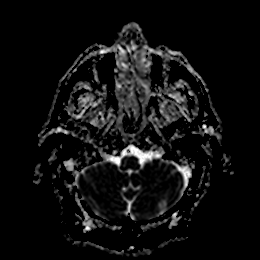
[im 17/50]
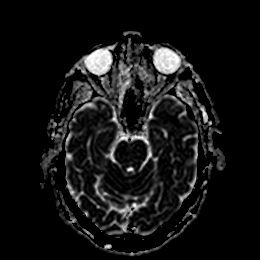
[im 25/50]
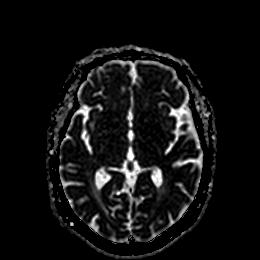
[im 33/50]
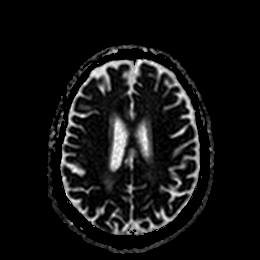
[im 41/50]
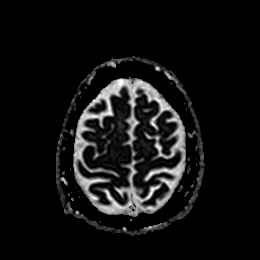
[im 50/50]
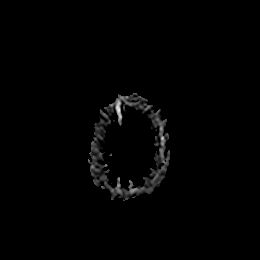

[Series 7: DWI · coronal · 4.0mm · 0.88mm/px · 10 of 70 slices shown (3 of 4)]
[im 1/70]
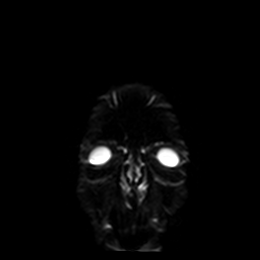
[im 8/70]
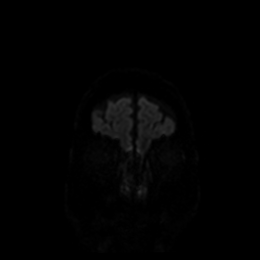
[im 16/70]
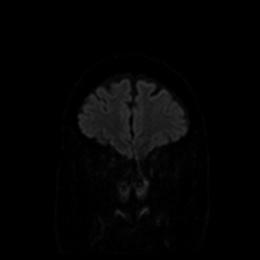
[im 24/70]
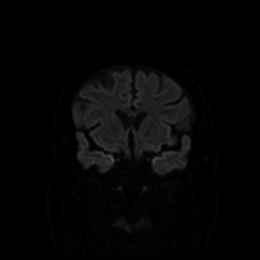
[im 31/70]
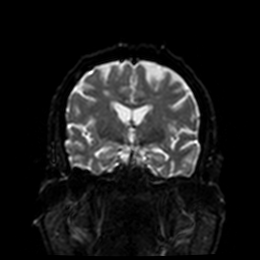
[im 39/70]
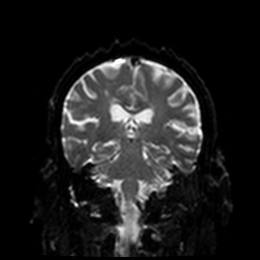
[im 47/70]
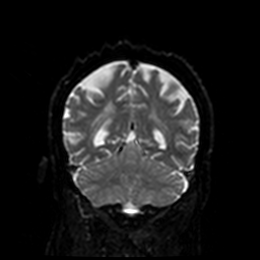
[im 54/70]
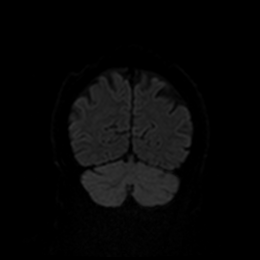
[im 62/70]
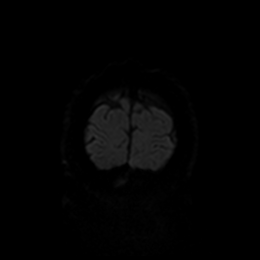
[im 70/70]
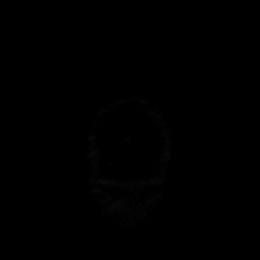

[Series 8: DWI · coronal · 4.0mm · 0.88mm/px · 5 of 35 slices shown (4 of 4)]
[im 1/35]
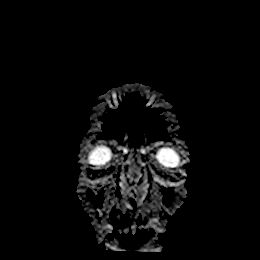
[im 9/35]
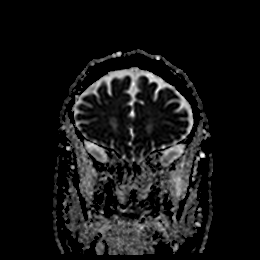
[im 18/35]
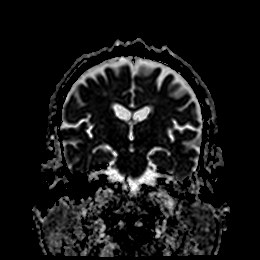
[im 26/35]
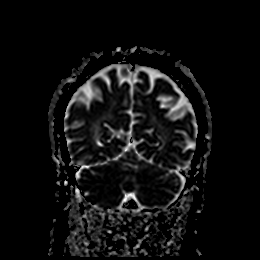
[im 35/35]
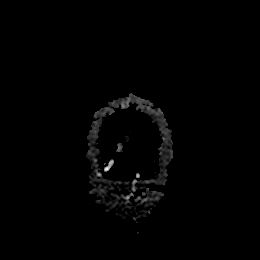

[Series 9: T1 · sagittal · 5.0mm · 0.75mm/px · 3 of 23 slices shown]
[im 1/23]
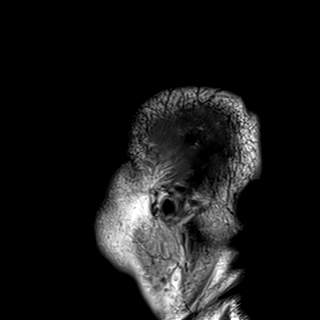
[im 12/23]
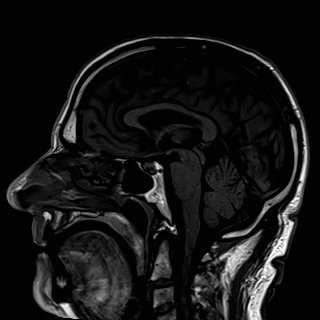
[im 23/23]
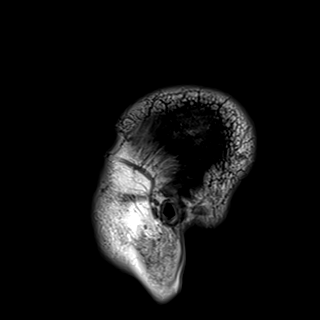

[Series 10: T2 · axial · 5.0mm · 0.72mm/px · z∈[-74,+70]mm · 4 of 25 slices shown]
[im 1/25]
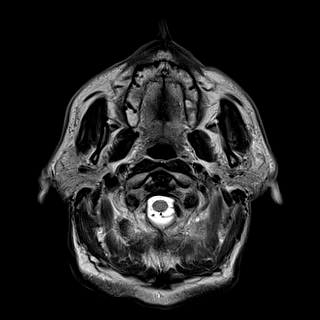
[im 9/25]
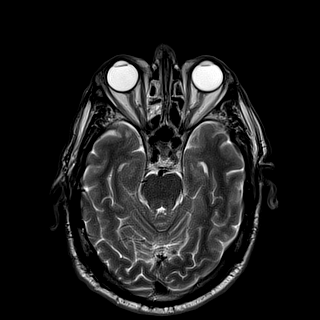
[im 17/25]
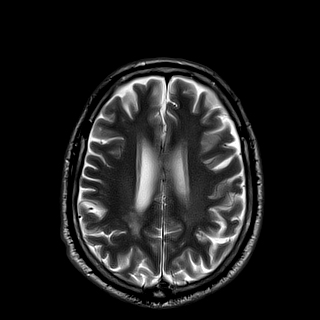
[im 25/25]
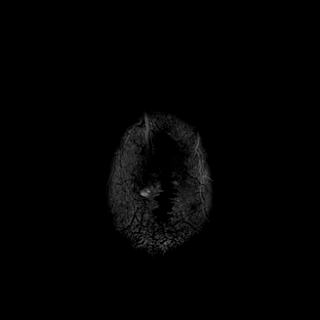

[Series 11: FLAIR · axial · 5.0mm · 0.45mm/px · z∈[-74,+70]mm · 4 of 25 slices shown]
[im 1/25]
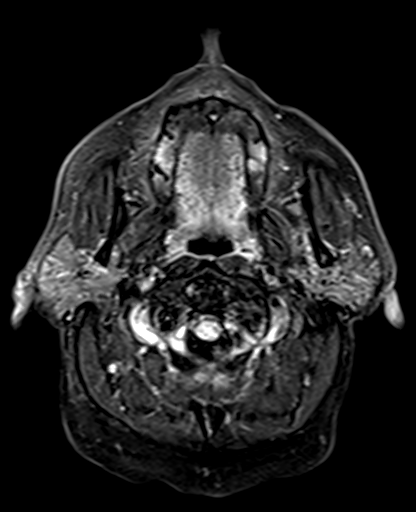
[im 9/25]
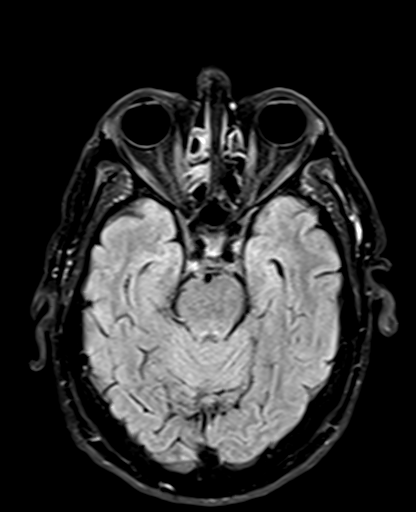
[im 17/25]
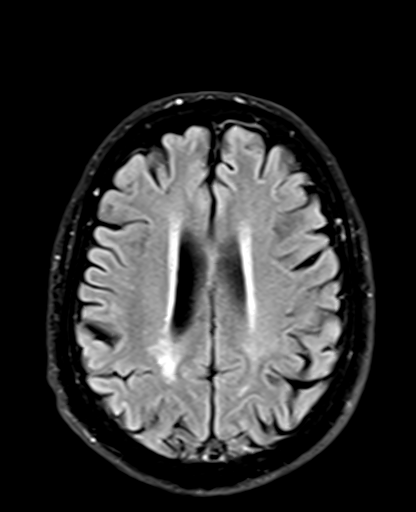
[im 25/25]
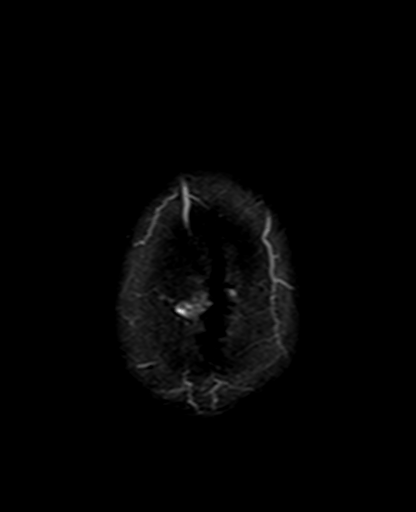

[48 of 48 positions shown; findings below may reference images not displayed]

FINDINGS: Brain: Limited study. Patient not able to complete the study and
terminated the examination early.

Negative for acute infarct. Mild periventricular deep white matter
hyperintensity bilaterally. Ventricle size normal. No mass or edema.

Vascular: Normal arterial flow voids at the skull base.

Skull and upper cervical spine: No focal skeletal lesion.

Sinuses/Orbits: Mild mucosal edema paranasal sinuses. Bilateral
cataract extraction

Other: None
IMPRESSION: Limited study.  Patient not able to complete the examination

Negative for acute infarct. Mild chronic microvascular ischemic
change in the white matter.

## 2024-05-04 DIAGNOSIS — E669 Obesity, unspecified: Secondary | ICD-10-CM | POA: Diagnosis not present

## 2024-05-04 DIAGNOSIS — R600 Localized edema: Secondary | ICD-10-CM | POA: Diagnosis not present

## 2024-05-04 DIAGNOSIS — F319 Bipolar disorder, unspecified: Secondary | ICD-10-CM | POA: Diagnosis not present

## 2024-05-04 DIAGNOSIS — N401 Enlarged prostate with lower urinary tract symptoms: Secondary | ICD-10-CM | POA: Diagnosis not present

## 2024-05-04 DIAGNOSIS — D126 Benign neoplasm of colon, unspecified: Secondary | ICD-10-CM | POA: Diagnosis not present

## 2024-05-04 DIAGNOSIS — I2510 Atherosclerotic heart disease of native coronary artery without angina pectoris: Secondary | ICD-10-CM | POA: Diagnosis not present

## 2024-05-04 DIAGNOSIS — E785 Hyperlipidemia, unspecified: Secondary | ICD-10-CM | POA: Diagnosis not present

## 2024-05-04 DIAGNOSIS — E1129 Type 2 diabetes mellitus with other diabetic kidney complication: Secondary | ICD-10-CM | POA: Diagnosis not present

## 2024-05-04 DIAGNOSIS — N1831 Chronic kidney disease, stage 3a: Secondary | ICD-10-CM | POA: Diagnosis not present

## 2024-05-04 DIAGNOSIS — R946 Abnormal results of thyroid function studies: Secondary | ICD-10-CM | POA: Diagnosis not present

## 2024-05-04 DIAGNOSIS — I129 Hypertensive chronic kidney disease with stage 1 through stage 4 chronic kidney disease, or unspecified chronic kidney disease: Secondary | ICD-10-CM | POA: Diagnosis not present

## 2024-05-04 DIAGNOSIS — Z0000 Encounter for general adult medical examination without abnormal findings: Secondary | ICD-10-CM | POA: Diagnosis not present

## 2024-06-16 DIAGNOSIS — R600 Localized edema: Secondary | ICD-10-CM | POA: Diagnosis not present

## 2024-06-16 DIAGNOSIS — E1129 Type 2 diabetes mellitus with other diabetic kidney complication: Secondary | ICD-10-CM | POA: Diagnosis not present

## 2024-06-16 DIAGNOSIS — E785 Hyperlipidemia, unspecified: Secondary | ICD-10-CM | POA: Diagnosis not present

## 2024-06-16 DIAGNOSIS — E039 Hypothyroidism, unspecified: Secondary | ICD-10-CM | POA: Diagnosis not present

## 2024-06-16 DIAGNOSIS — I10 Essential (primary) hypertension: Secondary | ICD-10-CM | POA: Diagnosis not present

## 2024-06-16 DIAGNOSIS — F319 Bipolar disorder, unspecified: Secondary | ICD-10-CM | POA: Diagnosis not present

## 2024-06-16 DIAGNOSIS — I5021 Acute systolic (congestive) heart failure: Secondary | ICD-10-CM | POA: Diagnosis not present

## 2024-06-16 DIAGNOSIS — N1831 Chronic kidney disease, stage 3a: Secondary | ICD-10-CM | POA: Diagnosis not present

## 2024-06-20 ENCOUNTER — Other Ambulatory Visit (HOSPITAL_COMMUNITY): Payer: Self-pay | Admitting: Endocrinology

## 2024-06-20 DIAGNOSIS — R002 Palpitations: Secondary | ICD-10-CM

## 2024-06-20 DIAGNOSIS — I10 Essential (primary) hypertension: Secondary | ICD-10-CM

## 2024-06-20 DIAGNOSIS — I2510 Atherosclerotic heart disease of native coronary artery without angina pectoris: Secondary | ICD-10-CM

## 2024-06-20 DIAGNOSIS — R600 Localized edema: Secondary | ICD-10-CM

## 2024-07-12 ENCOUNTER — Other Ambulatory Visit: Payer: Self-pay

## 2024-07-12 ENCOUNTER — Inpatient Hospital Stay (HOSPITAL_COMMUNITY)
Admission: EM | Admit: 2024-07-12 | Discharge: 2024-07-21 | DRG: 286 | Disposition: A | Discharge status: Home Health | Source: Ambulatory Visit | Attending: Internal Medicine | Admitting: Internal Medicine

## 2024-07-12 ENCOUNTER — Emergency Department (HOSPITAL_COMMUNITY)

## 2024-07-12 ENCOUNTER — Encounter (HOSPITAL_COMMUNITY): Payer: Self-pay | Admitting: *Deleted

## 2024-07-12 DIAGNOSIS — I2609 Other pulmonary embolism with acute cor pulmonale: Secondary | ICD-10-CM | POA: Diagnosis present

## 2024-07-12 DIAGNOSIS — Z87891 Personal history of nicotine dependence: Secondary | ICD-10-CM

## 2024-07-12 DIAGNOSIS — Z833 Family history of diabetes mellitus: Secondary | ICD-10-CM

## 2024-07-12 DIAGNOSIS — I440 Atrioventricular block, first degree: Secondary | ICD-10-CM | POA: Diagnosis not present

## 2024-07-12 DIAGNOSIS — G4730 Sleep apnea, unspecified: Secondary | ICD-10-CM | POA: Diagnosis not present

## 2024-07-12 DIAGNOSIS — I5023 Acute on chronic systolic (congestive) heart failure: Secondary | ICD-10-CM | POA: Insufficient documentation

## 2024-07-12 DIAGNOSIS — I071 Rheumatic tricuspid insufficiency: Secondary | ICD-10-CM | POA: Diagnosis present

## 2024-07-12 DIAGNOSIS — I350 Nonrheumatic aortic (valve) stenosis: Principal | ICD-10-CM

## 2024-07-12 DIAGNOSIS — E1169 Type 2 diabetes mellitus with other specified complication: Secondary | ICD-10-CM | POA: Diagnosis present

## 2024-07-12 DIAGNOSIS — I5021 Acute systolic (congestive) heart failure: Secondary | ICD-10-CM | POA: Diagnosis not present

## 2024-07-12 DIAGNOSIS — R918 Other nonspecific abnormal finding of lung field: Secondary | ICD-10-CM | POA: Diagnosis not present

## 2024-07-12 DIAGNOSIS — N401 Enlarged prostate with lower urinary tract symptoms: Secondary | ICD-10-CM | POA: Diagnosis present

## 2024-07-12 DIAGNOSIS — E785 Hyperlipidemia, unspecified: Secondary | ICD-10-CM | POA: Diagnosis present

## 2024-07-12 DIAGNOSIS — N400 Enlarged prostate without lower urinary tract symptoms: Secondary | ICD-10-CM | POA: Diagnosis present

## 2024-07-12 DIAGNOSIS — I5043 Acute on chronic combined systolic (congestive) and diastolic (congestive) heart failure: Secondary | ICD-10-CM | POA: Diagnosis present

## 2024-07-12 DIAGNOSIS — Z1152 Encounter for screening for COVID-19: Secondary | ICD-10-CM

## 2024-07-12 DIAGNOSIS — N179 Acute kidney failure, unspecified: Secondary | ICD-10-CM | POA: Diagnosis present

## 2024-07-12 DIAGNOSIS — Z79899 Other long term (current) drug therapy: Secondary | ICD-10-CM

## 2024-07-12 DIAGNOSIS — N1831 Chronic kidney disease, stage 3a: Secondary | ICD-10-CM | POA: Diagnosis not present

## 2024-07-12 DIAGNOSIS — E1129 Type 2 diabetes mellitus with other diabetic kidney complication: Secondary | ICD-10-CM | POA: Diagnosis present

## 2024-07-12 DIAGNOSIS — J811 Chronic pulmonary edema: Secondary | ICD-10-CM | POA: Diagnosis not present

## 2024-07-12 DIAGNOSIS — E7849 Other hyperlipidemia: Secondary | ICD-10-CM | POA: Diagnosis present

## 2024-07-12 DIAGNOSIS — E039 Hypothyroidism, unspecified: Secondary | ICD-10-CM | POA: Diagnosis present

## 2024-07-12 DIAGNOSIS — K029 Dental caries, unspecified: Secondary | ICD-10-CM | POA: Diagnosis not present

## 2024-07-12 DIAGNOSIS — R0989 Other specified symptoms and signs involving the circulatory and respiratory systems: Secondary | ICD-10-CM | POA: Diagnosis not present

## 2024-07-12 DIAGNOSIS — I509 Heart failure, unspecified: Principal | ICD-10-CM

## 2024-07-12 DIAGNOSIS — N1832 Chronic kidney disease, stage 3b: Secondary | ICD-10-CM | POA: Diagnosis present

## 2024-07-12 DIAGNOSIS — I4510 Unspecified right bundle-branch block: Secondary | ICD-10-CM | POA: Diagnosis present

## 2024-07-12 DIAGNOSIS — K219 Gastro-esophageal reflux disease without esophagitis: Secondary | ICD-10-CM | POA: Diagnosis present

## 2024-07-12 DIAGNOSIS — I272 Pulmonary hypertension, unspecified: Secondary | ICD-10-CM | POA: Diagnosis present

## 2024-07-12 DIAGNOSIS — F319 Bipolar disorder, unspecified: Secondary | ICD-10-CM | POA: Diagnosis present

## 2024-07-12 DIAGNOSIS — I130 Hypertensive heart and chronic kidney disease with heart failure and stage 1 through stage 4 chronic kidney disease, or unspecified chronic kidney disease: Secondary | ICD-10-CM | POA: Diagnosis not present

## 2024-07-12 DIAGNOSIS — I2510 Atherosclerotic heart disease of native coronary artery without angina pectoris: Secondary | ICD-10-CM

## 2024-07-12 DIAGNOSIS — R600 Localized edema: Secondary | ICD-10-CM | POA: Diagnosis not present

## 2024-07-12 DIAGNOSIS — I5082 Biventricular heart failure: Secondary | ICD-10-CM | POA: Diagnosis present

## 2024-07-12 DIAGNOSIS — I700 Atherosclerosis of aorta: Secondary | ICD-10-CM | POA: Diagnosis not present

## 2024-07-12 DIAGNOSIS — E669 Obesity, unspecified: Secondary | ICD-10-CM | POA: Diagnosis present

## 2024-07-12 DIAGNOSIS — R0602 Shortness of breath: Secondary | ICD-10-CM | POA: Diagnosis not present

## 2024-07-12 DIAGNOSIS — Z8249 Family history of ischemic heart disease and other diseases of the circulatory system: Secondary | ICD-10-CM

## 2024-07-12 DIAGNOSIS — J9601 Acute respiratory failure with hypoxia: Secondary | ICD-10-CM | POA: Diagnosis present

## 2024-07-12 DIAGNOSIS — Z79890 Hormone replacement therapy (postmenopausal): Secondary | ICD-10-CM | POA: Diagnosis not present

## 2024-07-12 DIAGNOSIS — N5089 Other specified disorders of the male genital organs: Secondary | ICD-10-CM | POA: Diagnosis present

## 2024-07-12 DIAGNOSIS — I10 Essential (primary) hypertension: Secondary | ICD-10-CM | POA: Diagnosis not present

## 2024-07-12 DIAGNOSIS — I3481 Nonrheumatic mitral (valve) annulus calcification: Secondary | ICD-10-CM | POA: Diagnosis present

## 2024-07-12 DIAGNOSIS — I517 Cardiomegaly: Secondary | ICD-10-CM | POA: Diagnosis not present

## 2024-07-12 DIAGNOSIS — R1909 Other intra-abdominal and pelvic swelling, mass and lump: Secondary | ICD-10-CM | POA: Diagnosis not present

## 2024-07-12 DIAGNOSIS — I255 Ischemic cardiomyopathy: Secondary | ICD-10-CM | POA: Diagnosis present

## 2024-07-12 DIAGNOSIS — E871 Hypo-osmolality and hyponatremia: Secondary | ICD-10-CM | POA: Diagnosis present

## 2024-07-12 DIAGNOSIS — Z952 Presence of prosthetic heart valve: Secondary | ICD-10-CM

## 2024-07-12 DIAGNOSIS — Z452 Encounter for adjustment and management of vascular access device: Secondary | ICD-10-CM | POA: Diagnosis not present

## 2024-07-12 DIAGNOSIS — I441 Atrioventricular block, second degree: Secondary | ICD-10-CM | POA: Diagnosis present

## 2024-07-12 DIAGNOSIS — Z7982 Long term (current) use of aspirin: Secondary | ICD-10-CM

## 2024-07-12 DIAGNOSIS — G4733 Obstructive sleep apnea (adult) (pediatric): Secondary | ICD-10-CM | POA: Diagnosis present

## 2024-07-12 DIAGNOSIS — I50810 Right heart failure, unspecified: Secondary | ICD-10-CM | POA: Diagnosis not present

## 2024-07-12 DIAGNOSIS — I351 Nonrheumatic aortic (valve) insufficiency: Secondary | ICD-10-CM | POA: Diagnosis present

## 2024-07-12 DIAGNOSIS — I4439 Other atrioventricular block: Secondary | ICD-10-CM | POA: Diagnosis not present

## 2024-07-12 DIAGNOSIS — I5041 Acute combined systolic (congestive) and diastolic (congestive) heart failure: Secondary | ICD-10-CM | POA: Diagnosis not present

## 2024-07-12 DIAGNOSIS — E1122 Type 2 diabetes mellitus with diabetic chronic kidney disease: Secondary | ICD-10-CM | POA: Diagnosis present

## 2024-07-12 DIAGNOSIS — J9811 Atelectasis: Secondary | ICD-10-CM | POA: Diagnosis not present

## 2024-07-12 DIAGNOSIS — I2582 Chronic total occlusion of coronary artery: Secondary | ICD-10-CM | POA: Diagnosis present

## 2024-07-12 DIAGNOSIS — J90 Pleural effusion, not elsewhere classified: Secondary | ICD-10-CM | POA: Diagnosis not present

## 2024-07-12 DIAGNOSIS — K2100 Gastro-esophageal reflux disease with esophagitis, without bleeding: Secondary | ICD-10-CM | POA: Diagnosis not present

## 2024-07-12 DIAGNOSIS — M7989 Other specified soft tissue disorders: Secondary | ICD-10-CM | POA: Diagnosis present

## 2024-07-12 DIAGNOSIS — E1159 Type 2 diabetes mellitus with other circulatory complications: Secondary | ICD-10-CM | POA: Diagnosis not present

## 2024-07-12 DIAGNOSIS — I110 Hypertensive heart disease with heart failure: Secondary | ICD-10-CM | POA: Diagnosis not present

## 2024-07-12 DIAGNOSIS — N170 Acute kidney failure with tubular necrosis: Secondary | ICD-10-CM | POA: Diagnosis not present

## 2024-07-12 LAB — CBC WITH DIFFERENTIAL/PLATELET
Abs Immature Granulocytes: 0.02 K/uL (ref 0.00–0.07)
Basophils Absolute: 0 K/uL (ref 0.0–0.1)
Basophils Relative: 1 %
Eosinophils Absolute: 0.1 K/uL (ref 0.0–0.5)
Eosinophils Relative: 1 %
HCT: 34 % — ABNORMAL LOW (ref 39.0–52.0)
Hemoglobin: 10.6 g/dL — ABNORMAL LOW (ref 13.0–17.0)
Immature Granulocytes: 0 %
Lymphocytes Relative: 11 %
Lymphs Abs: 0.8 K/uL (ref 0.7–4.0)
MCH: 28.9 pg (ref 26.0–34.0)
MCHC: 31.2 g/dL (ref 30.0–36.0)
MCV: 92.6 fL (ref 80.0–100.0)
Monocytes Absolute: 0.5 K/uL (ref 0.1–1.0)
Monocytes Relative: 6 %
Neutro Abs: 6 K/uL (ref 1.7–7.7)
Neutrophils Relative %: 81 %
Platelets: 224 K/uL (ref 150–400)
RBC: 3.67 MIL/uL — ABNORMAL LOW (ref 4.22–5.81)
RDW: 15.6 % — ABNORMAL HIGH (ref 11.5–15.5)
WBC: 7.4 K/uL (ref 4.0–10.5)
nRBC: 0 % (ref 0.0–0.2)

## 2024-07-12 LAB — RESP PANEL BY RT-PCR (RSV, FLU A&B, COVID)  RVPGX2
Influenza A by PCR: NEGATIVE
Influenza B by PCR: NEGATIVE
Resp Syncytial Virus by PCR: NEGATIVE
SARS Coronavirus 2 by RT PCR: NEGATIVE

## 2024-07-12 LAB — BASIC METABOLIC PANEL WITH GFR
Anion gap: 9 (ref 5–15)
BUN: 35 mg/dL — ABNORMAL HIGH (ref 8–23)
CO2: 24 mmol/L (ref 22–32)
Calcium: 8.8 mg/dL — ABNORMAL LOW (ref 8.9–10.3)
Chloride: 104 mmol/L (ref 98–111)
Creatinine, Ser: 1.42 mg/dL — ABNORMAL HIGH (ref 0.61–1.24)
GFR, Estimated: 49 mL/min — ABNORMAL LOW (ref >60)
Glucose, Bld: 98 mg/dL (ref 70–99)
Potassium: 4.4 mmol/L (ref 3.5–5.1)
Sodium: 137 mmol/L (ref 135–145)

## 2024-07-12 LAB — BRAIN NATRIURETIC PEPTIDE: B Natriuretic Peptide: 4159.6 pg/mL — ABNORMAL HIGH (ref 0.0–100.0)

## 2024-07-12 LAB — MAGNESIUM: Magnesium: 1.8 mg/dL (ref 1.7–2.4)

## 2024-07-12 MED ORDER — FUROSEMIDE 10 MG/ML IJ SOLN
60.0000 mg | Freq: Two times a day (BID) | INTRAMUSCULAR | Status: DC
  Administered 2024-07-13 – 2024-07-14 (×4): 60 mg via INTRAVENOUS
  Filled 2024-07-12 (×4): qty 6

## 2024-07-12 MED ORDER — POLYETHYLENE GLYCOL 3350 17 G PO PACK
17.0000 g | PACK | Freq: Every day | ORAL | Status: DC | PRN
  Administered 2024-07-20: 17 g via ORAL
  Filled 2024-07-12: qty 1

## 2024-07-12 MED ORDER — FUROSEMIDE 10 MG/ML IJ SOLN
40.0000 mg | Freq: Once | INTRAMUSCULAR | Status: AC
  Administered 2024-07-12: 40 mg via INTRAVENOUS
  Filled 2024-07-12: qty 4

## 2024-07-12 MED ORDER — ACETAMINOPHEN 325 MG PO TABS: 650.0000 mg | ORAL_TABLET | Freq: Four times a day (QID) | ORAL | Status: DC | PRN

## 2024-07-12 MED ORDER — ENOXAPARIN SODIUM 40 MG/0.4ML IJ SOSY
40.0000 mg | PREFILLED_SYRINGE | INTRAMUSCULAR | Status: DC
  Administered 2024-07-12 – 2024-07-14 (×3): 40 mg via SUBCUTANEOUS
  Filled 2024-07-12 (×3): qty 0.4

## 2024-07-12 MED ORDER — ACETAMINOPHEN 650 MG RE SUPP: 650.0000 mg | Freq: Four times a day (QID) | RECTAL | Status: DC | PRN

## 2024-07-12 MED ORDER — SODIUM CHLORIDE 0.9% FLUSH
3.0000 mL | Freq: Two times a day (BID) | INTRAVENOUS | Status: DC
  Administered 2024-07-12 – 2024-07-21 (×17): 3 mL via INTRAVENOUS

## 2024-07-12 NOTE — ED Provider Triage Note (Signed)
 Emergency Medicine Provider Triage Evaluation Note  [name] , a 83 y.o. male  was evaluated in triage.  Pt complains of sob. Progressive worsening sob and fluid retention x 1 month.  Report 9 lbs weight gain. Also notice swelling to his scrotum, hurts while walking.  Seen by PCP and sent here.  Sts he's complaint with his diuretic.  Denies dietary changes  Review of Systems  Positive: As above Negative: As above  Physical Exam  BP 127/76   Pulse 60   Temp 97.6 F (36.4 C)   Resp 18   SpO2 95%  Gen:   Awake, no distress   Resp:  Normal effort  MSK:   Moves extremities without difficulty  Other:    Medical Decision Making  Medically screening exam initiated at 3:13 PM.  Appropriate orders placed.  [name] was informed that the remainder of the evaluation will be completed by another provider, this initial triage assessment does not replace that evaluation, and the importance of remaining in the ED until their evaluation is complete.     Nivia Colon, PA-C 07/12/24 [phone]

## 2024-07-12 NOTE — ED Triage Notes (Addendum)
 Pt was seen by PCP due to edema, he states that he had had about 15lbs weight gain (was 180lbs today in MD office-usually 150lbs).  Pt was found to have significant BLE edema as well as swelling in hip area and swelling of testicle and penis.  Pt has some sob.  No CP. Pt is on 40mg  Lasix which he has been taking as prescribed.

## 2024-07-12 NOTE — H&P (Addendum)
 History and Physical   [name] FMW:[id number] DOB: 15-Jul-1941 DOA: 07/12/2024  PCP: Nichole Senior, MD   Patient coming from: Home  Chief Complaint: Edema, shortness of breath  HPI: [name] is a 83 y.o. male with medical history significant of hypertension, hyperlipidemia, diabetes, GERD, hypothyroidism, CAD, CKD 3A, bipolar disorder, BPH, OSA, obesity presenting with worsening shortness of breath and edema.  Patient reports about 1 month of worsening shortness of breath and edema that has significantly worsened in the past week or 2.  Reports dyspnea on exertion and orthopnea.  Shortness of breath.  Edema is now all the way up his leg and into his scrotum.  Reports about 15 pound weight gain in the past month.  Has been evaluated by PCP and started on Lasix starting a month ago without significant response. Followed up today and was sent to the ED due to worsening symptoms.  Also reports several months of increased mucus worse at night and interrupting his sleep.  Denies fevers, chills, chest pain, abdominal pain, constipation, diarrhea, nausea, vomiting.  ED Course: Vital signs in ED stable.  Lab workup included BMP with BUN 35, creatinine 1.42, calcium 8.8.  CBC with hemoglobin stable at 10.6.  BNP elevated to 4159.  Respiratory panel for flu COVID RSV negative.  Chest x-ray showed cardiomegaly with mild vascular congestion and small bilateral effusions.  Patient received in 40 mg IV Lasix in the ED.  Review of Systems: As per HPI otherwise all other systems reviewed and are negative.  Past Medical History:  Diagnosis Date   Hypertension     Past Surgical History:  Procedure Laterality Date   APPENDECTOMY      Social History  reports that he has quit smoking. His smoking use included cigarettes. He has never used smokeless tobacco. He reports current alcohol use. He reports that he does not use drugs.  No Known Allergies  Family History  Problem Relation Age of  Onset   CAD Father    Diabetes Mellitus II Maternal Grandmother   Reviewed on admission  Prior to Admission medications   Medication Sig Start Date End Date Taking? Authorizing Provider  amLODipine  (NORVASC ) 5 MG tablet Take 1 tablet (5 mg total) by mouth daily. 02/11/22 02/11/23  Singh, Prashant K, MD  carboxymethylcellulose (REFRESH PLUS) 0.5 % SOLN Place 1 drop into both eyes daily as needed (dry eyes).    [provider]  docusate sodium  (COLACE) 100 MG capsule Take 200 mg by mouth 2 (two) times daily as needed for mild constipation.    [provider]  fenofibrate  160 MG tablet Take 160 mg by mouth daily. 11/08/21   [provider]  fluticasone (FLONASE) 50 MCG/ACT nasal spray Place 2 sprays into both nostrils daily as needed for allergies. 06/24/18   [provider]  hydrocortisone (ANUSOL-HC) 2.5 % rectal cream Place 1 application. rectally 2 (two) times daily as needed for hemorrhoids. 01/31/22   [provider]  loratadine (CLARITIN) 10 MG tablet Take 10 mg by mouth daily as needed for allergies. 02/11/10   [provider]  neomycin-polymyxin b-dexamethasone (MAXITROL) 3.5-10000-0.1 SUSP 1 drop daily as needed (itchy eyes). 01/27/22   [provider]  polyethylene glycol (MIRALAX  / GLYCOLAX ) 17 g packet Take 17 g by mouth 2 (two) times daily as needed for mild constipation.    [provider]    Physical Exam: Vitals:   07/12/24 1442  BP: 127/76  Pulse: 60  Resp: 18  Temp: 97.6  F (36.4 C)  SpO2: 95%    Physical Exam Constitutional:      General: He is not in acute distress.    Appearance: Normal appearance.  HENT:     Head: Normocephalic and atraumatic.     Mouth/Throat:     Mouth: Mucous membranes are moist.     Pharynx: Oropharynx is clear.  Eyes:     Extraocular Movements: Extraocular movements intact.     Pupils: Pupils are equal, round, and reactive to light.  Cardiovascular:     Rate and Rhythm:  Normal rate and regular rhythm.     Pulses: Normal pulses.     Heart sounds: Normal heart sounds.  Pulmonary:     Effort: Pulmonary effort is normal. No respiratory distress.     Breath sounds: Rales present.  Abdominal:     General: Bowel sounds are normal. There is no distension.     Palpations: Abdomen is soft.     Tenderness: There is no abdominal tenderness.  Musculoskeletal:        General: No swelling or deformity.     Right lower leg: Edema present.     Left lower leg: Edema present.  Skin:    General: Skin is warm and dry.  Neurological:     General: No focal deficit present.     Mental Status: Mental status is at baseline.    Labs on Admission: I have personally reviewed following labs and imaging studies  CBC: Recent Labs  Lab 07/12/24 1514  WBC 7.4  NEUTROABS 6.0  HGB 10.6*  HCT 34.0*  MCV 92.6  PLT 224    Basic Metabolic Panel: Recent Labs  Lab 07/12/24 1514  NA 137  K 4.4  CL 104  CO2 24  GLUCOSE 98  BUN 35*  CREATININE 1.42*  CALCIUM 8.8*    GFR: CrCl cannot be calculated (Unknown ideal weight.).  Liver Function Tests: No results for input(s): AST, ALT, ALKPHOS, BILITOT, PROT, ALBUMIN in the last 168 hours.  Urine analysis:    Component Value Date/Time   COLORURINE YELLOW 02/08/2022 2352   APPEARANCEUR CLEAR 02/08/2022 2352   LABSPEC 1.008 02/08/2022 2352   PHURINE 5.0 02/08/2022 2352   GLUCOSEU NEGATIVE 02/08/2022 2352   HGBUR SMALL (A) 02/08/2022 2352   BILIRUBINUR NEGATIVE 02/08/2022 2352   KETONESUR NEGATIVE 02/08/2022 2352   PROTEINUR 30 (A) 02/08/2022 2352   NITRITE NEGATIVE 02/08/2022 2352   LEUKOCYTESUR NEGATIVE 02/08/2022 2352    Radiological Exams on Admission: DG Chest 2 View Result Date: 07/12/2024 CLINICAL DATA:  Shortness of breath and edema. EXAM: CHEST - 2 VIEW COMPARISON:  01/25/2022 FINDINGS: Lungs are somewhat hypoinflated demonstrate mild bibasilar opacification compatible with small bilateral  pleural effusions likely with associated basilar atelectasis. Mild hazy prominence of the central pulmonary vessels likely a degree of vascular congestion. Mild cardiomegaly. Remainder of the exam is unchanged. IMPRESSION: 1. Mild cardiomegaly with suggestion of mild vascular congestion. 2. Small bilateral pleural effusions with associated bibasilar atelectasis. Electronically Signed   By: Toribio Agreste M.D.   On: 07/12/2024 16:17    EKG: Independently reviewed.  Sinus rhythm at 60 beats minute with pressure AV block.  Metastatic T wave changes.  Right bundle branch block.  Similar to previous.  Assessment/Plan Principal Problem:   Acute CHF [facility]) Active Problems:   GERD   Essential hypertension   Acquired hypothyroidism   Bipolar disorder (HCC)   Hyperlipidemia   Lower urinary tract symptoms due to benign prostatic hyperplasia  Obesity   Stage 3a chronic kidney disease (HCC)   Sleep apnea   Type 2 diabetes mellitus with other diabetic kidney complication (HCC)   Acute CHF > No prior history of CHF. > 1 month of worsening shortness of breath and edema accelerated in the past 1 to 2 weeks. > Since ED with concern for progressive CHF.  Chest x-ray with cardiomegaly, vascular congestion, effusions.  BNP 4159.  Received 40 mg IV Lasix in the ED. > Reports about 15 pound weight gain at home over the past month - Monitor on telemetry - Lasix IV twice daily - Echocardiogram - Strict I's and O's, daily weights - Check magnesium - Trend renal function and electrolytes  Hypertension - Continue home amlodipine  - Lasix as above  Diabetes - No longer on medication for this. Controlled  Hypothyroidism - Continue home Synthroid  CAD - Losartan  CKD 3a > Creatinine appears stable at 1.42, but no recent to compare. - Trend renal function and electrolytes  OSA - Not on CPAP  Hyperlipidemia GERD History of bipolar disorder BPH Obesity - Noted  DVT prophylaxis: Lovenox  Code  Status:   Full Family Communication:  None on admission, patient preferred to update his wife after I spoke with him,   Disposition Plan:   Patient is from:  Home  Anticipated DC to:  Home  Anticipated DC date:  1 to 3 days  Anticipated DC barriers: None  Consults called:  None Admission status:  Observation, Telemetry  Severity of Illness: The appropriate patient status for this patient is OBSERVATION. Observation status is judged to be reasonable and necessary in order to provide the required intensity of service to ensure the patient's safety. The patient's presenting symptoms, physical exam findings, and initial radiographic and laboratory data in the context of their medical condition is felt to place them at decreased risk for further clinical deterioration. Furthermore, it is anticipated that the patient will be medically stable for discharge from the hospital within 2 midnights of admission.    Marsa KATHEE Scurry MD [facility]  How to contact the [facility] Attending or Consulting provider 7A - 7P or covering provider during after hours 7P -7A, for this patient?   Check the care team in [facility] and look for a) attending/consulting [facility] provider listed and b) the [facility] team listed Log into [email or web address] and use [facility] universal password to access. If you do not have the password, please contact the hospital operator. Locate the [facility] provider you are looking for under [facility] and page to a number that you can be directly reached. If you still have difficulty reaching the provider, please page the [facility] (Director on Call) for the Hospitalists listed on amion for assistance.  07/12/2024, 6:01 PM

## 2024-07-12 NOTE — ED Provider Notes (Addendum)
  EMERGENCY DEPARTMENT AT [facility] Provider Note   CSN: [id number] Arrival date & time: 07/12/24  1435     Patient presents with: Shortness of Breath and Leg Swelling   [name] is a 83 y.o. male.   This is an 83 year old male presenting emergency department for progressive shortness of breath and lower extremity edema.  Sent by PCP for concern of CHF exacerbation.  He notes worsening symptoms over the past month but seemingly acutely worsened over the past week and a half or 2 weeks.  Notes dyspnea on exertion, paroxysmal nocturnal dyspnea, and has been sleeping with recliner.  He notes the swelling has progressed up to his testicles and is causing him discomfort.  He is not having chest pain, not particularly short of breath sitting in bed.  No fever or URI symptoms.  He does take a diuretic and states that he is compliant with it.  He notes that he has not seen a cardiologist in years.   Shortness of Breath      Prior to Admission medications   Medication Sig Start Date End Date Taking? Authorizing Provider  amLODipine  (NORVASC ) 5 MG tablet Take 1 tablet (5 mg total) by mouth daily. 02/11/22 02/11/23  Singh, Prashant K, MD  carboxymethylcellulose (REFRESH PLUS) 0.5 % SOLN Place 1 drop into both eyes daily as needed (dry eyes).    [provider]  docusate sodium  (COLACE) 100 MG capsule Take 200 mg by mouth 2 (two) times daily as needed for mild constipation.    [provider]  fenofibrate  160 MG tablet Take 160 mg by mouth daily. 11/08/21   [provider]  fluticasone (FLONASE) 50 MCG/ACT nasal spray Place 2 sprays into both nostrils daily as needed for allergies. 06/24/18   [provider]  hydrocortisone (ANUSOL-HC) 2.5 % rectal cream Place 1 application. rectally 2 (two) times daily as needed for hemorrhoids. 01/31/22   [provider]  loratadine (CLARITIN) 10 MG tablet Take 10 mg by mouth daily as needed for  allergies. 02/11/10   [provider]  neomycin-polymyxin b-dexamethasone (MAXITROL) 3.5-10000-0.1 SUSP 1 drop daily as needed (itchy eyes). 01/27/22   [provider]  polyethylene glycol (MIRALAX  / GLYCOLAX ) 17 g packet Take 17 g by mouth 2 (two) times daily as needed for mild constipation.    [provider]    Allergies: Patient has no known allergies.    Review of Systems  Respiratory:  Positive for shortness of breath.     Updated Vital Signs BP 127/76   Pulse 60   Temp 97.6 F (36.4 C)   Resp 18   SpO2 95%   Physical Exam Vitals and nursing note reviewed.  Constitutional:      General: He is not in acute distress.    Appearance: He is not toxic-appearing.  HENT:     Head: Normocephalic.  Cardiovascular:     Rate and Rhythm: Normal rate and regular rhythm.  Pulmonary:     Effort: No tachypnea or accessory muscle usage.     Breath sounds: Rales present. No decreased breath sounds.  Genitourinary:    Comments: Significant edema to the testicles and foreskin of his penis.  No erythema induration or fluctuance.  No crepitus. Musculoskeletal:     Right lower leg: Edema present.     Left lower leg: Edema present.  Skin:    Capillary Refill: Capillary refill takes less than 2 seconds.  Neurological:     Mental  Status: He is alert and oriented to person, place, and time.  Psychiatric:        Mood and Affect: Mood normal.        Behavior: Behavior normal.     (all labs ordered are listed, but only abnormal results are displayed) Labs Reviewed  BASIC METABOLIC PANEL WITH GFR - Abnormal; Notable for the following components:      Result Value   BUN 35 (*)    Creatinine, Ser 1.42 (*)    Calcium 8.8 (*)    GFR, Estimated 49 (*)    All other components within normal limits  CBC WITH DIFFERENTIAL/PLATELET - Abnormal; Notable for the following components:   RBC 3.67 (*)    Hemoglobin 10.6 (*)    HCT 34.0 (*)    RDW 15.6 (*)    All other  components within normal limits  BRAIN NATRIURETIC PEPTIDE - Abnormal; Notable for the following components:   B Natriuretic Peptide 4,159.6 (*)    All other components within normal limits  RESP PANEL BY RT-PCR (RSV, FLU A&B, COVID)  [id number]    EKG: EKG Interpretation Date/Time:  Tuesday July 12 2024 14:56:17 EDT Ventricular Rate:  60 PR Interval:  294 QRS Duration:  148 QT Interval:  476 QTC Calculation: 476 R Axis:   268  Text Interpretation: Sinus rhythm with 1st degree A-V block Right bundle branch block Confirmed by Neysa Clap [phone]) on 07/12/2024 4:25:42 PM  Radiology: DG Chest 2 View Result Date: 07/12/2024 CLINICAL DATA:  Shortness of breath and edema. EXAM: CHEST - 2 VIEW COMPARISON:  01/25/2022 FINDINGS: Lungs are somewhat hypoinflated demonstrate mild bibasilar opacification compatible with small bilateral pleural effusions likely with associated basilar atelectasis. Mild hazy prominence of the central pulmonary vessels likely a degree of vascular congestion. Mild cardiomegaly. Remainder of the exam is unchanged. IMPRESSION: 1. Mild cardiomegaly with suggestion of mild vascular congestion. 2. Small bilateral pleural effusions with associated bibasilar atelectasis. Electronically Signed   By: Toribio Agreste M.D.   On: 07/12/2024 16:17     Procedures   Medications Ordered in the ED  furosemide (LASIX) injection 40 mg (has no administration in time range)    Clinical Course as of 07/12/24 1650  Tue Jul 12, 2024  1624 DG Chest 2 View IMPRESSION: 1. Mild cardiomegaly with suggestion of mild vascular congestion. 2. Small bilateral pleural effusions with associated bibasilar atelectasis.   Electronically Signed   By: Toribio Agreste M.D.   [TY]  1625 EKG appears to be normal sinus rhythm.  First-degree AV block noted.  No ischemic changes.  No QTc prolongation but does have similar to prior EKG [TY]  1626 CBC with Differential(!) Appears to have anemia.  On  review of old labs appears to be near baseline. [TY]  1626 Basic metabolic panel(!) Mild elevation in his BUN and creatinine.  No recent values appear to be 1.26 two years ago [TY]  1629 Complex past medical history per review of patients PCP note includes type 2 diabetes, CKD stage III AA CAD, obesity, hypertension, hyperlipidemia, bipolar CHF with reduced EF [TY]  1646 B Natriuretic Peptide(!): 4,159.6 [TY]    Clinical Course User Index [TY] Neysa Clap PARAS, DO                                 Medical Decision Making This is an 83 year old male presenting emergency department with signs and symptoms of acute  CHF exacerbation.  He has been compliant with his Lasix, but has had roughly 15 pound weight gain with edema extending up to his scrotum.  He is saturating well on room air at 95%, does not appear to be in distress.  Does have some crackles in his bases, but largely normal lung sounds.  Does have pitting edema as well as significant scrotal edema.  Does not appear to have evidence of infection/Fournier's gangrene to scrotum.  I do not see an actual echo per chart review, but he does have documented systolic heart failure on his problem list at his PCP office.  His BNP is significantly elevated here at 4159.  His chest x-ray on my independent review with no pneumothorax but does look like he has some vascular congestion.  Radiology reading agrees with vascular congestion and notes small bilateral pleural effusions.  Given his advanced age, numerous comorbidities listed in the ED course with progressive symptoms not improving with oral diuretic use will admit for further management of acute CHF exacerbation.  Will discussed with hospitalist.  Amount and/or Complexity of Data Reviewed External Data Reviewed:     Details: See ED course Labs:  Decision-making details documented in ED Course. Radiology:  Decision-making details documented in ED Course.  Risk Prescription drug  management. Decision regarding hospitalization. Diagnosis or treatment significantly limited by social determinants of health. Risk Details: Poor health literacy      Final diagnoses:  None    ED Discharge Orders     None          Neysa Caron PARAS, DO 07/12/24 1649    Neysa Caron PARAS, DO 07/12/24 1650

## 2024-07-12 NOTE — ED Notes (Signed)
Transport called to take pt upstairs 

## 2024-07-12 NOTE — Plan of Care (Signed)

## 2024-07-12 NOTE — ED Notes (Signed)
 Meds not verified at this time unable to give. Provided pt with 2 urinals and set up food tray

## 2024-07-12 NOTE — ED Triage Notes (Signed)
 Pt sent from PCP with concerns of CHF bc of swelling in groin and hips and 9lb weight gain. Pt also has dyspnea on exertion.

## 2024-07-13 ENCOUNTER — Observation Stay (HOSPITAL_COMMUNITY)

## 2024-07-13 DIAGNOSIS — Z79890 Hormone replacement therapy (postmenopausal): Secondary | ICD-10-CM | POA: Diagnosis not present

## 2024-07-13 DIAGNOSIS — I3481 Nonrheumatic mitral (valve) annulus calcification: Secondary | ICD-10-CM | POA: Diagnosis not present

## 2024-07-13 DIAGNOSIS — E871 Hypo-osmolality and hyponatremia: Secondary | ICD-10-CM | POA: Diagnosis present

## 2024-07-13 DIAGNOSIS — K219 Gastro-esophageal reflux disease without esophagitis: Secondary | ICD-10-CM | POA: Diagnosis present

## 2024-07-13 DIAGNOSIS — Z79899 Other long term (current) drug therapy: Secondary | ICD-10-CM | POA: Diagnosis not present

## 2024-07-13 DIAGNOSIS — I272 Pulmonary hypertension, unspecified: Secondary | ICD-10-CM | POA: Diagnosis present

## 2024-07-13 DIAGNOSIS — Z452 Encounter for adjustment and management of vascular access device: Secondary | ICD-10-CM | POA: Diagnosis not present

## 2024-07-13 DIAGNOSIS — I5021 Acute systolic (congestive) heart failure: Secondary | ICD-10-CM

## 2024-07-13 DIAGNOSIS — I5041 Acute combined systolic (congestive) and diastolic (congestive) heart failure: Secondary | ICD-10-CM

## 2024-07-13 DIAGNOSIS — J9811 Atelectasis: Secondary | ICD-10-CM | POA: Diagnosis not present

## 2024-07-13 DIAGNOSIS — R0989 Other specified symptoms and signs involving the circulatory and respiratory systems: Secondary | ICD-10-CM | POA: Diagnosis not present

## 2024-07-13 DIAGNOSIS — E785 Hyperlipidemia, unspecified: Secondary | ICD-10-CM

## 2024-07-13 DIAGNOSIS — I350 Nonrheumatic aortic (valve) stenosis: Secondary | ICD-10-CM

## 2024-07-13 DIAGNOSIS — E1169 Type 2 diabetes mellitus with other specified complication: Secondary | ICD-10-CM

## 2024-07-13 DIAGNOSIS — I509 Heart failure, unspecified: Secondary | ICD-10-CM | POA: Diagnosis not present

## 2024-07-13 DIAGNOSIS — I50810 Right heart failure, unspecified: Secondary | ICD-10-CM

## 2024-07-13 DIAGNOSIS — E1122 Type 2 diabetes mellitus with diabetic chronic kidney disease: Secondary | ICD-10-CM | POA: Diagnosis present

## 2024-07-13 DIAGNOSIS — I5023 Acute on chronic systolic (congestive) heart failure: Secondary | ICD-10-CM | POA: Diagnosis not present

## 2024-07-13 DIAGNOSIS — I255 Ischemic cardiomyopathy: Secondary | ICD-10-CM | POA: Diagnosis present

## 2024-07-13 DIAGNOSIS — I130 Hypertensive heart and chronic kidney disease with heart failure and stage 1 through stage 4 chronic kidney disease, or unspecified chronic kidney disease: Secondary | ICD-10-CM | POA: Diagnosis present

## 2024-07-13 DIAGNOSIS — I10 Essential (primary) hypertension: Secondary | ICD-10-CM | POA: Diagnosis not present

## 2024-07-13 DIAGNOSIS — K2100 Gastro-esophageal reflux disease with esophagitis, without bleeding: Secondary | ICD-10-CM

## 2024-07-13 DIAGNOSIS — N170 Acute kidney failure with tubular necrosis: Secondary | ICD-10-CM | POA: Diagnosis not present

## 2024-07-13 DIAGNOSIS — N401 Enlarged prostate with lower urinary tract symptoms: Secondary | ICD-10-CM

## 2024-07-13 DIAGNOSIS — I700 Atherosclerosis of aorta: Secondary | ICD-10-CM | POA: Diagnosis not present

## 2024-07-13 DIAGNOSIS — J9601 Acute respiratory failure with hypoxia: Secondary | ICD-10-CM | POA: Diagnosis present

## 2024-07-13 DIAGNOSIS — E1159 Type 2 diabetes mellitus with other circulatory complications: Secondary | ICD-10-CM | POA: Diagnosis not present

## 2024-07-13 DIAGNOSIS — N1832 Chronic kidney disease, stage 3b: Secondary | ICD-10-CM | POA: Diagnosis present

## 2024-07-13 DIAGNOSIS — I071 Rheumatic tricuspid insufficiency: Secondary | ICD-10-CM | POA: Diagnosis present

## 2024-07-13 DIAGNOSIS — F319 Bipolar disorder, unspecified: Secondary | ICD-10-CM

## 2024-07-13 DIAGNOSIS — I5082 Biventricular heart failure: Secondary | ICD-10-CM | POA: Diagnosis not present

## 2024-07-13 DIAGNOSIS — Z8249 Family history of ischemic heart disease and other diseases of the circulatory system: Secondary | ICD-10-CM | POA: Diagnosis not present

## 2024-07-13 DIAGNOSIS — E039 Hypothyroidism, unspecified: Secondary | ICD-10-CM | POA: Diagnosis not present

## 2024-07-13 DIAGNOSIS — R918 Other nonspecific abnormal finding of lung field: Secondary | ICD-10-CM | POA: Diagnosis not present

## 2024-07-13 DIAGNOSIS — I441 Atrioventricular block, second degree: Secondary | ICD-10-CM | POA: Diagnosis present

## 2024-07-13 DIAGNOSIS — N1831 Chronic kidney disease, stage 3a: Secondary | ICD-10-CM | POA: Diagnosis not present

## 2024-07-13 DIAGNOSIS — I2609 Other pulmonary embolism with acute cor pulmonale: Secondary | ICD-10-CM | POA: Diagnosis not present

## 2024-07-13 DIAGNOSIS — I5043 Acute on chronic combined systolic (congestive) and diastolic (congestive) heart failure: Secondary | ICD-10-CM | POA: Diagnosis present

## 2024-07-13 DIAGNOSIS — Z1152 Encounter for screening for COVID-19: Secondary | ICD-10-CM | POA: Diagnosis not present

## 2024-07-13 DIAGNOSIS — N179 Acute kidney failure, unspecified: Secondary | ICD-10-CM | POA: Diagnosis not present

## 2024-07-13 DIAGNOSIS — I2510 Atherosclerotic heart disease of native coronary artery without angina pectoris: Secondary | ICD-10-CM | POA: Diagnosis not present

## 2024-07-13 DIAGNOSIS — N400 Enlarged prostate without lower urinary tract symptoms: Secondary | ICD-10-CM | POA: Diagnosis present

## 2024-07-13 DIAGNOSIS — G4730 Sleep apnea, unspecified: Secondary | ICD-10-CM | POA: Diagnosis not present

## 2024-07-13 DIAGNOSIS — I517 Cardiomegaly: Secondary | ICD-10-CM | POA: Diagnosis not present

## 2024-07-13 LAB — COMPREHENSIVE METABOLIC PANEL WITH GFR
ALT: 29 U/L (ref 0–44)
AST: 24 U/L (ref 15–41)
Albumin: 2.9 g/dL — ABNORMAL LOW (ref 3.5–5.0)
Alkaline Phosphatase: 83 U/L (ref 38–126)
Anion gap: 9 (ref 5–15)
BUN: 34 mg/dL — ABNORMAL HIGH (ref 8–23)
CO2: 25 mmol/L (ref 22–32)
Calcium: 8.7 mg/dL — ABNORMAL LOW (ref 8.9–10.3)
Chloride: 102 mmol/L (ref 98–111)
Creatinine, Ser: 1.32 mg/dL — ABNORMAL HIGH (ref 0.61–1.24)
GFR, Estimated: 53 mL/min — ABNORMAL LOW (ref >60)
Glucose, Bld: 90 mg/dL (ref 70–99)
Potassium: 4.4 mmol/L (ref 3.5–5.1)
Sodium: 136 mmol/L (ref 135–145)
Total Bilirubin: 0.8 mg/dL (ref 0.0–1.2)
Total Protein: 5.7 g/dL — ABNORMAL LOW (ref 6.5–8.1)

## 2024-07-13 LAB — ECHOCARDIOGRAM COMPLETE
AR max vel: 0.75 cm2
AV Area VTI: 0.77 cm2
AV Area mean vel: 0.72 cm2
AV Mean grad: 33 mmHg
AV Peak grad: 61.8 mmHg
Ao pk vel: 3.93 m/s
Area-P 1/2: 3.85 cm2
Height: 66 in
MV VTI: 2.05 cm2
S' Lateral: 4.16 cm
Weight: 2913.6 [oz_av]

## 2024-07-13 LAB — CBC
HCT: 32.7 % — ABNORMAL LOW (ref 39.0–52.0)
Hemoglobin: 10.6 g/dL — ABNORMAL LOW (ref 13.0–17.0)
MCH: 29.1 pg (ref 26.0–34.0)
MCHC: 32.4 g/dL (ref 30.0–36.0)
MCV: 89.8 fL (ref 80.0–100.0)
Platelets: 211 K/uL (ref 150–400)
RBC: 3.64 MIL/uL — ABNORMAL LOW (ref 4.22–5.81)
RDW: 15.6 % — ABNORMAL HIGH (ref 11.5–15.5)
WBC: 5.5 K/uL (ref 4.0–10.5)
nRBC: 0 % (ref 0.0–0.2)

## 2024-07-13 MED ORDER — SALINE SPRAY 0.65 % NA SOLN
1.0000 | NASAL | Status: DC | PRN
  Administered 2024-07-13: 1 via NASAL
  Filled 2024-07-13 (×2): qty 44

## 2024-07-13 MED ORDER — ATORVASTATIN CALCIUM 10 MG PO TABS
20.0000 mg | ORAL_TABLET | Freq: Every day | ORAL | Status: DC
  Administered 2024-07-13: 20 mg via ORAL
  Filled 2024-07-13: qty 2

## 2024-07-13 MED ORDER — EMPAGLIFLOZIN 10 MG PO TABS
10.0000 mg | ORAL_TABLET | Freq: Every day | ORAL | Status: DC
  Administered 2024-07-13 – 2024-07-14 (×2): 10 mg via ORAL
  Filled 2024-07-13 (×2): qty 1

## 2024-07-13 MED ORDER — LOSARTAN POTASSIUM 25 MG PO TABS
25.0000 mg | ORAL_TABLET | Freq: Every day | ORAL | Status: DC
  Administered 2024-07-13: 25 mg via ORAL
  Filled 2024-07-13: qty 1

## 2024-07-13 MED ORDER — SPIRONOLACTONE 12.5 MG HALF TABLET
12.5000 mg | ORAL_TABLET | Freq: Every day | ORAL | Status: DC
  Administered 2024-07-13 – 2024-07-14 (×2): 12.5 mg via ORAL
  Filled 2024-07-13 (×2): qty 1

## 2024-07-13 MED ORDER — PERFLUTREN LIPID MICROSPHERE
1.0000 mL | INTRAVENOUS | Status: AC | PRN
  Administered 2024-07-13: 4 mL via INTRAVENOUS

## 2024-07-13 NOTE — Assessment & Plan Note (Signed)
 No current medications, follow up as outpatient

## 2024-07-13 NOTE — Assessment & Plan Note (Deleted)
 Continue with statin therapy.  ?

## 2024-07-13 NOTE — Plan of Care (Signed)

## 2024-07-13 NOTE — Progress Notes (Signed)
 Echocardiogram 2D Echocardiogram has been performed.  [name] 07/13/2024, 11:16 AM

## 2024-07-13 NOTE — Hospital Course (Addendum)
 [name] was admitted to the hospital with the working diagnosis of heart failure.   83 yo male with the past medical history of hypertension, hyperlipidemia, T2DM, coronary artery disease, CKD stage 3a, BPH, biplar and OSA who presented with dyspnea.  Reported one moth of worsening lower extremity edema, and dyspnea on exertion. Positive 15 lbs weight gain and expanding edema to his scrotum. Symptoms refractive to loop diuretic at home.  At the time of his initial physical examination his blood pressure was 127/76, HR 60, RR 18 and 02 saturation 95% Lungs with bilateral rales with no wheezing, heart with S1 and S2 present and regular with no gallops, rubs or murmurs, abdomen with no distention, positive lower extremity edema.   Na 137, K 4,4 CL 104 bicarbonate 24 glucose 98 bun 35 cr 1,42  BNP 4,159  Wbc 7.4 hgb 10.6 plt 224   Sars covid 19 negative  Influenza negative RSV negative   Chest radiograph with hypoinflation, positive cardiomegaly, bilateral hilar vascular congestion, fluid in the right fissure, bilateral pleural effusions.   EKG 60 bpm, normal axis, qtc 476, right bundle branch block, sinus rhythm with 1st degree AV block with no significant ST segment or T wave changes.   Patient was placed on furosemide for diuresis.  Echocardiogram with reduced LV systolic function and severe aortic stenosis.  10/03 worsening renal function, PICC Line placed SVO 69.3  10/04 continue diuresis. 10/05 volume status has improved.  10/07 having TAVR work up.

## 2024-07-13 NOTE — Assessment & Plan Note (Signed)
 No signs of urinary retention.

## 2024-07-13 NOTE — Assessment & Plan Note (Signed)
 Continue losartan  for blood pressure control.

## 2024-07-13 NOTE — Assessment & Plan Note (Addendum)
 AKI  Volume status is improving, renal function today with serum cr at 1,43 with K at 4,1 and serum bicarbonate at 36  Na 135   Continue diuresis and follow up renal function in am.

## 2024-07-13 NOTE — Assessment & Plan Note (Signed)
 Continue levothyroxine 

## 2024-07-13 NOTE — Assessment & Plan Note (Addendum)
 Glucose has been controlled Resume insulin sliding scale.  Continue with statin therapy

## 2024-07-13 NOTE — Care Management Obs Status (Signed)
 MEDICARE OBSERVATION STATUS NOTIFICATION   Patient Details  Name: [name] MRN: [id number] Date of Birth: January 23, 1941   Medicare Observation Status Notification Given:  Yes    [name] 07/13/2024, 8:49 AM

## 2024-07-13 NOTE — Assessment & Plan Note (Signed)
 Continue pantoprazole .

## 2024-07-13 NOTE — Assessment & Plan Note (Addendum)
 Echocardiogram reduced LV systolic function 30 to 35%, global hypokinesis, Akinetic mid and distal anterior wall and entire apex. Hypokinetic antero lateral wall, anterior septum, inferior wall, posterior wall, mid infero septal segment, basal anterior segment, and basal inferoseptal segment.  Grade II diastolic dysfunction, (pseudo normalization EA), interventricular septum is flattened in systole, RV systolic function with moderate reduction, mild RV enlargement, LA and RA with mild dilatation, mild mitral valve regurgitation, severe aortic valve stenosis.   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   Urine output documented 3,880 ml Systolic blood pressure 100 mmHg range.  SV02 68.3   Continue diuresis with furosemide 80 mg IV bid  Holding on spironolactone and SGLT 2 inh due to acutely reduced GFR.  Possible addition of further afterload reduction with ARB or Ace inh when renal function more stable.   Acute hypoxemic respiratory failure.  Follow up chest radiograph 10/02 continue with signs of pulmonary edema.  02 saturation today 96  % on 3 L.min per [place]  Patient will need supplemental home 02 for pulmonary hypertension.

## 2024-07-13 NOTE — Consult Note (Addendum)
 Cardiology Consultation   Patient ID: [name] MRN: [id number]; DOB: 1941-03-30  Admit date: 07/12/2024 Date of Consult: 07/13/2024  PCP:  Nichole Senior, MD   [facility] Providers Cardiologist:  New (Dr. Anner)    Patient Profile: [name] is a 83 y.o. male with a history of hypertension, hyperlipidemia, type 2 diabetes mellitus, CKD stage IIIa, hypothyroidism, GERD, BPH, obstructive sleep apnea, and bipolar disorder who is being seen 07/13/2024 for the evaluation of CHF and severe aortic stenosis at the request of Dr. Noralee.  History of Present Illness: [name] is a 83 year old male with the above history. No known cardiac history. He states he previously saw Dr. Morgan in the 1980s and it sounds like he may of undergone a cardiac catheterization at that time but was not told of any specific diagnosis.   Patient presented to the ED yesterday for further evaluation of shortness of breath and lower extremity edema. EKG showed, normal sinus rhythm, rate 60 bpm, with1st degree AV block, RBBB, and T wave inversions in leads V2-V3 as well as aVL.  BNP markedly elevated at 4,159.  Chest x-ray showed mild cardiomegaly with suggestion of mild vascular congestion and small bilateral pleural effusions with associated bibasilar atelectasis. WBC 7.4, Hgb 10.6, Plts 224. Na 137, K 4.4, Glucose 98, BUN 35, Cr 1.42. Respiratory panel negative for COVID-19, influenza A/B, and RSV.  He was admitted and started on IV Lasix.  Echo showed LVEF of 30-35% with global hypokinesis but disproportionately severe hypokinesis/akinesis in the mid to apical left anterior and anteroapical walls she is no LV thrombus was seen) and grade 2 diastolic dysfunction, moderate reduced RV function, severe aortic stenosis, and moderate severe MAC with mild MR. Cardiology consulted for further evaluation.  Patient reports progressive shortness of breath and lower extremity/scrotal swelling for the last 2 to 3  months. He reports intermittent dyspnea on exertion as well as significant significant orthopnea to the point that he is having to sleep in a recliner swells off and on PND.  He reports he is also developed pain in his scrotum because it is so swollen.  He states he felt like it was going to burst..  He reports a 20 pound weight gain over the last couple months.  No chest pain or palpitations.  He reports dizziness a couple years ago when he had severe hyponatremia but nothing outside that time.  No syncope.  He reports some recent nasal congestion/drainage but no cough.  No fever or GI symptoms.  No abnormal bleeding in urine or stools.  Does have a family history of heart disease.  He states his father did died in his 2s from a thrombosis.   No other known family history of heart disease.  He has a remote history of tobacco use but quit over 20 years ago (except for a brief period when he started smoking again 3 years ago when his wife was sick).  Has a history of alcohol use but nothing since 1968.  No recreational drug use.  I conducted my own interview in conjunction with David Goodrich, PA responses are relatively similar.  No notable differences.  Importantly, he denies any chest pain or pressure.  No irregular heartbeats.  His main symptoms have been worsening dyspnea, edema with weight gain, fatigue and scrotal swelling.  He says he already feels better with the IV diuresis and the swelling in his legs has improved.  Past Medical History:  Diagnosis Date   Hypertension  Past Surgical History:  Procedure Laterality Date   APPENDECTOMY         Scheduled Meds:  atorvastatin  20 mg Oral Daily   empagliflozin  10 mg Oral Daily   enoxaparin  (LOVENOX ) injection  40 mg Subcutaneous Q24H   furosemide  60 mg Intravenous BID   sodium chloride  flush  3 mL Intravenous Q12H   spironolactone  12.5 mg Oral Daily   Continuous Infusions:  PRN Meds: acetaminophen  **OR** acetaminophen ,  polyethylene glycol, sodium chloride   Allergies:   No Known Allergies  Social History:   Social History   Socioeconomic History   Marital status: Married    Spouse name: Not on file   Number of children: Not on file   Years of education: Not on file   Highest education level: Not on file  Occupational History   Not on file  Tobacco Use   Smoking status: Former    Types: Cigarettes   Smokeless tobacco: Never  Substance and Sexual Activity   Alcohol use: Yes   Drug use: Never   Sexual activity: Not on file  Other Topics Concern   Not on file  Social History Narrative   Not on file   Social Drivers of Health   Financial Resource Strain: Not on file  Food Insecurity: Patient Declined (07/12/2024)   Hunger Vital Sign    Worried About Running Out of Food in the Last Year: Patient declined    Ran Out of Food in the Last Year: Patient declined  Transportation Needs: Patient Declined (07/12/2024)   PRAPARE - Administrator, Civil Service (Medical): Patient declined    Lack of Transportation (Non-Medical): Patient declined  Physical Activity: Not on file  Stress: Not on file  Social Connections: Patient Declined (07/12/2024)   Social Connection and Isolation Panel    Frequency of Communication with Friends and Family: Patient declined    Frequency of Social Gatherings with Friends and Family: Patient declined    Attends Religious Services: Patient declined    Database administrator or Organizations: Patient declined    Attends Banker Meetings: Patient declined    Marital Status: Patient declined  Intimate Partner Violence: Not At Risk (07/12/2024)   Humiliation, Afraid, Rape, and Kick questionnaire    Fear of Current or Ex-Partner: No    Emotionally Abused: No    Physically Abused: No    Sexually Abused: No    Family History:   Family History  Problem Relation Age of Onset   CAD Father    Diabetes Mellitus II Maternal Grandmother      ROS:   Please see the history of present illness.    Physical Exam/Data: Vitals:   07/13/24 0820 07/13/24 1142 07/13/24 1500 07/13/24 2017  BP: 135/83 123/77  126/64  Pulse:  65  62  Resp: 15 20 18 20   Temp: (!) 97.1 F (36.2 C) 98.2 F (36.8 C) 97.8 F (36.6 C) (!) 97.5 F (36.4 C)  TempSrc: Axillary Oral Oral Oral  SpO2: 96% 96% 91% 90%  Weight:      Height:        Intake/Output Summary (Last 24 hours) at 07/13/2024 2023 Last data filed at 07/13/2024 1748 Gross per 24 hour  Intake 480 ml  Output 3425 ml  Net -2945 ml      07/13/2024    3:00 AM 07/12/2024    8:20 PM 02/09/2022    2:27 PM  Last 3 Weights  Weight (lbs)  182 lb 1.6 oz 183 lb 3.2 oz 161 lb 2.5 oz  Weight (kg) 82.6 kg 83.1 kg 73.1 kg     Body mass index is 29.39 kg/m.  General: 83 y.o. Caucasian male resting comfortably in no acute distress. HEENT: Normocephalic and atraumatic. Sclera clear. EOMs intact. Neck: Supple. Elevated JVD. Heart: RRR. III/VI systolic murmur.  Lungs: No increased work of breathing. Clear to ausculation bilaterally. No wheezes, rhonchi, or rales.  Abdomen: Soft, non-distended, and non-tender to palpation. Bowel sounds present in all 4 quadrants.  MSK: Normal strength and tone for age. Extremities: No clubbing, cyanosis, or edema.    Skin: Warm and dry. Neuro: Alert and oriented x3. No focal deficits. Psych: Normal affect. Responds appropriately.   EKG:  The EKG was personally reviewed and demonstrates:  Normal sinus rhythm, rate 60 bpm, with1st degree AV block, RBBB, and T wave inversions in leads V2-V3 as well as aVL.  Telemetry:  Telemetry was personally reviewed and demonstrates:  Normal sinus rhythm with PVCs. Rates 60s.   Relevant CV Studies:  Echocardiogram 07/13/2024: Moderate reduced LV function with EF 30 to 35%.  There is global hypokinesis but disproportionately severe hypokinesis/akinesis of the mid to apical left anterior and anteroseptal walls.  GR 2 DD with elevated LAP  also noted flattening of the IVS consistent with RV pressure overload.  RV systolic function is moderately reduced and slightly enlarged size.  Unable to assess PAP, but RAP estimated 15 mmHg..  Mild biatrial enlargement.  Mild MR.  Severe valve calcification with thickening-Severe Aortic Stenosis (mean AVG 33 mm agree with peak 61 mL mercury.  By VTI AVA 0.77 cm)   Laboratory Data: High Sensitivity Troponin:  No results for input(s): TROPONINIHS in the last 720 hours.   Chemistry Recent Labs  Lab 07/12/24 1514 07/12/24 1942 07/13/24 0341  NA 137  --  136  K 4.4  --  4.4  CL 104  --  102  CO2 24  --  25  GLUCOSE 98  --  90  BUN 35*  --  34*  CREATININE 1.42*  --  1.32*  CALCIUM 8.8*  --  8.7*  MG  --  1.8  --   GFRNONAA 49*  --  53*  ANIONGAP 9  --  9    Recent Labs  Lab 07/13/24 0341  PROT 5.7*  ALBUMIN 2.9*  AST 24  ALT 29  ALKPHOS 83  BILITOT 0.8   Lipids No results for input(s): CHOL, TRIG, HDL, LABVLDL, LDLCALC, CHOLHDL in the last 168 hours.  Hematology Recent Labs  Lab 07/12/24 1514 07/13/24 0341  WBC 7.4 5.5  RBC 3.67* 3.64*  HGB 10.6* 10.6*  HCT 34.0* 32.7*  MCV 92.6 89.8  MCH 28.9 29.1  MCHC 31.2 32.4  RDW 15.6* 15.6*  PLT 224 211   Thyroid  No results for input(s): TSH, FREET4 in the last 168 hours.  BNP Recent Labs  Lab 07/12/24 1530  BNP 4,159.6*    DDimer No results for input(s): DDIMER in the last 168 hours.  Radiology/Studies:   DG Chest 2 View Result Date: 07/12/2024 CLINICAL DATA:  Shortness of breath and edema. EXAM: CHEST - 2 VIEW COMPARISON:  01/25/2022 FINDINGS: Lungs are somewhat hypoinflated demonstrate mild bibasilar opacification compatible with small bilateral pleural effusions likely with associated basilar atelectasis. Mild hazy prominence of the central pulmonary vessels likely a degree of vascular congestion. Mild cardiomegaly. Remainder of the exam is unchanged. IMPRESSION: 1. Mild cardiomegaly with  suggestion of  mild vascular congestion. 2. Small bilateral pleural effusions with associated bibasilar atelectasis. Electronically Signed   By: Toribio Agreste M.D.   On: 07/12/2024 16:17     Assessment and Plan:  New Onset Acute HFrEF Severe Aortic Stenosis Patient presented with shortness of breath, orthopnea/ PND, and lower extremity/ scrotal edema. BNP markedly elevated at 4,159.  Chest x-ray showed mild cardiomegaly with suggestion of mild vascular congestion and small bilateral pleural effusions with associated bibasilar atelectasis. Echo showed LVEF of 30-35% with global hypokinesis but disproportionately severe hypokinesis/akinesis in the mid to apical left anterior and anteroapical walls she is no LV thrombus was seen) and grade 2 diastolic dysfunction, moderate reduced RV function, severe aortic stenosis, and moderate severe MAC with mild MR. He is being diuresed with IV Lasix with good urinary response. Renal function stable. He basely presents with acute combined systolic diastolic function-echo with evidence of RV pressure and volume overload with severely reduced EF.  Clinically he basely has anasarca with sacral pitting edema, ankle edema and scrotal edema.  He has rales on exam. - Still very volume overloaded. - Continue IV Lasix 60 mg twice daily. - Continue Spironolactone 12.5mg  daily. - Continue Jardiance 10mg  daily.  - Will start Losartan 25mg  daily and likely to switch to Entresto if BP tolerates this. We want to be careful not to drop is BP too much in setting of severe aortic stenosis. - Will hold off on starting beta-blocker until he is more compensated from a CHF standpoint. - Once he is more euvolemic, will need a R/ LHC and then evaluation by Structural Heart team. He is a very active 83 year old and doing yard work-up including push mowing and digging up his yard 3 months ago.  If not able to adequately diurese, may require inotrope assistance but for now I think we should  try to diurese in the standard fashion with IV diuretics and once stable, consider right and left heart cath with referral to the structural heart team.  Hypertension BP well controlled.  - Will treat in context of CHF as above.  Hyperlipidemia - Continue Lipitor 20mg  daily.  - Will check fasting lipid panel in the morning.  CKD Stage III Creatinine 1.42 on admission. Last known baseline in 2023 was 1.1 to 1.2.  - Creatinine stable at 1.32 with diuresis. - Continue to monitor closely.   Otherwise, per primary team: - Type 2 diabetes mellitus - Hypothyroidism - GERD - Obstructive sleep apnea - BPH - Bipolar disorder   Risk Assessment/Risk Scores:  [place]  Heart Association (NYHA) Functional Class NYHA Class III   For questions or updates, please contact [facility] Please consult [email or web address] for contact info under      Signed, David E Goodrich, PA-C  07/13/2024 8:23 PM    ATTENDING ATTESTATION  I have seen, examined and evaluated the patient this evening along with David Goodrich, PA.  After reviewing all the available data and chart, we discussed the patients laboratory, study & physical findings as well as symptoms in detail.  I agree with her findings, examination as well as impression recommendations as per our discussion.    Attending adjustments noted in italics.   83 year old gentleman otherwise active and healthy appearing until end of the summer when he started having worsening dyspnea and then developed pretty rapid onset of worsening dyspnea, significant edema involving his legs and scrotum etc. along with orthopnea and PND. Not unexpected he is found to have severely reduced EF, but  his extremely harsh 4/6 SEM at the RUSB with delayed carotid upstroke was also suggestive of the finding of severe aortic stenosis.  He basely needs a generalized tune up with diuresis and titration of GDMT for cardiomyopathy and then with significant reduced EF  and aortic stenosis would likely benefit from TAVR.  He is already been started on a pretty good regimen with IV diuresis, Jardiance spironolactone.  I agree with adding afterload reduction and will start low with losartan and then titrate up to Entresto when tolerated.  As long as he is able to tolerate titration of GDMT pillars for CHF, we can hold off on supportive measures.  Once we are able to get him more diuresed and with GDMT titrated adequately, I think we need to consider initiating the TAVR workup.  It would not be appropriate at this time until more stabilized.  We will follow along and make additional changes accordingly.  Once stable will bring in discussion with TAVR team and plus or minus advanced heart failure service.    Alm MICAEL Clay, MD, MS Alm Clay, M.D., M.S. Interventional Cardiologist  [facility] Pager # [phone]

## 2024-07-13 NOTE — Plan of Care (Signed)
  Problem: Clinical Measurements: Goal: Ability to maintain clinical measurements within normal limits will improve Outcome: Progressing Goal: Will remain free from infection Outcome: Progressing Goal: Diagnostic test results will improve Outcome: Progressing Goal: Respiratory complications will improve Outcome: Progressing Goal: Cardiovascular complication will be avoided Outcome: Progressing   Problem: Health Behavior/Discharge Planning: Goal: Ability to manage health-related needs will improve Outcome: Progressing   Problem: Activity: Goal: Risk for activity intolerance will decrease Outcome: Progressing   Problem: Coping: Goal: Level of anxiety will decrease Outcome: Progressing

## 2024-07-13 NOTE — Assessment & Plan Note (Addendum)
 Cpap

## 2024-07-13 NOTE — Progress Notes (Signed)
  Progress Note   Patient: [name] FMW:[id number] DOB: 09/28/1941 DOA: 07/12/2024     0 DOS: the patient was seen and examined on 07/13/2024   Brief hospital course: Mr. Bommarito was admitted to the hospital with the working diagnosis of heart failure.   83 yo male with the past medical history of hypertension, hyperlipidemia, T2DM, coronary artery disease, CKD stage 3a, BPH, biplar and OSA who presented with dyspnea.  Reported one moth of worsening lower extremity edema, and dyspnea on exertion. Positive 15 lbs weight gain and expanding edema to his scrotum. Symptoms refractive to loop diuretic at home.  At the time of his initial physical examination his blood pressure was 127/76, HR 60, RR 18 and 02 saturation 95% Lungs with bilateral rales with no wheezing, heart with S1 and S2 present and regular with no gallops, rubs or murmurs, abdomen with no distention, positive lower extremity edema.   Assessment and Plan: * Acute CHF (HCC) Continue volume overloaded.  Echocardiogram is pending reading   Urine output documented 1,900 ml Systolic blood pressure 130 mmHg range.   Plan to continue diuresis with furosemide 60 mg IV bid Add spironolactone and SGLT 2 inh.  Possible addition of further afterload reduction with ARB or Ace inh   Essential hypertension Continue blood pressure monitoring Continue aggressive diuresis.   Stage 3a chronic kidney disease (HCC) Renal function today with serum cr at 1.32 with K at 4,4 and serum bicarbonate at 25  Na 136 and Mg 1,8   Continue diuresis and follow up renal function and electrolytes in am Avoid hypotension and nephrotoxic medications  Type 2 diabetes mellitus with hyperlipidemia (HCC) Glucose has been controlled.  Hold on insulin therapy.   Continue with statin therapy  Acquired hypothyroidism Continue levothyroxine.   BPH (benign prostatic hyperplasia) No signs of urinary retention   GERD Continue pantoprazole.   Bipolar disorder  (HCC) No current medications, follow up as outpatient   Sleep apnea C pap        Subjective: Patient is feeling better, dyspnea and edema are improving, but not yet back to baseline   Physical Exam: Vitals:   07/12/24 2258 07/13/24 0300 07/13/24 0820 07/13/24 1142  BP: 119/77 121/75 135/83 123/77  Pulse: (!) 57 61  65  Resp: 17 13 15 20   Temp: 97.6 F (36.4 C) (!) 97.4 F (36.3 C) (!) 97.1 F (36.2 C) 98.2 F (36.8 C)  TempSrc: Oral Oral Axillary Oral  SpO2: 99% 98% 96% 96%  Weight:  82.6 kg    Height:       Neurology awake and alert ENT with mild pallor with no icterus Cardiovascular with S1 and S2 present and regular, positive systolic murmur at the base and apex,  Positive moderate JVD Respiratory with bilateral rales with no wheezing, or rhonchi Abdomen with no distention, soft and non tender Positive lower extremity edema ++  Data Reviewed:    Family Communication: no family at the bedside   Disposition: Status is: Observation The patient remains OBS appropriate and will d/c before 2 midnights.  Planned Discharge Destination: Home     Author: Elidia Toribio Furnace, MD 07/13/2024 1:05 PM  For on call review [email or web address].

## 2024-07-14 ENCOUNTER — Inpatient Hospital Stay (HOSPITAL_COMMUNITY)

## 2024-07-14 ENCOUNTER — Telehealth (HOSPITAL_COMMUNITY): Payer: Self-pay | Admitting: Pharmacy Technician

## 2024-07-14 ENCOUNTER — Other Ambulatory Visit (HOSPITAL_COMMUNITY): Payer: Self-pay

## 2024-07-14 DIAGNOSIS — N1831 Chronic kidney disease, stage 3a: Secondary | ICD-10-CM | POA: Diagnosis not present

## 2024-07-14 DIAGNOSIS — R918 Other nonspecific abnormal finding of lung field: Secondary | ICD-10-CM | POA: Diagnosis not present

## 2024-07-14 DIAGNOSIS — I5082 Biventricular heart failure: Secondary | ICD-10-CM

## 2024-07-14 DIAGNOSIS — I5023 Acute on chronic systolic (congestive) heart failure: Secondary | ICD-10-CM | POA: Diagnosis not present

## 2024-07-14 DIAGNOSIS — I350 Nonrheumatic aortic (valve) stenosis: Secondary | ICD-10-CM

## 2024-07-14 DIAGNOSIS — E1169 Type 2 diabetes mellitus with other specified complication: Secondary | ICD-10-CM | POA: Diagnosis not present

## 2024-07-14 DIAGNOSIS — J9811 Atelectasis: Secondary | ICD-10-CM | POA: Diagnosis not present

## 2024-07-14 DIAGNOSIS — I10 Essential (primary) hypertension: Secondary | ICD-10-CM | POA: Diagnosis not present

## 2024-07-14 DIAGNOSIS — I517 Cardiomegaly: Secondary | ICD-10-CM | POA: Diagnosis not present

## 2024-07-14 DIAGNOSIS — I2609 Other pulmonary embolism with acute cor pulmonale: Secondary | ICD-10-CM | POA: Diagnosis not present

## 2024-07-14 LAB — MAGNESIUM: Magnesium: 1.7 mg/dL (ref 1.7–2.4)

## 2024-07-14 LAB — BASIC METABOLIC PANEL WITH GFR
Anion gap: 11 (ref 5–15)
BUN: 40 mg/dL — ABNORMAL HIGH (ref 8–23)
CO2: 25 mmol/L (ref 22–32)
Calcium: 9 mg/dL (ref 8.9–10.3)
Chloride: 100 mmol/L (ref 98–111)
Creatinine, Ser: 1.69 mg/dL — ABNORMAL HIGH (ref 0.61–1.24)
GFR, Estimated: 40 mL/min — ABNORMAL LOW (ref >60)
Glucose, Bld: 126 mg/dL — ABNORMAL HIGH (ref 70–99)
Potassium: 4.4 mmol/L (ref 3.5–5.1)
Sodium: 136 mmol/L (ref 135–145)

## 2024-07-14 LAB — LIPID PANEL
Cholesterol: 131 mg/dL (ref 0–200)
HDL: 49 mg/dL (ref >40)
LDL Cholesterol: 77 mg/dL (ref 0–99)
Total CHOL/HDL Ratio: 2.7 ratio
Triglycerides: 23 mg/dL (ref <150)
VLDL: 5 mg/dL (ref 0–40)

## 2024-07-14 MED ORDER — ATORVASTATIN CALCIUM 80 MG PO TABS
80.0000 mg | ORAL_TABLET | Freq: Every day | ORAL | Status: DC
  Administered 2024-07-14 – 2024-07-21 (×8): 80 mg via ORAL
  Filled 2024-07-14 (×8): qty 1

## 2024-07-14 MED ORDER — MAGNESIUM SULFATE 2 GM/50ML IV SOLN
2.0000 g | Freq: Once | INTRAVENOUS | Status: AC
  Administered 2024-07-14: 2 g via INTRAVENOUS
  Filled 2024-07-14: qty 50

## 2024-07-14 NOTE — Progress Notes (Signed)
 Progress Note   Patient: [name] FMW:[id number] DOB: 1940-12-18 DOA: 07/12/2024     1 DOS: the patient was seen and examined on 07/14/2024   Brief hospital course: Mr. Sabala was admitted to the hospital with the working diagnosis of heart failure.   83 yo male with the past medical history of hypertension, hyperlipidemia, T2DM, coronary artery disease, CKD stage 3a, BPH, biplar and OSA who presented with dyspnea.  Reported one moth of worsening lower extremity edema, and dyspnea on exertion. Positive 15 lbs weight gain and expanding edema to his scrotum. Symptoms refractive to loop diuretic at home.  At the time of his initial physical examination his blood pressure was 127/76, HR 60, RR 18 and 02 saturation 95% Lungs with bilateral rales with no wheezing, heart with S1 and S2 present and regular with no gallops, rubs or murmurs, abdomen with no distention, positive lower extremity edema.   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 Hypokinetic antero lateral wall, anterior septum, inferior wall, posterior wall, mid infero septal segment, basal anterior segment, and basal inferoseptal segment.  Grade II diastolic dysfunction, (pseudo normalization EA), interventricular septum is flattened in systole, RV systolic function with moderate reduction, mild RV enlargement, LA and RA with mild dilatation, mild mitral valve regurgitation, severe aortic valve stenosis.   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   Urine output documented 3,076 ml Systolic blood pressure 130 mmHg range.   Plan to continue diuresis with furosemide 60 mg IV bid Continue with spironolactone and SGLT 2 inh.  Possible addition of further afterload reduction with ARB or Ace inh   Acute hypoxemic respiratory failure.  Today continue to  need supplemental 02 per [place]. Will check chest radiograph, continue diuresis for now.   Essential hypertension Continue blood pressure monitoring Continue aggressive diuresis.   Stage 3a chronic kidney disease (HCC) AKI Improved volume status but not yet back to baseline  Renal function today with serum cr at 1,69 with K at 4.4 and serum bicarbonate at 25  Na 136 and Mg 1.7   Continue diuresis and follow up renal function and electrolytes in am Add 2 g mag sulfate  Avoid hypotension and nephrotoxic medications  Type 2 diabetes mellitus with hyperlipidemia (HCC) Glucose has been controlled.  Hold on insulin therapy.   Continue with statin therapy  Acquired hypothyroidism Continue levothyroxine.   BPH (benign prostatic hyperplasia) No signs of urinary retention   GERD Continue pantoprazole.   Bipolar disorder (HCC) No current medications, follow up as outpatient   Sleep apnea C pap        Subjective: Patient is feeling better, not yet back to baseline, still using supplemental 02 per [place]   Physical Exam: Vitals:   07/13/24 2017 07/13/24 2344 07/14/24 0404 07/14/24 0804  BP: 126/64 134/88 114/80 101/60  Pulse: 62 60 (!) 59 60  Resp: 20 17 18 17   Temp: (!) 97.5 F (36.4 C) 97.6 F (36.4 C) (!) 97.5 F (36.4 C) (!) 97 F (36.1 C)  TempSrc: Oral Oral Oral Oral  SpO2: 90% 98% 96% 95%  Weight:      Height:       Neurology awake and alert, mild pallor ENT with mild pallor Cardiovascular with S1 and S2 present and regular, positive systolic murmur at the base Mild JVD Respiratory rales at bases with no wheezing,  no rhonchi  Abdomen with no distention  Trace lower extremity edema   Data Reviewed:   Family Communication: no family at the bedside   Disposition: Status is: Inpatient Remains inpatient appropriate because: recovering heart failure    Planned Discharge Destination: Home     Author: Elidia Toribio Furnace, MD 07/14/2024 10:15 AM  For  on call review [email or web address].

## 2024-07-14 NOTE — Evaluation (Signed)
 Occupational Therapy Evaluation Patient Details Name: [name] MRN: [id number] DOB: 1941/04/04 Today's Date: 07/14/2024   History of Present Illness   Pt is a 83 y.o male admitted 9/30 for acute CHF. Chest x-ray showed vascular exacerbation. PMH: HTN, HLD, DM, hypothyroidism, CAD, CKD, bipolar dx, OSA     Clinical Impressions Pt admitted based on above, and was seen based on problem list below. PTA pt was independent with ADLs and IADLs. Today pt is requiring set up  to min  assist for ADLs. Bed mobility and functional transfers are  CGA with use of single UE support. Pt on 2L O2, during session pt destated to 88%, but with rest break pt able to recover to 94%. Anticipate pt will progress well, recommending HHOT upon d/c. OT will continue to follow acutely to maximize functional independence.        If plan is discharge home, recommend the following:   A little help with walking and/or transfers;A little help with bathing/dressing/bathroom     Functional Status Assessment   Patient has had a recent decline in their functional status and demonstrates the ability to make significant improvements in function in a reasonable and predictable amount of time.     Equipment Recommendations   None recommended by OT      Precautions/Restrictions   Precautions Precautions: Fall Recall of Precautions/Restrictions: Intact Restrictions Weight Bearing Restrictions Per Provider Order: No     Mobility Bed Mobility Overal bed mobility: Modified Independent     General bed mobility comments: Increased time and effort    Transfers Overall transfer level: Needs assistance Equipment used: None Transfers: Sit to/from Stand Sit to Stand: Contact guard assist           General transfer comment: CGA for balance, flexed trunk, use of IV pole for single UE support      Balance Overall balance assessment: Needs assistance Sitting-balance support: Feet supported, No upper  extremity supported Sitting balance-Leahy Scale: Fair     Standing balance support: No upper extremity supported, During functional activity Standing balance-Leahy Scale: Poor Standing balance comment: Would benefit from BUE support       ADL either performed or assessed with clinical judgement   ADL Overall ADL's : Needs assistance/impaired Eating/Feeding: Set up;Sitting   Grooming: Contact guard assist;Standing           Upper Body Dressing : Set up;Standing;Sitting;Bed level   Lower Body Dressing: Minimal assistance;Sit to/from stand Lower Body Dressing Details (indicate cue type and reason): Assist for socks Toilet Transfer: Contact guard assist;Ambulation Toilet Transfer Details (indicate cue type and reason): Use of IV pole, CGA for balance Toileting- Clothing Manipulation and Hygiene: Contact guard assist;Sit to/from stand       Functional mobility during ADLs: Contact guard assist General ADL Comments: Decreased activity tolerance, poor balance     Vision Baseline Vision/History: 0 No visual deficits Patient Visual Report: No change from baseline Vision Assessment?: No apparent visual deficits            Pertinent Vitals/Pain Pain Assessment Pain Assessment: Faces Faces Pain Scale: Hurts a little bit Pain Location: Scrotum Pain Descriptors / Indicators: Discomfort Pain Intervention(s): Monitored during session     Extremity/Trunk Assessment Upper Extremity Assessment Upper Extremity Assessment: Generalized weakness   Lower Extremity Assessment Lower Extremity Assessment: Defer to PT evaluation   Cervical / Trunk Assessment Cervical / Trunk Assessment: Kyphotic   Communication Communication Communication: No apparent difficulties Factors Affecting Communication: Hearing impaired   Cognition Arousal:  Alert Behavior During Therapy: WFL for tasks assessed/performed Cognition: No apparent impairments     Following commands: Intact        Cueing  General Comments   Cueing Techniques: Verbal cues  On 2L pt destat to 88 with mobility, but with seated rest break and cues for pursed lip breathing able to recover to 94           Home Living Family/patient expects to be discharged to:: Private residence Living Arrangements: Spouse/significant other;Children Available Help at Discharge: Family;Available PRN/intermittently Type of Home: House Home Access: Stairs to enter [employer] of Steps: 4 Entrance Stairs-Rails: None (Son is in process of building rails) Home Layout: One level     Bathroom Shower/Tub: Chief Strategy Officer: Handicapped height Bathroom Accessibility: No   Home Equipment: Shower seat;Grab bars - tub/shower;Grab bars - toilet;Patent examiner (4 wheels)   Additional Comments: Wife is on oxygen but requires assist      Prior Functioning/Environment Prior Level of Function : Independent/Modified Independent;Driving;History of Falls (last six months)         Mobility Comments: no AD, x1 fall approx 2 months ago ADLs Comments: Ind, assists wife    OT Problem List: Decreased strength;Decreased range of motion;Decreased activity tolerance;Impaired balance (sitting and/or standing);Decreased knowledge of use of DME or AE;Cardiopulmonary status limiting activity   OT Treatment/Interventions: Self-care/ADL training;Therapeutic exercise;Energy conservation;DME and/or AE instruction;Therapeutic activities;Patient/family education;Balance training      OT Goals(Current goals can be found in the care plan section)   Acute Rehab OT Goals Patient Stated Goal: To go home OT Goal Formulation: With patient Time For Goal Achievement: 07/28/24 Potential to Achieve Goals: Good   OT Frequency:  Min 2X/week       AM-PAC OT 6 Clicks Daily Activity     Outcome Measure Help from another person eating meals?: None Help from another person taking care of personal grooming?: A  Little Help from another person toileting, which includes using toliet, bedpan, or urinal?: A Little Help from another person bathing (including washing, rinsing, drying)?: A Little Help from another person to put on and taking off regular upper body clothing?: A Little Help from another person to put on and taking off regular lower body clothing?: A Little 6 Click Score: 19   End of Session Equipment Utilized During Treatment: Gait belt;Oxygen Nurse Communication: Mobility status  Activity Tolerance: Patient tolerated treatment well Patient left: in chair;with call bell/phone within reach  OT Visit Diagnosis: Unsteadiness on feet (R26.81);Other abnormalities of gait and mobility (R26.89);Muscle weakness (generalized) (M62.81)                Time: 8963-8878 OT Time Calculation (min): 45 min Charges:  OT General Charges $OT Visit: 1 Visit OT Evaluation $OT Eval Moderate Complexity: 1 Mod OT Treatments $Therapeutic Activity: 23-37 mins  Adrianne BROCKS, OT  Acute Rehabilitation Services Office [phone] Secure chat preferred   Adrianne GORMAN Savers 07/14/2024, 12:12 PM

## 2024-07-14 NOTE — Evaluation (Signed)
 Physical Therapy Evaluation Patient Details Name: [name] MRN: [id number] DOB: 1941-10-08 Today's Date: 07/14/2024  History of Present Illness  Pt is a 83 y.o male admitted 9/30 for acute CHF. Chest x-ray showed vascular exacerbation. PMH: HTN, HLD, DM, hypothyroidism, CAD, CKD, bipolar dx, OSA  Clinical Impression  Pt admitted with above diagnosis. Independent PTA. Pt able to transfer with CGA and ambulate with supervision up to 145 feet today. Utilized rollator for majority of distance which pt found to be helpful. SpO2 85% on RA at rest, mid 90s on 2L supplemental O2. SpO2 92% on 2L while ambulating (moderate dyspnea.) . Pt currently with functional limitations due to the deficits listed below (see PT Problem List). Pt will benefit from acute skilled PT to increase their independence and safety with mobility to allow discharge.           If plan is discharge home, recommend the following: A little help with walking and/or transfers;A little help with bathing/dressing/bathroom;Assistance with cooking/housework;Assist for transportation;Help with stairs or ramp for entrance   Can travel by private vehicle        Equipment Recommendations None recommended by PT  Recommendations for Other Services       Functional Status Assessment Patient has had a recent decline in their functional status and demonstrates the ability to make significant improvements in function in a reasonable and predictable amount of time.     Precautions / Restrictions Precautions Precautions: Fall Recall of Precautions/Restrictions: Intact Restrictions Weight Bearing Restrictions Per Provider Order: No      Mobility  Bed Mobility               General bed mobility comments: in recliner    Transfers Overall transfer level: Needs assistance Equipment used: Rollator (4 wheels) Transfers: Sit to/from Stand Sit to Stand: Contact guard assist           General transfer comment: CGA for  safety, educated on set up and safety with Rollator for support. Cues for hand placement. Performed from recliner and rollator seat (pushed against wall.)    Ambulation/Gait Ambulation/Gait assistance: Supervision Gait Distance (Feet): 145 Feet Assistive device: Rollator (4 wheels) Gait Pattern/deviations: Step-through pattern, Decreased stride length, Trunk flexed Gait velocity: WFL Gait velocity interpretation: >2.62 ft/sec, indicative of community ambulatory   General Gait Details: Good pace, grossly stable with rollator, without device in room pt furniture walks. Educated on safe AD use with rollator, proximity, posture, and symptom awareness (dyspneic) with SpO2 92% on 2L supplemental O2. No buckling or LOB noted.  Stairs            Wheelchair Mobility     Tilt Bed    Modified Rankin (Stroke Patients Only)       Balance Overall balance assessment: Needs assistance Sitting-balance support: Feet supported, No upper extremity supported Sitting balance-Leahy Scale: Fair     Standing balance support: No upper extremity supported, During functional activity Standing balance-Leahy Scale: Fair Standing balance comment: More stable with UE support                             Pertinent Vitals/Pain Pain Assessment Pain Assessment: No/denies pain    Home Living Family/patient expects to be discharged to:: Private residence Living Arrangements: Spouse/significant other;Children Available Help at Discharge: Family;Available PRN/intermittently Type of Home: House Home Access: Stairs to enter Entrance Stairs-Rails: None (Son is in process of building rails) Secretary/administrator of Steps: 4  Home Layout: One level Home Equipment: Shower seat;Grab bars - tub/shower;Grab bars - toilet;Patent examiner (4 wheels) Additional Comments: Wife is on oxygen but requires assist    Prior Function Prior Level of Function : Independent/Modified  Independent;Driving;History of Falls (last six months)             Mobility Comments: no AD, x1 fall approx 2 months ago ADLs Comments: Ind, assists wife     Extremity/Trunk Assessment   Upper Extremity Assessment Upper Extremity Assessment: Defer to OT evaluation    Lower Extremity Assessment Lower Extremity Assessment: Generalized weakness    Cervical / Trunk Assessment Cervical / Trunk Assessment: Kyphotic  Communication   Communication Communication: Impaired Factors Affecting Communication: Hearing impaired    Cognition Arousal: Alert Behavior During Therapy: WFL for tasks assessed/performed   PT - Cognitive impairments: No apparent impairments                         Following commands: Intact       Cueing Cueing Techniques: Verbal cues     General Comments General comments (skin integrity, edema, etc.): SpO2 85% on RA, mid 90s on 2L supplemental O2 at rest. SpO2 92% on 2L while ambulating (moderate dyspnea.)    Exercises     Assessment/Plan    PT Assessment Patient needs continued PT services  PT Problem List Decreased strength;Decreased activity tolerance;Decreased balance;Decreased mobility;Cardiopulmonary status limiting activity       PT Treatment Interventions DME instruction;Gait training;Stair training;Functional mobility training;Therapeutic activities;Therapeutic exercise;Balance training;Neuromuscular re-education;Patient/family education    PT Goals (Current goals can be found in the Care Plan section)  Acute Rehab PT Goals Patient Stated Goal: get well, return home PT Goal Formulation: With patient Time For Goal Achievement: 07/28/24 Potential to Achieve Goals: Good    Frequency Min 2X/week     Co-evaluation               AM-PAC PT 6 Clicks Mobility  Outcome Measure Help needed turning from your back to your side while in a flat bed without using bedrails?: None Help needed moving from lying on your back to  sitting on the side of a flat bed without using bedrails?: A Little Help needed moving to and from a bed to a chair (including a wheelchair)?: A Little Help needed standing up from a chair using your arms (e.g., wheelchair or bedside chair)?: A Little Help needed to walk in hospital room?: A Little Help needed climbing 3-5 steps with a railing? : A Little 6 Click Score: 19    End of Session Equipment Utilized During Treatment: Gait belt;Oxygen Activity Tolerance: Patient tolerated treatment well Patient left: in chair;with call bell/phone within reach (as found)   PT Visit Diagnosis: Other abnormalities of gait and mobility (R26.89);Unsteadiness on feet (R26.81);Muscle weakness (generalized) (M62.81);Difficulty in walking, not elsewhere classified (R26.2)    Time: 8789-8753 PT Time Calculation (min) (ACUTE ONLY): 36 min   Charges:   PT Evaluation $PT Eval Low Complexity: 1 Low PT Treatments $Gait Training: 8-22 mins PT General Charges $$ ACUTE PT VISIT: 1 Visit         Leontine Roads, PT, DPT [facility]  Rehabilitation Services Physical Therapist Office: [phone] Website: [email or web address]   Leontine GORMAN Roads 07/14/2024, 2:31 PM

## 2024-07-14 NOTE — Progress Notes (Addendum)
 Progress Note  Patient Name: [name] Date of Encounter: 07/14/2024 Hernando [name] Cardiologist: Alm Clay, MD   Interval Summary   Patient shared he did not sleep well last night, he had a urinary accident in bed- was unable to make it to the restroom.  Symptoms of orthopnea/PND/ and edema ongoing though improved.   He shared significant concern over the cost of the TAVR operation/the future. He expressed he would like to continue to live by having medical assistance such as the operation, however not at the expend of being unable to pay bills and help his family.   Vital Signs Vitals:   07/13/24 1500 07/13/24 2017 07/13/24 2344 07/14/24 0404  BP:  126/64 134/88 114/80  Pulse:  62 60 (!) 59  Resp: 18 20 17 18   Temp: 97.8 F (36.6 C) (!) 97.5 F (36.4 C) 97.6 F (36.4 C) (!) 97.5 F (36.4 C)  TempSrc: Oral Oral Oral Oral  SpO2: 91% 90% 98% 96%  Weight:      Height:        Intake/Output Summary (Last 24 hours) at 07/14/2024 0749 Last data filed at 07/13/2024 2300 Gross per 24 hour  Intake 480 ml  Output 3075 ml  Net -2595 ml      07/13/2024    3:00 AM 07/12/2024    8:20 PM 02/09/2022    2:27 PM  Last 3 Weights  Weight (lbs) 182 lb 1.6 oz 183 lb 3.2 oz 161 lb 2.5 oz  Weight (kg) 82.6 kg 83.1 kg 73.1 kg     CARDIAC STUDIES Moderate to severely reduced LV function with a EF of 30 to 35%.  Disproportionate severe hypokinesis to akinesis of the mid apical left anterior and anteroseptal walls.  GR 2 DD.  No LV thrombus.  Elevated left atrial pressures.  Moderate reduced RV function with flattened IVS and systole consistent with RV pressure overload.  Mild biatrial enlargement.  Moderate MAC.  Severe aortic calcification with severe aortic stenosis (mean AVG 33 mmHg)    Telemetry/ECG  Sinus bradycardia with 1st degree AV block HR 50 - Personally Reviewed  Physical Exam  GEN: No acute distress.   Neck: JVD Cardiac: RRR, harsh systolic murmur heard bets in  pulmonic area 4/6 Respiratory: +crackles at bases, worse on right side GI: Soft, nontender, non-distended  MS: 1+ pitting edema to BLE  Assessment & Plan  [name] is a 83 y.o. male with a history of hypertension, hyperlipidemia, type 2 diabetes mellitus, CKD stage IIIa, hypothyroidism, GERD, BPH, obstructive sleep apnea, and bipolar disorder who presented to the ED on 9/30 for shortness of breath [DOE, significant orthopnea & PND] and lower extremity edema/scrotal swelling for 2-3 months with 20 lb weight gain. BNP 4159. Imaging showed evidence of volume overload. He was started on IV diuresis. Echo this admission showed showed LVEF of 30-35% with global hypokinesis but disproportionately severe hypokinesis/akinesis in the mid to apical left anterior and anteroapical walls G2 DD, moderate reduced RV function, severe aortic stenosis, and moderate severe MAC with mild MR . Cardiology consulted.    => Echo consistent with moderate to severely reduced LVEF of 30 to 35% but also flattened interventricular septum consistent with RV pressure and volume overload suggesting cor pulmonale together equals biventricular failure.  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 If this occurs again with diuresis could consider condom cath.  On exam still appears volume up and still requiring supplemental oxygen => With creatinine increased, will hold ARB but continue diuretics.  Cr bump from yesterday, 1.3 -> 1.69.   Continue IV lasix 60 mg Continue Spironolactone 12.5mg  daily. Continue Jardiance 10mg  daily.  Hold Losartan 25 mg this am, given am blood pressure will hold on transitioning to entresto as we do not want to drop BP too much with his aortic stenosis Will continue to defer BB  for now as he is decompensated and in sinus bradycardia with 1st degree AV block  Patient will benefit from a TAVR work-up, however is still not optimized. Will reach out to team to see if they have information regarding finances with the TAVR procedure. Will also reach out to primary about having case management work with patient.   Hypertension BP: 101/60 GDMT as above  Hyperlipidemia LDL  77  HDL 49 Increase lipitor 20 mg to 80 mg, LDL goal < 70  CKD On admission 1.42. Baseline unclear with lack of data Today 1.69 up from 1.32 Will stop losartan this am  Per primary T2DM Hypothyroidism GERD  OSA BPH Bipolar disorder    For questions or updates, please contact [name] Please consult [email or web address] for contact info under       Signed, Leontine LOISE Salen, PA-C    ATTENDING ATTESTATION  I have seen, examined and evaluated the patient this AM on rounds along with Leontine Salen, PA-C.  After reviewing all the available data and chart, we discussed the patients laboratory, study & physical findings as well as symptoms in detail.  I agree with her findings, examination as well as impression recommendations as per our discussion.    Attending adjustments noted in italics.   Unfortunately scenario that this gentleman got to the point of such severe biventricular failure with reduced EF and evidence of  suggests significant volume overload.  He is aortic stenosis is now to the point of ~severe AS.  For now we simply need to get him diuresed however he does have slightly increasing creatinine which makes it more difficult.  Will hold the ARB to allow for more blood pressure.  For now our only treatment option is to continue with diuresis allowing for additional pressures by holding the ARB.  We may need to consider advanced heart failure consultation to assist if creatinine worsens. Once stabilized I suspect it would not be unreasonable to initiate pre-TAVR evaluation  while he is inpatient however I do not think that he would be undergoing TAVR during this hospitalization.  Will continue to follow and elicit the input of the TAVR team.     Alm MICAEL Clay, MD, MS Alm Clay, M.D., M.S. Interventional Cardiologist  [facility] Pager # [phone]

## 2024-07-14 NOTE — Telephone Encounter (Signed)
 Patient Product/process development scientist completed.    The patient is insured through [employer]. Patient has Medicare and is not eligible for a copay card, but may be able to apply for patient assistance or Medicare RX Payment Plan (Patient Must reach out to their plan, if eligible for payment plan), if available.    Ran test claim for sacubitril-valsartan (Entresto) 24-26 mg and the current 30 day co-pay is $4.90.  Ran test claim for Farxiga 10 mg and the current 30 day co-pay is $12.15  Ran test claim for Jardiance 10 mg and the current 30 day co-pay is $12.15  This test claim was processed through [employer]- copay amounts may vary at other pharmacies due to [employer], or as the patient moves through the different stages of their insurance plan.     Reyes Sharps, CPHT Pharmacy Technician III Certified Patient Advocate [facility] Patient Advocate Team Direct Number: [phone]  Fax: [phone]

## 2024-07-15 ENCOUNTER — Other Ambulatory Visit: Payer: Self-pay

## 2024-07-15 ENCOUNTER — Inpatient Hospital Stay (HOSPITAL_COMMUNITY)

## 2024-07-15 DIAGNOSIS — I2609 Other pulmonary embolism with acute cor pulmonale: Secondary | ICD-10-CM | POA: Diagnosis not present

## 2024-07-15 DIAGNOSIS — N170 Acute kidney failure with tubular necrosis: Secondary | ICD-10-CM

## 2024-07-15 DIAGNOSIS — I5021 Acute systolic (congestive) heart failure: Secondary | ICD-10-CM

## 2024-07-15 DIAGNOSIS — N1831 Chronic kidney disease, stage 3a: Secondary | ICD-10-CM | POA: Diagnosis not present

## 2024-07-15 DIAGNOSIS — E1169 Type 2 diabetes mellitus with other specified complication: Secondary | ICD-10-CM | POA: Diagnosis not present

## 2024-07-15 DIAGNOSIS — I5023 Acute on chronic systolic (congestive) heart failure: Secondary | ICD-10-CM | POA: Diagnosis not present

## 2024-07-15 DIAGNOSIS — I5082 Biventricular heart failure: Secondary | ICD-10-CM | POA: Diagnosis not present

## 2024-07-15 DIAGNOSIS — I10 Essential (primary) hypertension: Secondary | ICD-10-CM | POA: Diagnosis not present

## 2024-07-15 LAB — BASIC METABOLIC PANEL WITH GFR
Anion gap: 13 (ref 5–15)
BUN: 45 mg/dL — ABNORMAL HIGH (ref 8–23)
CO2: 30 mmol/L (ref 22–32)
Calcium: 9.1 mg/dL (ref 8.9–10.3)
Chloride: 94 mmol/L — ABNORMAL LOW (ref 98–111)
Creatinine, Ser: 1.91 mg/dL — ABNORMAL HIGH (ref 0.61–1.24)
GFR, Estimated: 34 mL/min — ABNORMAL LOW (ref >60)
Glucose, Bld: 158 mg/dL — ABNORMAL HIGH (ref 70–99)
Potassium: 4.8 mmol/L (ref 3.5–5.1)
Sodium: 137 mmol/L (ref 135–145)

## 2024-07-15 LAB — COOXEMETRY PANEL
Carboxyhemoglobin: 1.7 % — ABNORMAL HIGH (ref 0.5–1.5)
Methemoglobin: <0.7 % (ref 0.0–1.5)
O2 Saturation: 69.3 %
Total hemoglobin: 10.8 g/dL — ABNORMAL LOW (ref 12.0–16.0)

## 2024-07-15 LAB — LACTIC ACID, PLASMA
Lactic Acid, Venous: 1.3 mmol/L (ref 0.5–1.9)
Lactic Acid, Venous: 1.4 mmol/L (ref 0.5–1.9)

## 2024-07-15 LAB — MAGNESIUM: Magnesium: 2 mg/dL (ref 1.7–2.4)

## 2024-07-15 MED ORDER — FUROSEMIDE 10 MG/ML IJ SOLN
80.0000 mg | Freq: Once | INTRAMUSCULAR | Status: AC
  Administered 2024-07-15: 80 mg via INTRAVENOUS
  Filled 2024-07-15: qty 8

## 2024-07-15 MED ORDER — SODIUM CHLORIDE 0.9% FLUSH: 10.0000 mL | INTRAVENOUS | Status: DC | PRN

## 2024-07-15 MED ORDER — ENOXAPARIN SODIUM 30 MG/0.3ML IJ SOSY
30.0000 mg | PREFILLED_SYRINGE | INTRAMUSCULAR | Status: DC
  Administered 2024-07-15 – 2024-07-16 (×2): 30 mg via SUBCUTANEOUS
  Filled 2024-07-15 (×2): qty 0.3

## 2024-07-15 MED ORDER — SODIUM CHLORIDE 0.9% FLUSH
10.0000 mL | Freq: Two times a day (BID) | INTRAVENOUS | Status: DC
  Administered 2024-07-15 – 2024-07-17 (×6): 10 mL
  Administered 2024-07-18: 20 mL
  Administered 2024-07-18 – 2024-07-21 (×6): 10 mL

## 2024-07-15 MED ORDER — FUROSEMIDE 10 MG/ML IJ SOLN
80.0000 mg | Freq: Two times a day (BID) | INTRAMUSCULAR | Status: DC
Start: 2024-07-15 — End: 2024-07-18
  Administered 2024-07-15 – 2024-07-17 (×5): 80 mg via INTRAVENOUS
  Filled 2024-07-15 (×5): qty 8

## 2024-07-15 MED ORDER — LEVOTHYROXINE SODIUM 75 MCG PO TABS
75.0000 ug | ORAL_TABLET | Freq: Every day | ORAL | Status: DC
  Administered 2024-07-16 – 2024-07-21 (×6): 75 ug via ORAL
  Filled 2024-07-15 (×6): qty 1

## 2024-07-15 MED ORDER — CHLORHEXIDINE GLUCONATE CLOTH 2 % EX PADS
6.0000 | MEDICATED_PAD | Freq: Every day | CUTANEOUS | Status: DC
  Administered 2024-07-15 – 2024-07-21 (×7): 6 via TOPICAL

## 2024-07-15 NOTE — Progress Notes (Addendum)
 Progress Note  Patient Name: [name] Date of Encounter: 07/15/2024 [facility] Cardiologist: Alm Clay, MD   Interval Summary   Patient reported that he still has ongoing peripheral edema, though does not feel short of breath despite still having oxygen requirement.  Shared he was experiencing PND prior to admission, however has not experiencing it during his hospital stay. Denied orthopnea.  Denied chest pain, abdominal discomfort.   Did share had a remote history of OSA, does not use CPAP  Today his main concern is that he still has scrotal edema.  Leg edema is a bit improved and his breathing is improved.  He was questioning why he was on oxygen.  Vital Signs Vitals:   07/15/24 0200 07/15/24 0300 07/15/24 0348 07/15/24 0622  BP:   117/74   Pulse:   64 62  Resp:   20 (!) 25  Temp:   98 F (36.7 C)   TempSrc:   Oral   SpO2: 93% 92% 92% (!) 87%  Weight:    75.4 kg  Height:        Intake/Output Summary (Last 24 hours) at 07/15/2024 0708 Last data filed at 07/15/2024 0349 Gross per 24 hour  Intake 890 ml  Output 4030 ml  Net -3140 ml      07/15/2024    6:22 AM 07/13/2024    3:00 AM 07/12/2024    8:20 PM  Last 3 Weights  Weight (lbs) 166 lb 4.8 oz 182 lb 1.6 oz 183 lb 3.2 oz  Weight (kg) 75.433 kg 82.6 kg 83.1 kg     CARDIAC STUDIES Moderate to severely reduced LV function with a EF of 30 to 35%.  Disproportionate severe hypokinesis to akinesis of the mid apical left anterior and anteroseptal walls.  GR 2 DD.  No LV thrombus.  Elevated left atrial pressures.  Moderate reduced RV function with flattened IVS and systole consistent with RV pressure overload.  Mild biatrial enlargement.  Moderate MAC.  Severe aortic calcification with severe aortic stenosis (mean AVG 33 mmHg)  Telemetry/ECG  Sinus rhythm with 1st degree AV block, PVC ectopy noted avg HR 56 - Personally Reviewed  Physical Exam  GEN: No acute distress.   Neck: Pulsatile jugular veins with  JVP up to angle of the jaw while seated Cardiac: RRR, harsh systolic murmur 4/6, absent S2 Respiratory: Diminished at right base with dullness to percussion, but nonlabored. GI: Soft, nontender, non-distended  MS: pitting 1+ BLE edema, scrotal edema   Assessment & Plan  [name] is a 83 y.o. male with a history of hypertension, hyperlipidemia, type 2 diabetes mellitus, CKD stage IIIa, hypothyroidism, GERD, BPH, obstructive sleep apnea, and bipolar disorder who presented to the ED on 9/30 for shortness of breath [DOE, significant orthopnea & PND] and lower extremity edema/scrotal swelling for 2-3 months with 20 lb weight gain. BNP 4159. Imaging showed evidence of volume overload. He was started on IV diuresis. Echo this admission showed showed LVEF of 30-35% with global hypokinesis but disproportionately severe hypokinesis/akinesis in the mid to apical left anterior and anteroapical walls G2 DD, moderate reduced RV function with flattened interventricular septum, severe aortic stenosis, and moderate severe MAC with mild MR . Cardiology consulted.   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 AKI on CKD Has been receiving IV diuresis Net IO  Since Admission: -7,685 mL [07/15/24 0711] however On exam still appears volume up Continue rise of Cr, IV diuresis has been discontinued. Cr 1.3 -> 1.69 -> 1.91 This am SpO2 87%. CXR from yesterday showed small right pleural effusion and stable interstitial lung markings with mild basilar atelectasis vs scarring. Patient does have chronic congestion, though no change from baseline. Denied fever, headache, chills, and cough.  For now we are holding spironolactone, Jardiance, losartan and IV Lasix to allow for renal function to stabilize.   Obviously on a beta-blocker  since he is still decompensated heart failure.  Anticipate that with low cardiac output-EF of 30 to 35% and severe AS, that he may require inotropic support likely dobutamine versus milrinone to allow for continued diuresis. I have consulted the Advanced Heart Failure Team to help facilitate further diuresis.  Per their request will order PICC and co-ox.      Hypertension BP: 117/74 GDMT as above => currently has ARB, MRA and diuretic on hold for worsening renal function.   Hyperlipidemia LDL  77  HDL 49 Continue 80 mg, LDL goal < 70 Will need repeat lipid panel and LFTs in 6-8 weeks   Per primary T2DM Hypothyroidism GERD  OSA BPH Bipolar disorder    For questions or updates, please contact [facility] Please consult [email or web address] for contact info under       Signed, Leontine LOISE Salen, PA-C    ATTENDING ATTESTATION  I have seen, examined and evaluated the patient this AM on rounds along with Leontine Salen, PA.  After reviewing all the available data and chart, we discussed the patients laboratory, study & physical findings as well as symptoms in detail.  I agree with her findings, examination as well as impression recommendations as per our discussion.    Attending adjustments noted in italics.   Unfortunately he remains volume overloaded but his creatinine is climbing as is his BUN.  I suspect that he STEMI now tropic support and we have therefore consulted advanced heart failure team.  Appreciate their assistance.  PICC line has been ordered. With advanced heart failure team on board, we can develop a stable plan for over the weekend management and then eventually get him plugged into the valve team for TAVR evaluation.  See detailed plan above per my direction.    Alm MICAEL Clay, MD, MS Alm Clay, M.D., M.S. Interventional Cardiologist  [facility] Pager # [phone]

## 2024-07-15 NOTE — Progress Notes (Signed)
 Mobility Specialist Progress Note;   07/15/24 1005  Mobility  Activity Ambulated with assistance (to bathroom)  Level of Assistance Contact guard assist, steadying assist  Assistive Device Four wheel walker  Distance Ambulated (ft) 10 ft  Activity Response Tolerated well  Mobility Referral Yes  Mobility visit 1 Mobility  Mobility Specialist Start Time (ACUTE ONLY) 1005  Mobility Specialist Stop Time (ACUTE ONLY) 1010  Mobility Specialist Time Calculation (min) (ACUTE ONLY) 5 min   Pt requesting assistance to BR. Required MinG to safely ambulate to BR, BM successful. VSS on 2 LO2. Reminded pt to pull call bell in BR once finished. Will transfer to bed once finished per RN request.   Lauraine Erm Mobility Specialist Please contact via SecureChat or Rehab Office [phone]

## 2024-07-15 NOTE — Progress Notes (Signed)
 Physical Therapy Treatment Patient Details Name: [name] MRN: [id number] DOB: [birth year] Today's Date: 07/15/2024   History of Present Illness Pt is a 83 y.o male admitted 9/30 for acute CHF. Chest x-ray showed vascular exacerbation. PMH: HTN, HLD, DM, hypothyroidism, CAD, CKD, bipolar dx, OSA    PT Comments  Pt received in supine, sleeping but awoken with verbal cues, pt then alert and following 1 and 2-step commands, pt flat affect and c/o poor sleep previous evening. Pt perseverating on LE dryness and assisted to don lotion, then pt agreeable to seated exercises and gait trial. Pt with bowel urgency once standing and needing >10 mins on toilet, audible gas sounds, RN/secretary notified pt will need assist once bathroom cord is pulled as he is high fall risk. SpO2 remains hypoxic on RA, pt needing 2L O2 [place] to maintain SpO2 equal to and >90% this date. Pt continues to benefit from PT services to progress toward functional mobility goals.     If plan is discharge home, recommend the following: A little help with walking and/or transfers;A little help with bathing/dressing/bathroom;Assistance with cooking/housework;Assist for transportation;Help with stairs or ramp for entrance   Can travel by private vehicle        Equipment Recommendations  None recommended by PT    Recommendations for Other Services       Precautions / Restrictions Precautions Precautions: Fall Recall of Precautions/Restrictions: Intact Precaution/Restrictions Comments: not on O2 baseline Restrictions Weight Bearing Restrictions Per Provider Order: No     Mobility  Bed Mobility Overal bed mobility: Modified Independent             General bed mobility comments: HOB elevated, use of R bed rail, increased time to perform.    Transfers Overall transfer level: Needs assistance Equipment used: Rollator (4 wheels) Transfers: Sit to/from Stand Sit to Stand: Contact guard assist           General  transfer comment: from EOB>Rollator and rollator>toilet, needs assist and cues to keep closer to rollator.    Ambulation/Gait Ambulation/Gait assistance: Contact guard assist, Supervision Gait Distance (Feet): 30 Feet Assistive device: Rollator (4 wheels) Gait Pattern/deviations: Step-through pattern, Decreased stride length, Trunk flexed, Knee flexed in stance - right, Knee flexed in stance - left Gait velocity: WFL Gait velocity interpretation: <1.8 ft/sec, indicate of risk for recurrent falls   General Gait Details: Pt slow pace in room due to obstacles, very crouched posture, downward gaze; Educated on safe AD use with rollator, proximity, posture, and symptom awareness (dyspneic) with SpO2 90-92% on 2L supplemental O2. Pt needs CGA intermittently and cues to turn in correct direction due to multiple lines (CVP, O2, IV, etc. Distance limited due to pt with bowel urgency, RN notified pt up on toilet.   Stairs             Wheelchair Mobility     Tilt Bed    Modified Rankin (Stroke Patients Only)       Balance Overall balance assessment: Needs assistance Sitting-balance support: Feet supported, No upper extremity supported Sitting balance-Leahy Scale: Fair     Standing balance support: No upper extremity supported, During functional activity, Bilateral upper extremity supported Standing balance-Leahy Scale: Poor Standing balance comment: poor unsupported, fair wtih rollator                            Communication Communication Communication: Impaired Factors Affecting Communication: Hearing impaired  Cognition Arousal: Alert Behavior During Therapy:  WFL for tasks assessed/performed, Flat affect   PT - Cognitive impairments: No apparent impairments, Safety/Judgement                       PT - Cognition Comments: Pt expressed frustration from lack of sleep overnight and generalized discomfort. Needs cues for line awareness/safety with O2/IV  lines, pt does not use O2 at baseline. Following commands: Intact      Cueing Cueing Techniques: Verbal cues, Gestural cues  Exercises General Exercises - Lower Extremity Ankle Circles/Pumps: AROM, Both, 5 reps, Seated Long Arc Quad: AAROM, Both, 10 reps, Seated Hip Flexion/Marching: AROM, Right, Left, 5 reps, Seated Other Exercises Other Exercises: IS x 15 reps with cues for technique, pt achieves ~52mL at most; encouraged hourly use x5-10 reps in bed/chair Other Exercises: chair push-ups x3 reps for UE strengthening    General Comments General comments (skin integrity, edema, etc.): SpO2 desat to 85% on RA at rest (pt eating a banana, cues for deep breaths but continues to drop), 90% and above on 2L O2 [place]      Pertinent Vitals/Pain Pain Assessment Pain Assessment: Faces Faces Pain Scale: Hurts little more Pain Location: Scrotum and L medial medial malleolus redness/discomfort, pt reports it is from an injury in his childhood but PTA unsure if redness there is new Pain Descriptors / Indicators: Discomfort, Grimacing, Guarding Pain Intervention(s): Limited activity within patient's tolerance, Monitored during session, Repositioned    Home Living                          Prior Function            PT Goals (current goals can now be found in the care plan section) Acute Rehab PT Goals Patient Stated Goal: get well, return home PT Goal Formulation: With patient Time For Goal Achievement: 07/28/24 Progress towards PT goals: Progressing toward goals    Frequency    Min 2X/week      PT Plan      Co-evaluation              AM-PAC PT 6 Clicks Mobility   Outcome Measure  Help needed turning from your back to your side while in a flat bed without using bedrails?: None Help needed moving from lying on your back to sitting on the side of a flat bed without using bedrails?: None Help needed moving to and from a bed to a chair (including a wheelchair)?: A  Little Help needed standing up from a chair using your arms (e.g., wheelchair or bedside chair)?: A Little Help needed to walk in hospital room?: A Little Help needed climbing 3-5 steps with a railing? : A Lot 6 Click Score: 19    End of Session Equipment Utilized During Treatment: Gait belt;Oxygen Activity Tolerance: Patient tolerated treatment well Patient left: with call bell/phone within reach;Other (comment) (on toilet, RN and Licensed conveyancer notified as pt is high fall risk; pull cord placed closer to him and pt A&O and agreeable to pull prior to getting up from toilet) Nurse Communication: Mobility status;Precautions;Other (comment) (on toilet; L medial ankle and BLE distal redness; pt asking about cause of dry skin) PT Visit Diagnosis: Other abnormalities of gait and mobility (R26.89);Unsteadiness on feet (R26.81);Muscle weakness (generalized) (M62.81);Difficulty in walking, not elsewhere classified (R26.2)     Time: 8552-8486 PT Time Calculation (min) (ACUTE ONLY): 26 min  Charges:    $Gait Training: 8-22 mins $Therapeutic Exercise:  8-22 mins PT General Charges $$ ACUTE PT VISIT: 1 Visit                     Christoph Copelan P., PTA Acute Rehabilitation Services Secure Chat Preferred 9a-5:30pm Office: [phone]    Connell HERO [facility] 07/15/2024, 3:43 PM

## 2024-07-15 NOTE — Progress Notes (Addendum)
 Progress Note   Patient: [name] FMW:[id number] DOB: 09/21/1941 DOA: 07/12/2024     2 DOS: the patient was seen and examined on 07/15/2024   Brief hospital course: [name] was admitted to the hospital with the working diagnosis of heart failure.   83 yo male with the past medical history of hypertension, hyperlipidemia, T2DM, coronary artery disease, CKD stage 3a, BPH, biplar and OSA who presented with dyspnea.  Reported one moth of worsening lower extremity edema, and dyspnea on exertion. Positive 15 lbs weight gain and expanding edema to his scrotum. Symptoms refractive to loop diuretic at home.  At the time of his initial physical examination his blood pressure was 127/76, HR 60, RR 18 and 02 saturation 95% Lungs with bilateral rales with no wheezing, heart with S1 and S2 present and regular with no gallops, rubs or murmurs, abdomen with no distention, positive lower extremity edema.   Na 137, K 4,4 CL 104 bicarbonate 24 glucose 98 bun 35 cr 1,42  BNP 4,159  Wbc 7.4 hgb 10.6 plt 224   Sars covid 19 negative  Influenza negative RSV negative   Chest radiograph with hypoinflation, positive cardiomegaly, bilateral hilar vascular congestion, fluid in the right fissure, bilateral pleural effusions.   EKG 60 bpm, normal axis, qtc 476, right bundle branch block, sinus rhythm with 1st degree AV block with no significant ST segment or T wave changes.   Patient was placed on furosemide for diuresis.  Echocardiogram with reduced LV systolic function and severe aortic stenosis.  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 Hypokinetic antero lateral wall, anterior septum, inferior wall, posterior wall, mid infero septal segment, basal anterior segment, and basal  inferoseptal segment.  Grade II diastolic dysfunction, (pseudo normalization EA), interventricular septum is flattened in systole, RV systolic function with moderate reduction, mild RV enlargement, LA and RA with mild dilatation, mild mitral valve regurgitation, severe aortic valve stenosis.   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   Urine output documented 4,030 ml Systolic blood pressure 100 mmHg range.   Hold on furosemide for now due to elevated serum cr  Continue with spironolactone and SGLT 2 inh.  Possible addition of further afterload reduction with ARB or Ace inh when renal function more stable.  Plan for PICC line check SVO2, possible low output state.   Acute hypoxemic respiratory failure.  Today continue to need supplemental 02 per [place]. Follow up chest radiograph 10/02 continue with signs of pulmonary edema.  02 saturation today 92% on room air.   Essential hypertension Continue blood pressure monitoring  Stage 3a chronic kidney disease (HCC) AKI Improved volume status but not yet back to baseline  Today renal function with worsening serum cr at 1.91 with K at 4,8 and serum bicarbonate at 30  Na 137 and Mg 2.0   Avoid hypotension and nephrotoxic medications  Type 2 diabetes mellitus with hyperlipidemia (HCC) Glucose has been controlled.  Hold on insulin therapy.   Continue with statin therapy  Acquired hypothyroidism Continue levothyroxine.   BPH (benign prostatic hyperplasia) No signs of urinary retention   GERD Continue pantoprazole.   Bipolar disorder (HCC) No current medications, follow up as outpatient   Sleep apnea C pap        Subjective: patient with mild somnolence today,  dyspnea has improved, continue to have dependent edema.   Physical Exam: Vitals:   07/15/24 0300 07/15/24 0348 07/15/24 0622 07/15/24 0910  BP:  117/74  101/78  Pulse:  64 62 61  Resp:  20 (!) 25 17  Temp:  98 F (36.7 C)  98.2 F (36.8 C)   TempSrc:  Oral  Oral  SpO2: 92% 92% (!) 87% 92%  Weight:   75.4 kg   Height:       Neurology mild somnolent but easy to arouse ENT with mild pallor Cardiovascular with S1 and S2 (decreased), present, positive crescendo decrescendo murmur at the base, radiated to the carotids, no gallops or rubs Respiratory with mild rales with no wheezing Abdomen with no distention Lower extremity trace, positive thigh edema  Data Reviewed:    Family Communication: no family at the bedside   Disposition: Status is: Inpatient Remains inpatient appropriate because: heart failure   Planned Discharge Destination: Home     Author: Elidia Toribio Furnace, MD 07/15/2024 10:01 AM  For on call review [email or web address].

## 2024-07-15 NOTE — Progress Notes (Signed)
 Heart Failure Navigator Progress Note  Assessed for Heart & Vascular TOC clinic readiness.  Patient does not meet criteria due to Advanced Heart Failure Team was consulted 07/15/2024. .   Navigator will sign off at this time.   Stephane Haddock, BSN, Scientist, clinical (histocompatibility and immunogenetics) Only

## 2024-07-15 NOTE — Progress Notes (Signed)
 Peripherally Inserted Central Catheter Placement  The IV Nurse has discussed with the patient and/or persons authorized to consent for the patient, the purpose of this procedure and the potential benefits and risks involved with this procedure.  The benefits include less needle sticks, lab draws from the catheter, and the patient may be discharged home with the catheter. Risks include, but not limited to, infection, bleeding, blood clot (thrombus formation), and puncture of an artery; nerve damage and irregular heartbeat and possibility to perform a PICC exchange if needed/ordered by physician.  Alternatives to this procedure were also discussed.  Bard Power PICC patient education guide, fact sheet on infection prevention and patient information card has been provided to patient /or left at bedside.    PICC Placement Documentation  PICC Double Lumen 07/15/24 Right Brachial 37 cm 0 cm (Active)  Indication for Insertion or Continuance of Line Chronic illness with exacerbations (CF, Sickle Cell, etc.) 07/15/24 1116  Exposed Catheter (cm) 0 cm 07/15/24 1116  Site Assessment Clean, Dry, Intact 07/15/24 1116  Lumen #1 Status Flushed;Saline locked;Blood return noted 07/15/24 1116  Lumen #2 Status Flushed;Saline locked;Blood return noted 07/15/24 1116  Dressing Type Transparent;Securing device 07/15/24 1116  Dressing Status Antimicrobial disc/dressing in place;Clean, Dry, Intact 07/15/24 1116  Line Care Connections checked and tightened 07/15/24 1116  Line Adjustment (NICU/IV Team Only) No 07/15/24 1116  Dressing Intervention New dressing;Adhesive placed at insertion site (IV team only) 07/15/24 1116  Dressing Change Due 07/22/24 07/15/24 1116       [name] 07/15/2024, 11:29 AM

## 2024-07-15 NOTE — Progress Notes (Signed)
 SATURATION QUALIFICATIONS: (This note is used to comply with regulatory documentation for home oxygen)  Patient Saturations on Room Air at Rest = 85%  Patient Saturations on Room Air while Ambulating = N/A, pt hypoxic on RA at rest  Patient Saturations on 2 Liters of oxygen while Ambulating = 90%  Please briefly explain why patient needs home oxygen: Pt hypoxic on RA at rest, will need at least 2L O2 [place] for exertional tasks at this time.

## 2024-07-15 NOTE — Consult Note (Addendum)
 Advanced Heart Failure Team Consult Note   Primary Physician: Nichole Senior, MD Cardiologist:  Alm Clay, MD  Reason for Consultation: acute biventicular systolic heart failure w/ suspected low ouptut   HPI:    [name] is seen today for evaluation of acute biventricular systolic heart failure w/ suspected low output at the request of Dr. Clay, Cardiology.   83 y/o male w/ HTN, HLD, Type 2 DM, CKD IIIa, hypothyroidism, OSA and bioplar disorder but no prior cardiac history, who presented w/ new HF symptoms and marked volume overload. Echo demonstrated biventricular failure, LVEF 30-35%, RV mod reduced, global hypokinesis w/ disproportionately severe hypokinesis/akinesis in the mid-apical left anterior and anteroseptal walls and severe AS, AVA 0.72 cm, mean gradient 33 mmHg. Interventricular septum flattened in systole, consistent with right ventricular pressure overload. Cardiology consulted. Being considered for TAVR. Now w/ rising SCr w/ attempts and diuresis and low BP limiting GDMT. Concern for low output HF. AHF team consulted.   Scr 1.32>>1.69>>1.91   LA 1.3    BPs 90s-low 100s systolic   On exam, he is markedly volume overloaded w/ JVD to ear, abdominal and scrotal edema. Denies resting dyspnea but requiring 3 L [place]. Reports NYHA Class III symptoms. Denies CP.   Yesterday, he had 4L in UOP w/ 60 mg IV x 2 doses but, as noted above, SCr continues to rise.    Echo 07/13/24 1. No left ventricular thrombus is seen (Definity contrast was used).  Although there is global hypokinesis, there is disproportionately severe  hypokinesis/akinesis in the mid-apical left anterior and anteroseptal  walls. Left ventricular ejection  fraction, by estimation, is 30 to 35%. The left ventricle has moderately  decreased function. The left ventricle demonstrates regional wall motion  abnormalities (see scoring diagram/findings for description). Left  ventricular diastolic parameters are   consistent with Grade II diastolic dysfunction (pseudonormalization).  Elevated left atrial pressure. There is the interventricular septum is  flattened in systole, consistent with right ventricular pressure overload.   2. Right ventricular systolic function is moderately reduced. The right  ventricular size is mildly enlarged. Tricuspid regurgitation signal is  inadequate for assessing PA pressure.   3. Left atrial size was mildly dilated.   4. Right atrial size was mildly dilated.   5. The mitral valve is degenerative. Mild mitral valve regurgitation. No  evidence of mitral stenosis. The mean mitral valve gradient is 3.0 mmHg  with average heart rate of 66 bpm. Moderate to severe mitral annular  calcification.   6. The aortic valve is tricuspid. There is severe calcifcation of the  aortic valve. There is severe thickening of the aortic valve. Aortic valve  regurgitation is trivial. Severe aortic valve stenosis.  Aortic valve mean gradient measures  33.0 mmHg. Aortic valve peak gradient measures 61.8 mmHg. Aortic valve  area, by VTI measures 0.77 cm   7. The inferior vena cava is dilated in size with <50% respiratory  variability, suggesting right atrial pressure of 15 mmHg.    Home Medications Prior to Admission medications   Medication Sig Start Date End Date Taking? Authorizing Provider  amLODipine  (NORVASC ) 2.5 MG tablet Take 2.5 mg by mouth daily.   Yes [provider]  carboxymethylcellulose (REFRESH PLUS) 0.5 % SOLN Place 1 drop into both eyes daily as needed (dry eyes).   Yes [provider]  docusate sodium  (COLACE) 100 MG capsule Take 200 mg by mouth 2 (two) times daily as needed for mild constipation.   Yes [provider]  fluticasone (FLONASE) 50 MCG/ACT nasal spray Place 2 sprays into both nostrils daily as needed for allergies. 06/24/18  Yes [provider]  furosemide (LASIX) 40 MG tablet Take 40 mg by mouth daily.   Yes [provider]  levothyroxine (SYNTHROID) 75 MCG tablet Take 75 mcg by mouth daily before breakfast.   Yes [provider]  losartan (COZAAR) 50 MG tablet Take 50 mg by mouth daily.   Yes [provider]  polyethylene glycol (MIRALAX  / GLYCOLAX ) 17 g packet Take 17 g by mouth 2 (two) times daily as needed for mild constipation.   Yes [provider]  amLODipine  (NORVASC ) 5 MG tablet Take 1 tablet (5 mg total) by mouth daily. 02/11/22 02/11/23  Dennise Lavada POUR, MD    Past Medical History: Past Medical History:  Diagnosis Date   Hypertension     Past Surgical History: Past Surgical History:  Procedure Laterality Date   APPENDECTOMY      Family History: Family History  Problem Relation Age of Onset   CAD Father    Diabetes Mellitus II Maternal Grandmother     Social History: Social History   Socioeconomic History   Marital status: Married    Spouse name: Not on file   Number of children: Not on file   Years of education: Not on file   Highest education level: Not on file  Occupational History   Not on file  Tobacco Use   Smoking status: Former    Types: Cigarettes   Smokeless tobacco: Never  Substance and Sexual Activity   Alcohol use: Yes   Drug use: Never   Sexual activity: Not on file  Other Topics Concern   Not on file  Social History Narrative   Not on file   Social Drivers of Health   Financial Resource Strain: Not on file  Food Insecurity: Patient Declined (07/12/2024)   Hunger Vital Sign    Worried About Running Out of Food in the Last Year: Patient declined    Ran Out of Food in the Last Year: Patient declined  Transportation Needs: Patient Declined (07/12/2024)   PRAPARE - Administrator, Civil Service (Medical): Patient declined    Lack of Transportation (Non-Medical): Patient declined  Physical Activity: Not on file  Stress: Not on file  Social Connections: Patient Declined (07/12/2024)   Social Connection and  Isolation Panel    Frequency of Communication with Friends and Family: Patient declined    Frequency of Social Gatherings with Friends and Family: Patient declined    Attends Religious Services: Patient declined    Database administrator or Organizations: Patient declined    Attends Engineer, structural: Patient declined    Marital Status: Patient declined    Allergies:  No Known Allergies  Objective:    Vital Signs:   Temp:  [97.6 F (36.4 C)-98.2 F (36.8 C)] 98.2 F (36.8 C) (10/03 0910) Pulse Rate:  [60-76] 61 (10/03 0910) Resp:  [15-25] 17 (10/03 0910) BP: (92-117)/(56-91) 101/78 (10/03 0910) SpO2:  [87 %-95 %] 92 % (10/03 0910) Weight:  [75.4 kg] 75.4 kg (10/03 0622) Last BM Date : 07/12/24  Weight change: Filed Weights   07/12/24 2020 07/13/24 0300 07/15/24 0622  Weight: 83.1 kg 82.6 kg 75.4 kg    Intake/Output:   Intake/Output Summary (Last 24 hours) at 07/15/2024 0948 Last data filed at 07/15/2024 0715 Gross per 24 hour  Intake 890 ml  Output 4330  ml  Net -3440 ml      Physical Exam    General:  elderly male, sitting up in bed. No resting dyspnea  Neck: JVD elevated to ear  Cor: RRR, 3/6 SEM heard through the precordium  Lungs: diminished at the bases bilaterally  Abdomen: mildly distended/ +abdominal wall edema  Extremities: 1+ b/l LEE  Neuro: alert & orientedx3, cranial nerves grossly intact. moves all 4 extremities w/o difficulty. Affect pleasant GU: + scrotal edema   Telemetry   SB w/ 1st deg AVB, upper 50s, personally reviewed   EKG    NSR 60 bpm w/ 1st deg AVG and RBBB, personally reviewed   Labs   Basic Metabolic Panel: Recent Labs  Lab 07/12/24 1514 07/12/24 1942 07/13/24 0341 07/14/24 0327 07/15/24 0331  NA 137  --  136 136 137  K 4.4  --  4.4 4.4 4.8  CL 104  --  102 100 94*  CO2 24  --  25 25 30   GLUCOSE 98  --  90 126* 158*  BUN 35*  --  34* 40* 45*  CREATININE 1.42*  --  1.32* 1.69* 1.91*  CALCIUM 8.8*  --   8.7* 9.0 9.1  MG  --  1.8  --  1.7 2.0    Liver Function Tests: Recent Labs  Lab 07/13/24 0341  AST 24  ALT 29  ALKPHOS 83  BILITOT 0.8  PROT 5.7*  ALBUMIN 2.9*   No results for input(s): LIPASE, AMYLASE in the last 168 hours. No results for input(s): AMMONIA in the last 168 hours.  CBC: Recent Labs  Lab 07/12/24 1514 07/13/24 0341  WBC 7.4 5.5  NEUTROABS 6.0  --   HGB 10.6* 10.6*  HCT 34.0* 32.7*  MCV 92.6 89.8  PLT 224 211    Cardiac Enzymes: No results for input(s): CKTOTAL, CKMB, CKMBINDEX, TROPONINI in the last 168 hours.  BNP: BNP (last 3 results) Recent Labs    07/12/24 1530  BNP 4,159.6*    ProBNP (last 3 results) No results for input(s): PROBNP in the last 8760 hours.   CBG: No results for input(s): GLUCAP in the last 168 hours.  Coagulation Studies: No results for input(s): LABPROT, INR in the last 72 hours.   Imaging   US  EKG SITE RITE Result Date: 07/15/2024 If Site Rite image not attached, placement could not be confirmed due to current cardiac rhythm.  DG Chest 1 View Result Date: 07/14/2024 EXAM: 1 VIEW(S) XRAY OF THE CHEST 07/14/2024 11:47:42 AM COMPARISON: 07/12/2024 CLINICAL HISTORY: Follow-up exam [id number]. Acute on chronic systolic CHF (congestive heart failure) (HCC). FINDINGS: LUNGS AND PLEURA: Stable moderate severity diffusely increased interstitial lung markings are seen with mild areas of atelectasis and scarring noted within the bilateral lung bases. There is a small stable right pleural effusion versus pleural thickening. No pneumothorax is identified. HEART AND MEDIASTINUM: The cardiac silhouette is mildly enlarged and unchanged in size. BONES AND SOFT TISSUES: No acute osseous abnormality. IMPRESSION: 1. Stable moderate interstitial lung markings with mild basilar atelectasis/scarring, unchanged. 2. Small right pleural effusion versus pleural thickening, stable. 3. Mild cardiomegaly, unchanged. Electronically  signed by: [facility] MD 07/14/2024 02:21 PM EDT RP Workstation: [id number]     Medications:     Current Medications:  atorvastatin  80 mg Oral Daily   enoxaparin  (LOVENOX ) injection  30 mg Subcutaneous Q24H   [START ON 07/16/2024] levothyroxine  75 mcg Oral Q0600   sodium chloride  flush  3 mL Intravenous Q12H  Infusions:     Patient Profile   83 y/o male w/ HTN, HLD, Type 2 DM, CKD IIIa, hypothyroidism, OSA and bioplar disorder but no prior cardiac history, who presented w/ new HF symptoms and marked volume overload. Echo w/ biventricular failure and severe AS. AHF team consulted for low output HF.   Assessment/Plan   1. 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 Interventricular septum flattened in systole, consistent with right ventricular pressure overload, moderate RV dysfunction with mild RV enlargement. No prior study for comparison  - etiology likely 2/2 valvular heart disease +/- possible ischemic CM. No h/o CP. Troponins not cycled on admission - SCr rising w/ attempts at diuresis concerning for potential low output. BP also soft, limiting GDMT. LA checked this morning and was ok at 1.3 but still suspect low output state and will likely need inotropic support to augment diuresis  - place PICC  and check co-ox. Set up CVP monitoring  - if Co-ox low, will use DBA. Avoid milrinone w/ soft BP and severe AS   - he had good response to 60 mg IV Lasix yesterday. Will increase to 80 mg bid  - no ARB/ARNi, MRA nor dig yet w/ elevated sCr - no ? blocker w/ suspected low output - no hydral/nitrates in setting of severe AS and soft BP  - will need TAVR w/u post diuresis. Plan RHC after  diuresis and LHC if scr improves/ permits   2. Aortic Stenosis - severe by TTE, AVA by VTI measured at 72 cm, mean gradient 33 mmHg - given age, TAVR likely best suitable option. Will need structural heart team consultation  - note, also w/ baseline RBBB   3. AKI on CKD IIIa - Scr 1.3 on admit, >>1.69>>1.91 today  - suspect cardiorenal  - plan diuresis + inotropic support per above  - follow BMP    Length of Stay: 2  Caffie Shed, PA-C  07/15/2024, 9:48 AM    Advanced Heart Failure Team Pager [phone] (M-F; 7a - 5p)  Please contact [facility] for night-coverage after hours (4p -7a ) and weekends on [email or web address]  Patient seen with PA, I formulated the plan and agree with the above note.   History as noted above.  Patient was admitted with CHF, found to have severe AS with biventricular heart failure.  He does have prominent RV dysfunction. Diuresis has been attempted, but creatinine has risen to 1.91 (though patient appears to have had reasonable urine output).  Lactate normal today.   General: NAD Neck: JVP 14 cm, no thyromegaly or thyroid  nodule.  Lungs: Clear to auscultation bilaterally with normal respiratory effort. CV: Nondisplaced PMI.  Heart regular S1/S2, no S3/S4, 3/6 SEM RUSB with obscured S2.  1+ edema to knees.  No carotid bruit.  Difficult to palpate pedal pulses.  Abdomen: Soft, nontender, no hepatosplenomegaly, no distention.  Skin: Intact without lesions or rashes.  Neurologic: Alert and oriented x 3.  Psych: Normal affect. Extremities: No clubbing or cyanosis.  HEENT: Normal.   Acute systolic CHF with prominent RV failure and also severe aortic stenosis.  Patient is significantly volume overloaded on exam.  - PICC placed, follow CVP and co-ox.  - If co-ox low, would start dobutamine gtt to facilitate  diuresis.  - Agree with Lasix 80 mg IV bid.  - Other GDMT limited at this time by AKI and relatively low BP.  - Needs RHC/LHC when creatinine is  stabilized.   We are aiming for TAVR, will need to diurese first and stabilize creatinine to allow TAVR workup.  Initiation of dobutamine may help with this.  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   [name] 07/15/2024 1:09 PM

## 2024-07-16 DIAGNOSIS — I5023 Acute on chronic systolic (congestive) heart failure: Secondary | ICD-10-CM | POA: Diagnosis not present

## 2024-07-16 DIAGNOSIS — I10 Essential (primary) hypertension: Secondary | ICD-10-CM | POA: Diagnosis not present

## 2024-07-16 DIAGNOSIS — E1169 Type 2 diabetes mellitus with other specified complication: Secondary | ICD-10-CM | POA: Diagnosis not present

## 2024-07-16 DIAGNOSIS — N1831 Chronic kidney disease, stage 3a: Secondary | ICD-10-CM | POA: Diagnosis not present

## 2024-07-16 LAB — GLUCOSE, CAPILLARY
Glucose-Capillary: 92 mg/dL (ref 70–99)
Glucose-Capillary: 93 mg/dL (ref 70–99)
Glucose-Capillary: 138 mg/dL — ABNORMAL HIGH (ref 70–99)

## 2024-07-16 LAB — BASIC METABOLIC PANEL WITH GFR
Anion gap: 9 (ref 5–15)
BUN: 45 mg/dL — ABNORMAL HIGH (ref 8–23)
CO2: 32 mmol/L (ref 22–32)
Calcium: 9 mg/dL (ref 8.9–10.3)
Chloride: 94 mmol/L — ABNORMAL LOW (ref 98–111)
Creatinine, Ser: 1.63 mg/dL — ABNORMAL HIGH (ref 0.61–1.24)
GFR, Estimated: 41 mL/min — ABNORMAL LOW (ref >60)
Glucose, Bld: 121 mg/dL — ABNORMAL HIGH (ref 70–99)
Potassium: 4.8 mmol/L (ref 3.5–5.1)
Sodium: 135 mmol/L (ref 135–145)

## 2024-07-16 LAB — HEMOGLOBIN A1C
Hgb A1c MFr Bld: 6.2 % — ABNORMAL HIGH (ref 4.8–5.6)
Mean Plasma Glucose: 131.24 mg/dL

## 2024-07-16 LAB — COOXEMETRY PANEL
Carboxyhemoglobin: 1 % (ref 0.5–1.5)
Methemoglobin: <0.7 % (ref 0.0–1.5)
O2 Saturation: 66.5 %
Total hemoglobin: 11.5 g/dL — ABNORMAL LOW (ref 12.0–16.0)

## 2024-07-16 LAB — MAGNESIUM: Magnesium: 1.9 mg/dL (ref 1.7–2.4)

## 2024-07-16 MED ORDER — INSULIN ASPART 100 UNIT/ML IJ SOLN
0.0000 [IU] | Freq: Three times a day (TID) | INTRAMUSCULAR | Status: DC
  Administered 2024-07-17 – 2024-07-21 (×3): 1 [IU] via SUBCUTANEOUS

## 2024-07-16 NOTE — Progress Notes (Signed)
   Rounding Note    Patient Name: [name] Date of Encounter: 07/16/2024  [facility] Cardiologist: Alm Clay, MD   Subjective   No acute events overnight.  Reports steady improvement in his symptoms with diuresis.  Vital Signs    Vitals:   07/16/24 0015 07/16/24 0424 07/16/24 0427 07/16/24 0747  BP: 109/69 111/72  100/73  Pulse: (!) 52 (!) 58  60  Resp: 15 20  17   Temp: (!) 96.8 F (36 C) 98 F (36.7 C)  98.4 F (36.9 C)  TempSrc: Axillary Oral  Oral  SpO2: 100% 100%  100%  Weight:   73.6 kg   Height:        Intake/Output Summary (Last 24 hours) at 07/16/2024 0914 Last data filed at 07/16/2024 0300 Gross per 24 hour  Intake 240 ml  Output 3100 ml  Net -2860 ml      07/16/2024    4:27 AM 07/15/2024    6:22 AM 07/13/2024    3:00 AM  Last 3 Weights  Weight (lbs) 162 lb 4.1 oz 166 lb 4.8 oz 182 lb 1.6 oz  Weight (kg) 73.6 kg 75.433 kg 82.6 kg      Telemetry    Sinus- personally Reviewed  ECG    Personally Reviewed  Physical Exam   GEN: No acute distress.   Cardiac: RRR, no murmurs, rubs, or gallops.  1+ pitting bilateral lower extremity edema.   Respiratory: Clear to auscultation bilaterally. Psych: Normal affect   Assessment & Plan    #Acute on chronic systolic heart failure #Severe aortic stenosis EF 30 to 35%.  NYHA class III.  Still volume overloaded.  Creatinine improving with diuresis.    Continue Lasix 80 mg IV twice daily  TAVR workup ongoing   Ole T. Cindie, MD, [facility], [facility] Cardiac Electrophysiology

## 2024-07-16 NOTE — Plan of Care (Signed)

## 2024-07-16 NOTE — Progress Notes (Signed)
   07/16/24 1130  Mobility  Activity Pivoted/transferred from bed to chair  Level of Assistance Contact guard assist, steadying assist  Assistive Device Front wheel walker  Activity Response Tolerated fair  Mobility Referral Yes  Mobility visit 1 Mobility  Mobility Specialist Start Time (ACUTE ONLY) 1130  Mobility Specialist Stop Time (ACUTE ONLY) 1145  Mobility Specialist Time Calculation (min) (ACUTE ONLY) 15 min   Mobility Specialist: Progress Note  Pre-Mobility:      HR 59, SpO2 96% 3L Post-Mobility:    HR  58, SpO2 97% 3L  Pt agreeable to mobility session - received in bed. C/o scrotum pain. Returned to chair with all needs met - call bell within reach. Chair alarm on.   Additional comments: Further mobility deferred by pt d/t just waking up.  Virgle Boards, BS Mobility Specialist Please contact via SecureChat or  Rehab office at [phone].

## 2024-07-16 NOTE — Progress Notes (Signed)
 Progress Note   Patient: [name] FMW:[id number] DOB: [birth year] DOA: 07/12/2024     3 DOS: the patient was seen and examined on 07/16/2024   Brief hospital course: [name] was admitted to the hospital with the working diagnosis of heart failure.   83 yo male with the past medical history of hypertension, hyperlipidemia, T2DM, coronary artery disease, CKD stage 3a, BPH, biplar and OSA who presented with dyspnea.  Reported one moth of worsening lower extremity edema, and dyspnea on exertion. Positive 15 lbs weight gain and expanding edema to his scrotum. Symptoms refractive to loop diuretic at home.  At the time of his initial physical examination his blood pressure was 127/76, HR 60, RR 18 and 02 saturation 95% Lungs with bilateral rales with no wheezing, heart with S1 and S2 present and regular with no gallops, rubs or murmurs, abdomen with no distention, positive lower extremity edema.   Na 137, K 4,4 CL 104 bicarbonate 24 glucose 98 bun 35 cr 1,42  BNP 4,159  Wbc 7.4 hgb 10.6 plt 224   Sars covid 19 negative  Influenza negative RSV negative   Chest radiograph with hypoinflation, positive cardiomegaly, bilateral hilar vascular congestion, fluid in the right fissure, bilateral pleural effusions.   EKG 60 bpm, normal axis, qtc 476, right bundle branch block, sinus rhythm with 1st degree AV block with no significant ST segment or T wave changes.   Patient was placed on furosemide for diuresis.  Echocardiogram with reduced LV systolic function and severe aortic stenosis.  10/03 worsening renal function, PICC Line placed SVO 69.3  10/04 continue diuresis.  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 Hypokinetic antero lateral wall, anterior septum, inferior wall, posterior wall, mid infero septal segment, basal  anterior segment, and basal inferoseptal segment.  Grade II diastolic dysfunction, (pseudo normalization EA), interventricular septum is flattened in systole, RV systolic function with moderate reduction, mild RV enlargement, LA and RA with mild dilatation, mild mitral valve regurgitation, severe aortic valve stenosis.   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   Urine output documented 3,431ml Systolic blood pressure 100 mmHg range.  SV02 66.5   Resumed furosemide 80 mg IV bid  Holding on spironolactone and SGLT 2 inh due to acutely reduced GFR.  Possible addition of further afterload reduction with ARB or Ace inh when renal function more stable.   Acute hypoxemic respiratory failure.  Today continue to need supplemental 02 per [place]. Follow up chest radiograph 10/02 continue with signs of pulmonary edema.  02 saturation today 100 % on 3 L.min per [place]   Essential hypertension Continue blood pressure monitoring  Stage 3a chronic kidney disease (HCC) AKI  Renal function with serum cr at 1,63 with K at 4,8 and serum bicarbonate at 32 Na 135 and Mg 1,9   Continue diuresis and follow up renal function in am.   Type 2 diabetes mellitus with hyperlipidemia (HCC) Glucose has been controlled Resume insulin sliding scale.  Continue with statin therapy  Acquired hypothyroidism Continue levothyroxine.   BPH (benign prostatic hyperplasia) No signs of urinary retention   GERD Continue pantoprazole.   Bipolar disorder (HCC) No current medications, follow up as outpatient   Sleep apnea C pap        Subjective: patient is feeling better, no chest pain dyspnea and edema continue to improve   Physical  Exam: Vitals:   07/16/24 0015 07/16/24 0424 07/16/24 0427 07/16/24 0747  BP: 109/69 111/72  100/73  Pulse: (!) 52 (!) 58  60  Resp: 15 20  17   Temp: (!) 96.8 F (36 C) 98 F (36.7 C)  98.4 F (36.9 C)  TempSrc: Axillary Oral  Oral  SpO2: 100% 100%  100%   Weight:   73.6 kg   Height:       Neurology awake and alert ENT with mild pallor with no icterus Cardiovascular with S1 and diminished S2, positive systolic murmur at the apex, radiated to the carotids, crescendo decrescendo  No JVD Respiratory with mild rales at bases with no wheezing or rhonchi  Abdomen with no distention  Lower extremity edema +  Data Reviewed:    Family Communication: no family at the bedside   Disposition: Status is: Inpatient Remains inpatient appropriate because: diuresis   Planned Discharge Destination: Home     Author: Elidia Toribio Furnace, MD 07/16/2024 8:44 AM  For on call review [email or web address].

## 2024-07-17 DIAGNOSIS — N1831 Chronic kidney disease, stage 3a: Secondary | ICD-10-CM | POA: Diagnosis not present

## 2024-07-17 DIAGNOSIS — I10 Essential (primary) hypertension: Secondary | ICD-10-CM | POA: Diagnosis not present

## 2024-07-17 DIAGNOSIS — E1169 Type 2 diabetes mellitus with other specified complication: Secondary | ICD-10-CM | POA: Diagnosis not present

## 2024-07-17 DIAGNOSIS — I5023 Acute on chronic systolic (congestive) heart failure: Secondary | ICD-10-CM | POA: Diagnosis not present

## 2024-07-17 LAB — BASIC METABOLIC PANEL WITH GFR
Anion gap: 11 (ref 5–15)
BUN: 41 mg/dL — ABNORMAL HIGH (ref 8–23)
CO2: 36 mmol/L — ABNORMAL HIGH (ref 22–32)
Calcium: 9 mg/dL (ref 8.9–10.3)
Chloride: 88 mmol/L — ABNORMAL LOW (ref 98–111)
Creatinine, Ser: 1.43 mg/dL — ABNORMAL HIGH (ref 0.61–1.24)
GFR, Estimated: 48 mL/min — ABNORMAL LOW (ref >60)
Glucose, Bld: 105 mg/dL — ABNORMAL HIGH (ref 70–99)
Potassium: 4.1 mmol/L (ref 3.5–5.1)
Sodium: 135 mmol/L (ref 135–145)

## 2024-07-17 LAB — COOXEMETRY PANEL
Carboxyhemoglobin: 1.1 % (ref 0.5–1.5)
Methemoglobin: <0.7 % (ref 0.0–1.5)
O2 Saturation: 68.3 %
Total hemoglobin: 11.6 g/dL — ABNORMAL LOW (ref 12.0–16.0)

## 2024-07-17 LAB — GLUCOSE, CAPILLARY
Glucose-Capillary: 88 mg/dL (ref 70–99)
Glucose-Capillary: 146 mg/dL — ABNORMAL HIGH (ref 70–99)
Glucose-Capillary: 110 mg/dL — ABNORMAL HIGH (ref 70–99)
Glucose-Capillary: 95 mg/dL (ref 70–99)

## 2024-07-17 MED ORDER — ENOXAPARIN SODIUM 40 MG/0.4ML IJ SOSY
40.0000 mg | PREFILLED_SYRINGE | INTRAMUSCULAR | Status: DC
  Administered 2024-07-17 – 2024-07-20 (×4): 40 mg via SUBCUTANEOUS
  Filled 2024-07-17 (×4): qty 0.4

## 2024-07-17 NOTE — Plan of Care (Signed)

## 2024-07-17 NOTE — Plan of Care (Signed)

## 2024-07-17 NOTE — Progress Notes (Signed)
 Progress Note   Patient: [name] FMW:[id number] DOB: 1941/08/08 DOA: 07/12/2024     4 DOS: the patient was seen and examined on 07/17/2024   Brief hospital course: Mr. Vanleer was admitted to the hospital with the working diagnosis of heart failure.   83 yo male with the past medical history of hypertension, hyperlipidemia, T2DM, coronary artery disease, CKD stage 3a, BPH, biplar and OSA who presented with dyspnea.  Reported one moth of worsening lower extremity edema, and dyspnea on exertion. Positive 15 lbs weight gain and expanding edema to his scrotum. Symptoms refractive to loop diuretic at home.  At the time of his initial physical examination his blood pressure was 127/76, HR 60, RR 18 and 02 saturation 95% Lungs with bilateral rales with no wheezing, heart with S1 and S2 present and regular with no gallops, rubs or murmurs, abdomen with no distention, positive lower extremity edema.   Na 137, K 4,4 CL 104 bicarbonate 24 glucose 98 bun 35 cr 1,42  BNP 4,159  Wbc 7.4 hgb 10.6 plt 224   Sars covid 19 negative  Influenza negative RSV negative   Chest radiograph with hypoinflation, positive cardiomegaly, bilateral hilar vascular congestion, fluid in the right fissure, bilateral pleural effusions.   EKG 60 bpm, normal axis, qtc 476, right bundle branch block, sinus rhythm with 1st degree AV block with no significant ST segment or T wave changes.   Patient was placed on furosemide for diuresis.  Echocardiogram with reduced LV systolic function and severe aortic stenosis.  10/03 worsening renal function, PICC Line placed SVO 69.3  10/04 continue diuresis. 10/05 volume status has improved.   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 Hypokinetic antero lateral wall, anterior septum, inferior wall, posterior wall,  mid infero septal segment, basal anterior segment, and basal inferoseptal segment.  Grade II diastolic dysfunction, (pseudo normalization EA), interventricular septum is flattened in systole, RV systolic function with moderate reduction, mild RV enlargement, LA and RA with mild dilatation, mild mitral valve regurgitation, severe aortic valve stenosis.   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   Urine output documented 3,880 ml Systolic blood pressure 100 mmHg range.  SV02 68.3   Continue diuresis with furosemide 80 mg IV bid  Holding on spironolactone and SGLT 2 inh due to acutely reduced GFR.  Possible addition of further afterload reduction with ARB or Ace inh when renal function more stable.   Acute hypoxemic respiratory failure.  Follow up chest radiograph 10/02 continue with signs of pulmonary edema.  02 saturation today 96  % on 3 L.min per [place]  Patient will need supplemental home 02 for pulmonary hypertension.   Essential hypertension Continue blood pressure monitoring  Stage 3a chronic kidney disease (HCC) AKI  Volume status is improving, renal function today with serum cr at 1,43 with K at 4,1 and serum bicarbonate at 36  Na 135   Continue diuresis and follow up renal function in am.   Type 2 diabetes mellitus with hyperlipidemia (HCC) Glucose has been controlled Resume insulin sliding scale.  Continue with statin therapy  Acquired hypothyroidism Continue levothyroxine.   BPH (benign prostatic hyperplasia) No signs of urinary retention   GERD Continue pantoprazole.   Bipolar disorder (HCC) No current medications, follow up as outpatient   Sleep apnea Cpap         Subjective: Patient  is feeling better, dyspnea and edema are improving, no PND orthopnea or peripheral edema   Physical Exam: Vitals:   07/16/24 1954 07/16/24 2304 07/17/24 0342 07/17/24 0730  BP: (!) 92/52 113/69 109/62 (!) 111/59  Pulse: (!) 54 (!) 55 (!) 46 (!) 55  Resp:  20 20 20 20   Temp: 97.6 F (36.4 C) (!) 97.5 F (36.4 C) 97.7 F (36.5 C) 98.1 F (36.7 C)  TempSrc: Oral Oral Oral Oral  SpO2: 100% 100% 100% 96%  Weight:   72.8 kg   Height:       Neurology awake and alert ENT with mild pallor Cardiovascular with S1 with very faint S2 with no gallops, or rubs, positive systolic murmur crescendo decrescendo right upper sternal border with radiation to the carotids No JVD Respiratory with no rales or wheezing, no rhonchi  Abdomen with no distention  Trace lower extremity edema   Data Reviewed:    Family Communication: no family at the bedside   Disposition: Status is: Inpatient Remains inpatient appropriate because:recovering heart failure.   Planned Discharge Destination: Home     Author: Elidia Toribio Furnace, MD 07/17/2024 11:05 AM  For on call review [email or web address].

## 2024-07-17 NOTE — Progress Notes (Signed)
   Rounding Note    Patient Name: [name] Date of Encounter: 07/17/2024  [facility] Cardiologist: Alm Clay, MD   Subjective   No acute events overnight.    Vital Signs    Vitals:   07/16/24 1954 07/16/24 2304 07/17/24 0342 07/17/24 0730  BP: (!) 92/52 113/69 109/62 (!) 111/59  Pulse: (!) 54 (!) 55 (!) 46 (!) 55  Resp: 20 20 20 20   Temp: 97.6 F (36.4 C) (!) 97.5 F (36.4 C) 97.7 F (36.5 C) 98.1 F (36.7 C)  TempSrc: Oral Oral Oral Oral  SpO2: 100% 100% 100% 96%  Weight:   72.8 kg   Height:        Intake/Output Summary (Last 24 hours) at 07/17/2024 0916 Last data filed at 07/17/2024 0700 Gross per 24 hour  Intake --  Output 3480 ml  Net -3480 ml      07/17/2024    3:42 AM 07/16/2024    4:27 AM 07/15/2024    6:22 AM  Last 3 Weights  Weight (lbs) 160 lb 7.9 oz 162 lb 4.1 oz 166 lb 4.8 oz  Weight (kg) 72.8 kg 73.6 kg 75.433 kg      Telemetry    Sinus- personally Reviewed  ECG    Personally Reviewed  Physical Exam   GEN: No acute distress.   Cardiac: RRR, no murmurs, rubs, or gallops.  1+ pitting bilateral lower extremity edema.   Respiratory: Clear to auscultation bilaterally. Psych: Normal affect   Assessment & Plan    #Acute on chronic systolic heart failure #Severe aortic stenosis EF 30 to 35%.  NYHA class III.  Still volume overloaded.  Creatinine improving with diuresis.    Continue Lasix 80 mg IV twice daily  TAVR workup ongoing   Ole T. Cindie, MD, [facility], [facility] Cardiac Electrophysiology

## 2024-07-18 ENCOUNTER — Inpatient Hospital Stay (HOSPITAL_COMMUNITY)
Admission: EM | Disposition: A | Discharge status: Home Health | Payer: Self-pay | Source: Ambulatory Visit | Attending: Internal Medicine

## 2024-07-18 DIAGNOSIS — N179 Acute kidney failure, unspecified: Secondary | ICD-10-CM

## 2024-07-18 DIAGNOSIS — I2510 Atherosclerotic heart disease of native coronary artery without angina pectoris: Secondary | ICD-10-CM | POA: Diagnosis not present

## 2024-07-18 DIAGNOSIS — I5023 Acute on chronic systolic (congestive) heart failure: Secondary | ICD-10-CM | POA: Diagnosis not present

## 2024-07-18 DIAGNOSIS — E1169 Type 2 diabetes mellitus with other specified complication: Secondary | ICD-10-CM | POA: Diagnosis not present

## 2024-07-18 DIAGNOSIS — N1831 Chronic kidney disease, stage 3a: Secondary | ICD-10-CM | POA: Diagnosis not present

## 2024-07-18 DIAGNOSIS — I5021 Acute systolic (congestive) heart failure: Secondary | ICD-10-CM | POA: Diagnosis not present

## 2024-07-18 DIAGNOSIS — I5082 Biventricular heart failure: Secondary | ICD-10-CM | POA: Diagnosis not present

## 2024-07-18 DIAGNOSIS — I350 Nonrheumatic aortic (valve) stenosis: Secondary | ICD-10-CM | POA: Diagnosis not present

## 2024-07-18 DIAGNOSIS — I10 Essential (primary) hypertension: Secondary | ICD-10-CM | POA: Diagnosis not present

## 2024-07-18 HISTORY — PX: RIGHT HEART CATH AND CORONARY ANGIOGRAPHY: CATH118264

## 2024-07-18 LAB — POCT I-STAT EG7
Acid-Base Excess: 10 mmol/L — ABNORMAL HIGH (ref 0.0–2.0)
Acid-Base Excess: 11 mmol/L — ABNORMAL HIGH (ref 0.0–2.0)
Acid-Base Excess: 1 mmol/L (ref 0.0–2.0)
Acid-Base Excess: 2 mmol/L (ref 0.0–2.0)
Bicarbonate: 36.4 mmol/L — ABNORMAL HIGH (ref 20.0–28.0)
Bicarbonate: 36.4 mmol/L — ABNORMAL HIGH (ref 20.0–28.0)
Bicarbonate: 25.4 mmol/L (ref 20.0–28.0)
Bicarbonate: 27 mmol/L (ref 20.0–28.0)
Calcium, Ion: 1.15 mmol/L (ref 1.15–1.40)
Calcium, Ion: 1.17 mmol/L (ref 1.15–1.40)
Calcium, Ion: 1.09 mmol/L — ABNORMAL LOW (ref 1.15–1.40)
Calcium, Ion: 1.15 mmol/L (ref 1.15–1.40)
HCT: 34 % — ABNORMAL LOW (ref 39.0–52.0)
HCT: 35 % — ABNORMAL LOW (ref 39.0–52.0)
HCT: 46 % (ref 39.0–52.0)
HCT: 47 % (ref 39.0–52.0)
Hemoglobin: 11.6 g/dL — ABNORMAL LOW (ref 13.0–17.0)
Hemoglobin: 11.9 g/dL — ABNORMAL LOW (ref 13.0–17.0)
Hemoglobin: 15.6 g/dL (ref 13.0–17.0)
Hemoglobin: 16 g/dL (ref 13.0–17.0)
O2 Saturation: 60 %
O2 Saturation: 60 %
O2 Saturation: 63 %
O2 Saturation: 70 %
Potassium: 4 mmol/L (ref 3.5–5.1)
Potassium: 4 mmol/L (ref 3.5–5.1)
Potassium: 4.3 mmol/L (ref 3.5–5.1)
Potassium: 4.4 mmol/L (ref 3.5–5.1)
Sodium: 133 mmol/L — ABNORMAL LOW (ref 135–145)
Sodium: 134 mmol/L — ABNORMAL LOW (ref 135–145)
Sodium: 133 mmol/L — ABNORMAL LOW (ref 135–145)
Sodium: 134 mmol/L — ABNORMAL LOW (ref 135–145)
TCO2: 38 mmol/L — ABNORMAL HIGH (ref 22–32)
TCO2: 38 mmol/L — ABNORMAL HIGH (ref 22–32)
TCO2: 27 mmol/L (ref 22–32)
TCO2: 28 mmol/L (ref 22–32)
pCO2, Ven: 53.5 mmHg (ref 44–60)
pCO2, Ven: 54 mmHg (ref 44–60)
pCO2, Ven: 37.8 mmHg — ABNORMAL LOW (ref 44–60)
pCO2, Ven: 40.6 mmHg — ABNORMAL LOW (ref 44–60)
pH, Ven: 7.437 — ABNORMAL HIGH (ref 7.25–7.43)
pH, Ven: 7.44 — ABNORMAL HIGH (ref 7.25–7.43)
pH, Ven: 7.431 — ABNORMAL HIGH (ref 7.25–7.43)
pH, Ven: 7.436 — ABNORMAL HIGH (ref 7.25–7.43)
pO2, Ven: 31 mmHg — CL (ref 32–45)
pO2, Ven: 31 mmHg — CL (ref 32–45)
pO2, Ven: 32 mmHg (ref 32–45)
pO2, Ven: 35 mmHg (ref 32–45)

## 2024-07-18 LAB — GLUCOSE, CAPILLARY
Glucose-Capillary: 92 mg/dL (ref 70–99)
Glucose-Capillary: 73 mg/dL (ref 70–99)
Glucose-Capillary: 146 mg/dL — ABNORMAL HIGH (ref 70–99)
Glucose-Capillary: 134 mg/dL — ABNORMAL HIGH (ref 70–99)

## 2024-07-18 LAB — COOXEMETRY PANEL
Carboxyhemoglobin: 1 % (ref 0.5–1.5)
Methemoglobin: <0.7 % (ref 0.0–1.5)
O2 Saturation: 57.3 %
Total hemoglobin: 11.9 g/dL — ABNORMAL LOW (ref 12.0–16.0)

## 2024-07-18 LAB — BASIC METABOLIC PANEL WITH GFR
Anion gap: 10 (ref 5–15)
BUN: 45 mg/dL — ABNORMAL HIGH (ref 8–23)
CO2: 32 mmol/L (ref 22–32)
Calcium: 9 mg/dL (ref 8.9–10.3)
Chloride: 92 mmol/L — ABNORMAL LOW (ref 98–111)
Creatinine, Ser: 1.53 mg/dL — ABNORMAL HIGH (ref 0.61–1.24)
GFR, Estimated: 45 mL/min — ABNORMAL LOW (ref >60)
Glucose, Bld: 80 mg/dL (ref 70–99)
Potassium: 4 mmol/L (ref 3.5–5.1)
Sodium: 134 mmol/L — ABNORMAL LOW (ref 135–145)

## 2024-07-18 LAB — MAGNESIUM: Magnesium: 2 mg/dL (ref 1.7–2.4)

## 2024-07-18 SURGERY — RIGHT HEART CATH AND CORONARY ANGIOGRAPHY
Anesthesia: LOCAL

## 2024-07-18 MED ORDER — FUROSEMIDE 10 MG/ML IJ SOLN: 80.0000 mg | Freq: Two times a day (BID) | INTRAMUSCULAR | Status: DC

## 2024-07-18 MED ORDER — HEPARIN SODIUM (PORCINE) 1000 UNIT/ML IJ SOLN
INTRAMUSCULAR | Status: DC | PRN
  Administered 2024-07-18: 4000 [IU] via INTRAVENOUS

## 2024-07-18 MED ORDER — SODIUM CHLORIDE 0.9% FLUSH: 3.0000 mL | INTRAVENOUS | Status: DC | PRN

## 2024-07-18 MED ORDER — MIDAZOLAM HCL 2 MG/2ML IJ SOLN
INTRAMUSCULAR | Status: AC
  Filled 2024-07-18: qty 2

## 2024-07-18 MED ORDER — LIDOCAINE HCL (PF) 1 % IJ SOLN
INTRAMUSCULAR | Status: AC
  Filled 2024-07-18: qty 30

## 2024-07-18 MED ORDER — IOHEXOL 350 MG/ML SOLN
INTRAVENOUS | Status: DC | PRN
  Administered 2024-07-18: 30 mL via INTRA_ARTERIAL

## 2024-07-18 MED ORDER — ASPIRIN 81 MG PO CHEW
81.0000 mg | CHEWABLE_TABLET | ORAL | Status: AC
  Administered 2024-07-18: 81 mg via ORAL
  Filled 2024-07-18: qty 1

## 2024-07-18 MED ORDER — SODIUM CHLORIDE 0.9% FLUSH
3.0000 mL | Freq: Two times a day (BID) | INTRAVENOUS | Status: DC
  Administered 2024-07-18: 3 mL via INTRAVENOUS

## 2024-07-18 MED ORDER — SODIUM CHLORIDE 0.9 % IV SOLN: 250.0000 mL | INTRAVENOUS | Status: DC | PRN

## 2024-07-18 MED ORDER — MIDAZOLAM HCL 2 MG/2ML IJ SOLN
INTRAMUSCULAR | Status: DC | PRN
  Administered 2024-07-18: 1 mg via INTRAVENOUS

## 2024-07-18 MED ORDER — VERAPAMIL HCL 2.5 MG/ML IV SOLN
INTRAVENOUS | Status: AC
  Filled 2024-07-18: qty 2

## 2024-07-18 MED ORDER — LIDOCAINE HCL (PF) 1 % IJ SOLN
INTRAMUSCULAR | Status: DC | PRN
  Administered 2024-07-18: 5 mL

## 2024-07-18 MED ORDER — LIDOCAINE HCL (PF) 1 % IJ SOLN
INTRAMUSCULAR | Status: DC | PRN
  Administered 2024-07-18: 2 mL

## 2024-07-18 MED ORDER — FENTANYL CITRATE (PF) 100 MCG/2ML IJ SOLN
INTRAMUSCULAR | Status: DC | PRN
  Administered 2024-07-18 (×2): 25 ug via INTRAVENOUS

## 2024-07-18 MED ORDER — HEPARIN (PORCINE) IN NACL 1000-0.9 UT/500ML-% IV SOLN
INTRAVENOUS | Status: DC | PRN
  Administered 2024-07-18: 1000 mL via SURGICAL_CAVITY

## 2024-07-18 MED ORDER — FENTANYL CITRATE (PF) 100 MCG/2ML IJ SOLN
INTRAMUSCULAR | Status: AC
  Filled 2024-07-18: qty 2

## 2024-07-18 MED ORDER — HEPARIN SODIUM (PORCINE) 1000 UNIT/ML IJ SOLN
INTRAMUSCULAR | Status: AC
  Filled 2024-07-18: qty 10

## 2024-07-18 SURGICAL SUPPLY — 9 items
CATH 5FR JL3.5 JR4 ANG PIG MP (CATHETERS) IMPLANT
CATH BALLN WEDGE 5F 110CM (CATHETERS) IMPLANT
CATH INFINITI 5FR JL4 (CATHETERS) IMPLANT
DEVICE RAD COMP TR BAND LRG (VASCULAR PRODUCTS) IMPLANT
GLIDESHEATH SLEND SS 6F .021 (SHEATH) IMPLANT
GUIDEWIRE INQWIRE 1.5J.035X260 (WIRE) IMPLANT
PACK CARDIAC CATHETERIZATION (CUSTOM PROCEDURE TRAY) ×1 IMPLANT
SET ATX-X65L (MISCELLANEOUS) IMPLANT
SHEATH GLIDE SLENDER 4/5FR (SHEATH) IMPLANT

## 2024-07-18 NOTE — Progress Notes (Signed)
 PT Cancellation Note  Patient Details Name: [name] MRN: [id number] DOB: 05-12-1941   Cancelled Treatment:    Reason Eval/Treat Not Completed: (P) Patient at procedure or test/unavailable (pt off unit -at cath lab.) Will continue efforts per PT plan of care as schedule permits once medically appropriate.   Connell HERO Corda Shutt 07/18/2024, 11:44 AM

## 2024-07-18 NOTE — Consult Note (Incomplete)
 HEART AND VASCULAR CENTER   MULTIDISCIPLINARY HEART VALVE TEAM  Cardiology Consultation:   Patient ID: Autry Prust MRN: [id number]; DOB: Aug 07, 1941  Admit date: 07/12/2024 Date of Consult: 07/19/2024  Primary Care Provider: Nichole Senior, MD [facility] Cardiologist: Alm Clay, MD  [facility] Electrophysiologist:  None    Patient Profile:   Gerrett Loman is a 83 y.o. 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 Rolan.  History of Present Illness:   Mr. Privott lives in [place] with his wife.  She has medical problems and he is her primary caregiver. He has 2 living children. He is a very active and continues to drive and takes care of all of his own ADLs including cooking and cleaning. He walks without the aid of a walker or cane.  Of note, he has extremely poor dentition and has not been to the dentist in many years.  He has wanted to get dental extractions and dentures made, but has cost has been a deterrent.   He reports having a murmur his entire life and told it was not a problem.  He has never been followed by cardiologist.  He was in his usual state of health until the past 2 to 3 months when he began experiencing progressive shortness of breath and lower extremity/scrotal edema.  He presented to the [facility] ED on 07/12/2024 for further evaluation and found to be massively volume overloaded.   Echo 07/13/24 showed 30-35% with global hypokinesis and disproportionately severe hypokinesis/akinesis in the mid-apical left anterior and anteroseptal walls, moderate RV dysfunction/enlargement, mild MR, and severe AS with mean gradient 33 mm hg, Vmax 3.93 m/s, AVA 0.77, DVI 0.19, SVI  41. There was interventricular septum flattened in systole, consistent with right ventricular pressure overload. He was diuresed initially with concern for low output with AKI w/ attempts at diuresis: Peak creatinine 1.91. Co-ox have been marginal, but okay.  The advanced heart failure team assumed care and was able to diurese him 15.7L and 33lbs (183 --> 150 lbs) without the use of inotrope support.  His renal function improved with diuresis with a mild bump after his cardiac catheterization, creat 1.61.  [facility] 07/18/24 showed 100% pLCX stenosis with R-->L collaterals and 90% mLAD as well as mildly elevated filling pressures (PA mean 38 mm hg, PCWP 18 mm hg with V waves to 26), CO/CI 4.67L/min, 2.56 L/minm2.   Structural heart team was consulted for consideration of TAVR and PCI. The patient is seen at bedside sitting alone. He was previously walking the halls with a nurse tech and moving extremely well.  He reports a marked improvement in his breathing, lower extremity and scrotal edema as well as orthopnea and PND.  He is eager to proceed with any procedure that will help improve his symptoms and get him home with his wife.      Past Medical History:  Diagnosis Date   Hypertension     Past Surgical History:  Procedure Laterality Date   APPENDECTOMY     RIGHT HEART CATH AND CORONARY ANGIOGRAPHY N/A 07/18/2024   Procedure: RIGHT HEART CATH AND CORONARY ANGIOGRAPHY;  Surgeon: Zenaida Morene PARAS, MD;  Location: [facility] INVASIVE CV LAB;  Service: Cardiovascular;  Laterality: N/A;     Home Medications:  Prior to Admission medications   Medication Sig Start Date End Date Taking? Authorizing Provider  amLODipine  (NORVASC ) 2.5 MG tablet Take 2.5 mg by mouth daily.   Yes [provider]  carboxymethylcellulose (REFRESH PLUS) 0.5 % SOLN Place 1 drop into both eyes daily as needed (dry eyes).   Yes [provider]  docusate sodium  (COLACE) 100 MG capsule Take 200 mg by mouth 2 (two) times  daily as needed for mild constipation.   Yes [provider]  fluticasone (FLONASE) 50 MCG/ACT nasal spray Place 2 sprays into both nostrils daily as needed for allergies. 06/24/18  Yes [provider]  furosemide (LASIX) 40 MG tablet Take 40 mg by mouth daily.   Yes [provider]  levothyroxine (SYNTHROID) 75 MCG tablet Take 75 mcg by mouth daily before breakfast.   Yes [provider]  losartan (COZAAR) 50 MG tablet Take 50 mg by mouth daily.   Yes [provider]  polyethylene glycol (MIRALAX  / GLYCOLAX ) 17 g packet Take 17 g by mouth 2 (two) times daily as needed for mild constipation.   Yes [provider]  amLODipine  (NORVASC ) 5 MG tablet Take 1 tablet (5 mg total) by mouth daily. 02/11/22 02/11/23  Dennise Lavada POUR, MD    Inpatient Medications: Scheduled Meds:  aspirin  81 mg Oral Daily   atorvastatin  80 mg Oral Daily   Chlorhexidine Gluconate Cloth  6 each Topical Daily   dapagliflozin propanediol  10 mg Oral Daily   enoxaparin  (LOVENOX ) injection  40 mg Subcutaneous Q24H   insulin aspart  0-9 Units Subcutaneous TID WC   levothyroxine  75 mcg Oral Q0600   sodium chloride  flush  10-40 mL Intracatheter Q12H   sodium chloride  flush  3 mL Intravenous Q12H   Continuous Infusions:  PRN Meds: acetaminophen  **OR** acetaminophen , polyethylene glycol, sodium chloride , sodium chloride  flush  Allergies:   No Known Allergies  Social History:   Social History   Socioeconomic History   Marital status: Married    Spouse name: Not on file   Number of children: Not on file   Years of education: Not on file   Highest education level: Not on file  Occupational History   Not on file  Tobacco Use   Smoking status: Former    Types: Cigarettes   Smokeless tobacco: Never  Substance and Sexual Activity   Alcohol use: Yes   Drug use: Never   Sexual activity: Not on file  Other Topics Concern   Not on file  Social History Narrative    Not on file   Social Drivers of Health   Financial Resource Strain: Not on file  Food Insecurity: Patient Declined (07/12/2024)   Hunger Vital Sign    Worried About Running Out of Food in the Last Year: Patient declined    Ran Out of Food in the Last Year: Patient declined  Transportation Needs: Patient Declined (07/12/2024)   PRAPARE - Administrator, Civil Service (Medical): Patient declined    Lack of Transportation (Non-Medical): Patient declined  Physical Activity: Not on file  Stress: Not on file  Social Connections: Patient Declined (07/12/2024)   Social Connection and Isolation Panel    Frequency of Communication with Friends and Family: Patient declined    Frequency of Social Gatherings with Friends and Family: Patient declined    Attends Religious Services: Patient declined    Active Member  of Clubs or Organizations: Patient declined    Attends Banker Meetings: Patient declined    Marital Status: Patient declined  Intimate Partner Violence: Not At Risk (07/12/2024)   Humiliation, Afraid, Rape, and Kick questionnaire    Fear of Current or Ex-Partner: No    Emotionally Abused: No    Physically Abused: No    Sexually Abused: No    Family History:    Family History  Problem Relation Age of Onset   CAD Father    Diabetes Mellitus II Maternal Grandmother      ROS:  Please see the history of present illness.   All other ROS reviewed and negative.     Physical Exam/Data:   Vitals:   07/19/24 0413 07/19/24 0500 07/19/24 0725 07/19/24 0800  BP: 110/88  117/63 117/63  Pulse: (!) 55  64 61  Resp: 18  (!) 23   Temp: 98.4 F (36.9 C)  97.8 F (36.6 C) 98.1 F (36.7 C)  TempSrc: Oral  Oral Oral  SpO2: 99%  99% 95%  Weight:  68.3 kg    Height:        Intake/Output Summary (Last 24 hours) at 07/19/2024 1148 Last data filed at 07/19/2024 1100 Gross per 24 hour  Intake 740 ml  Output 775 ml  Net -35 ml      07/19/2024    5:00 AM 07/18/2024     5:24 AM 07/17/2024    3:42 AM  Last 3 Weights  Weight (lbs) 150 lb 9.2 oz 151 lb 160 lb 7.9 oz  Weight (kg) 68.3 kg 68.493 kg 72.8 kg     Body mass index is 24.3 kg/m.  General:  Well nourished, well developed, in no acute distress HEENT: normal Lymph: no adenopathy Neck: no JVD Cardiac:  normal S1, S2; RRR; harsh systolic murmur heard best at the right upper sternal border Lungs:  clear to auscultation bilaterally, no wheezing, rhonchi or rales  Abd: soft, nontender, no hepatomegaly  Ext: no edema Musculoskeletal:  No deformities, BUE and BLE strength normal and equal Skin: warm and dry  Neuro:  CNs 2-12 intact, no focal abnormalities noted Psych:  Normal affect   EKG:  The EKG was personally reviewed and demonstrates:  sinus with RBBB, 1st deg AV block (PR ), HR 60 Telemetry:  Telemetry was personally reviewed and demonstrates:  NSR, HR 60s  Cardiac Studies & Procedures   ______________________________________________________________________________________________ CARDIAC CATHETERIZATION  CARDIAC CATHETERIZATION 07/18/2024  Conclusion   Prox Cx lesion is 100% stenosed.   Mid LAD lesion is 90% stenosed.  HEMODYNAMICS: RA:       8 mmHg (mean) RV:       65/4, 8 mmHg PA:       65/23 mmHg (38 mean) PCWP: 18 mmHg (mean) with v waves to 26  Estimated Fick CO/CI   4.67L/min, 2.56L/min/m2  IMPRESSION: Right heart catheterization and coronary angiography as part of pre TAVR workup Occluded Lcx with right to left collaterals Severe LAD disease. Discussed with Dr. Mady, would plan for ideally staged PCI prior to TAVR Mildly elevated filling pressures Normal cardiac index by assumed Fick  RECOMMENDATIONS: Discuss in structural conference  Findings Coronary Findings Diagnostic  Dominance: Right  Left Anterior Descending Supplies 1 diagonal branch before wrapping the apex. There is a severe, 90% lesion distal to D1. Otherwise moderate diffuse disease. Mid LAD  lesion is 90% stenosed.  Left Circumflex Occluded in the proximal segment, distal vessel fills by right to left collaterals.  Large AV circumflex providing an atrial branch. Prox Cx lesion is 100% stenosed.  Right Coronary Artery Dominant for posterior circulation, supplies the PDA and a branching Pl. Large, ectatic vessel.  Mild 40% lesion in the mid RCA, otherwise diffuse disease. Supplies right to left collaterals to the Lcx. Dist RCA lesion is 40% stenosed.  Intervention  No interventions have been documented.     ECHOCARDIOGRAM  ECHOCARDIOGRAM COMPLETE 07/13/2024  Narrative ECHOCARDIOGRAM REPORT    Patient Name:   FERRY MATTHIS Date of Exam: 07/13/2024 Medical Rec #:  [id number]   Height:       66.0 in Accession #:    [id number]  Weight:       182.1 lb Date of Birth:  [birth year]    BSA:          1.922 m Patient Age:    83 years    BP:           135/83 mmHg Patient Gender: M           HR:           62 bpm. Exam Location:  Inpatient  Procedure: 2D Echo, Cardiac Doppler, Color Doppler and Intracardiac Opacification Agent (Both Spectral and Color Flow Doppler were utilized during procedure).  Indications:    CHF-Acute Systolic I50.21  History:        Patient has no prior history of Echocardiogram examinations. CHF, CAD, CKD, stage 3; Risk Factors:Hypertension, Dyslipidemia, Sleep Apnea and Diabetes.  Sonographer:    Thea Norlander RCS Referring Phys: [id number] MARSA NOVAK MELVIN  IMPRESSIONS   1. No left ventricular thrombus is seen (Definity contrast was used). Although there is global hypokinesis, there is disproportionately severe hypokinesis/akinesis in the mid-apical left anterior and anteroseptal walls. Left ventricular ejection fraction, by estimation, is 30 to 35%. The left ventricle has moderately decreased function. The left ventricle demonstrates regional wall motion abnormalities (see scoring diagram/findings for description). Left ventricular diastolic  parameters are consistent with Grade II diastolic dysfunction (pseudonormalization). Elevated left atrial pressure. There is the interventricular septum is flattened in systole, consistent with right ventricular pressure overload. 2. Right ventricular systolic function is moderately reduced. The right ventricular size is mildly enlarged. Tricuspid regurgitation signal is inadequate for assessing PA pressure. 3. Left atrial size was mildly dilated. 4. Right atrial size was mildly dilated. 5. The mitral valve is degenerative. Mild mitral valve regurgitation. No evidence of mitral stenosis. The mean mitral valve gradient is 3.0 mmHg with average heart rate of 66 bpm. Moderate to severe mitral annular calcification. 6. The aortic valve is tricuspid. There is severe calcifcation of the aortic valve. There is severe thickening of the aortic valve. Aortic valve regurgitation is trivial. Severe aortic valve stenosis. 7. The inferior vena cava is dilated in size with <50% respiratory variability, suggesting right atrial pressure of 15 mmHg.  FINDINGS Left Ventricle: No left ventricular thrombus is seen (Definity contrast was used). Although there is global hypokinesis, there is disproportionately severe hypokinesis/akinesis in the mid-apical left anterior and anteroseptal walls. Left ventricular ejection fraction, by estimation, is 30 to 35%. The left ventricle has moderately decreased function. The left ventricle demonstrates regional wall motion abnormalities. Definity contrast agent was given IV to delineate the left ventricular endocardial borders. The left ventricular internal cavity size was normal in size. There is borderline concentric left ventricular hypertrophy. The interventricular septum is flattened in systole, consistent with right ventricular pressure overload. Left ventricular diastolic parameters are consistent with Grade II diastolic dysfunction (pseudonormalization).  Elevated left atrial  pressure.   LV Wall Scoring: The mid and distal anterior wall and entire apex are akinetic. The antero-lateral wall, anterior septum, inferior wall, posterior wall, mid inferoseptal segment, basal anterior segment, and basal inferoseptal segment are hypokinetic.  Right Ventricle: The right ventricular size is mildly enlarged. No increase in right ventricular wall thickness. Right ventricular systolic function is moderately reduced. Tricuspid regurgitation signal is inadequate for assessing PA pressure.  Left Atrium: Left atrial size was mildly dilated.  Right Atrium: Right atrial size was mildly dilated.  Pericardium: There is no evidence of pericardial effusion.  Mitral Valve: The mitral valve is degenerative in appearance. Moderate to severe mitral annular calcification. Mild mitral valve regurgitation. No evidence of mitral valve stenosis. MV peak gradient, 9.4 mmHg. The mean mitral valve gradient is 3.0 mmHg with average heart rate of 66 bpm.  Tricuspid Valve: The tricuspid valve is normal in structure. Tricuspid valve regurgitation is mild.  Aortic Valve: The aortic valve is tricuspid. There is severe calcifcation of the aortic valve. There is severe thickening of the aortic valve. Aortic valve regurgitation is trivial. Severe aortic stenosis is present. Aortic valve mean gradient measures 33.0 mmHg. Aortic valve peak gradient measures 61.8 mmHg. Aortic valve area, by VTI measures 0.77 cm.  Pulmonic Valve: The pulmonic valve was normal in structure. Pulmonic valve regurgitation is mild. No evidence of pulmonic stenosis.  Aorta: The aortic root and ascending aorta are structurally normal, with no evidence of dilitation.  Venous: The inferior vena cava is dilated in size with less than 50% respiratory variability, suggesting right atrial pressure of 15 mmHg.  IAS/Shunts: No atrial level shunt detected by color flow Doppler.   LEFT VENTRICLE PLAX 2D LVIDd:         4.92 cm    Diastology LVIDs:         4.16 cm   LV e' medial:    3.00 cm/s LV PW:         1.14 cm   LV E/e' medial:  43.0 LV IVS:        1.20 cm   LV e' lateral:   5.81 cm/s LVOT diam:     2.26 cm   LV E/e' lateral: 22.2 LV SV:         79 LV SV Index:   41 LVOT Area:     4.01 cm   RIGHT VENTRICLE            IVC RV S prime:     6.87 cm/s  IVC diam: 2.60 cm TAPSE (M-mode): 1.5 cm  LEFT ATRIUM             Index        RIGHT ATRIUM           Index LA diam:        4.33 cm 2.25 cm/m   RA Area:     21.60 cm LA Vol (A2C):   57.2 ml 29.77 ml/m  RA Volume:   66.10 ml  34.40 ml/m LA Vol (A4C):   42.1 ml 21.91 ml/m LA Biplane Vol: 54.6 ml 28.41 ml/m AORTIC VALVE AV Area (Vmax):    0.75 cm AV Area (Vmean):   0.72 cm AV Area (VTI):     0.77 cm AV Vmax:           393.00 cm/s AV Vmean:          265.500 cm/s AV VTI:  1.020 m AV Peak Grad:      61.8 mmHg AV Mean Grad:      33.0 mmHg LVOT Vmax:         73.90 cm/s LVOT Vmean:        47.700 cm/s LVOT VTI:          0.197 m LVOT/AV VTI ratio: 0.19  AORTA Ao Root diam: 3.79 cm Ao Asc diam:  3.65 cm  MITRAL VALVE MV Area (PHT): 3.85 cm     SHUNTS MV Area VTI:   2.05 cm     Systemic VTI:  0.20 m MV Peak grad:  9.4 mmHg     Systemic Diam: 2.26 cm MV Mean grad:  3.0 mmHg MV Vmax:       1.53 m/s MV Vmean:      77.0 cm/s MV Decel Time: 197 msec MV E velocity: 129.00 cm/s MV A velocity: 84.40 cm/s MV E/A ratio:  1.53  Mihai Croitoru MD Electronically signed by Jerel Balding MD Signature Date/Time: 07/13/2024/2:29:54 PM    Final          ______________________________________________________________________________________________      Laboratory Data:  High Sensitivity Troponin:  No results for input(s): TROPONINIHS in the last 720 hours.   Chemistry Recent Labs  Lab 07/17/24 0820 07/18/24 0500 07/18/24 1135 07/18/24 1251 07/18/24 1252 07/19/24 0430  NA 135 134*   < > 134* 133* 133*  K 4.1 4.0   < > 4.3 4.4  4.6  CL 88* 92*  --   --   --  93*  CO2 36* 32  --   --   --  32  GLUCOSE 105* 80  --   --   --  193*  BUN 41* 45*  --   --   --  50*  CREATININE 1.43* 1.53*  --   --   --  1.61*  CALCIUM 9.0 9.0  --   --   --  8.7*  GFRNONAA 48* 45*  --   --   --  42*  ANIONGAP 11 10  --   --   --  8   < > = values in this interval not displayed.    Recent Labs  Lab 07/13/24 0341  PROT 5.7*  ALBUMIN 2.9*  AST 24  ALT 29  ALKPHOS 83  BILITOT 0.8   Hematology Recent Labs  Lab 07/12/24 1514 07/13/24 0341 07/18/24 1135 07/18/24 1251 07/18/24 1252 07/19/24 0430  WBC 7.4 5.5  --   --   --  6.3  RBC 3.67* 3.64*  --   --   --  3.90*  HGB 10.6* 10.6*   < > 15.6 16.0 11.2*  HCT 34.0* 32.7*   < > 46.0 47.0 34.7*  MCV 92.6 89.8  --   --   --  89.0  MCH 28.9 29.1  --   --   --  28.7  MCHC 31.2 32.4  --   --   --  32.3  RDW 15.6* 15.6*  --   --   --  15.1  PLT 224 211  --   --   --  223   < > = values in this interval not displayed.   BNP Recent Labs  Lab 07/12/24 1530  BNP 4,159.6*    DDimer No results for input(s): DDIMER in the last 168 hours.   Radiology/Studies:  CARDIAC CATHETERIZATION Result Date: 07/18/2024   Prox Cx lesion is 100% stenosed.  Mid LAD lesion is 90% stenosed. 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 Discussed with Dr. Mady, would plan for ideally staged PCI prior to TAVR Mildly elevated filling pressures Normal cardiac index by assumed Fick RECOMMENDATIONS: Discuss in structural conference   DG CHEST PORT 1 VIEW Result Date: 07/15/2024 CLINICAL DATA:  PICC line placement. EXAM: PORTABLE CHEST 1 VIEW COMPARISON:  07/14/2024 FINDINGS: Low volume film. The cardio pericardial  silhouette is enlarged. There is pulmonary vascular congestion without overt pulmonary edema. Minimal basilar atelectasis noted with probable small right effusion. Right PICC line tip overlies the region of the SVC/RA junction. Telemetry leads overlie the chest. IMPRESSION: 1. Right PICC line tip overlies the region of the SVC/RA junction. 2. Low volume film with vascular congestion and probable small right effusion. Electronically Signed   By: Camellia Candle M.D.   On: 07/15/2024 12:16    ___ STS Risk Calculator: Procedure Type: Isolated AVR Perioperative Outcome Estimate % Operative Mortality 5.08% Morbidity & Mortality 17.5% Stroke 1.93% Renal Failure 4.56% Reoperation 4.66% Prolonged Ventilation 9.79% Deep Sternal Wound Infection 0.037% Long Hospital Stay (>14 days) 13.6% Short Hospital Stay (<6 days)* 21.8%   ____________________  [place] Cardiomyopathy Questionnaire     07/19/2024    7:41 AM  KCCQ-12  1 a. Ability to shower/bathe Quite a bit limited  1 b. Ability to walk 1 block Extremely limited  1 c. Ability to hurry/jog Other, Did not do  2. Edema feet/ankles/legs Every morning  3. Limited by fatigue All of the time  4. Limited by dyspnea All of the time  5. Sitting up / on 3+ pillows Every night  6. Limited enjoyment of life Extremely limited  8 a. Participation in hobbies Severely limited  8 b. Participation in chores Severely limited  8 c. Visiting family/friends Severely limited     ___________________________  Pre Surgical Assessment: 5 M Walk Test  8M=16.74ft  5 Meter Walk Test- trial 1: 5 seconds 5 Meter Walk Test- trial 2: 5.1 seconds 5 Meter Walk Test- trial 3: 5.0 seconds 5 Meter Walk Test Average: 5 seconds     Assessment and Plan:   Jabori Henegar is a 83 y.o. male with symptoms of severe, stage D2 aortic stenosis with NYHA Class IV symptoms currently admitted for acute biventricular failure and massive volume overload complicated by cardiorenal  syndrome.  Echo 07/13/24 showed 30-35% with global hypokinesis and disproportionately severe hypokinesis/akinesis in the mid-apical left anterior and anteroseptal walls, moderate RV dysfunction/enlargement, mild MR, and severe AS with mean gradient 33 mm hg, Vmax 3.93 m/s, AVA 0.77, DVI 0.19, SVI 41. There was interventricular septum flattened in systole, consistent with right ventricular pressure overload. He was diuresed initially with concern for low output with AKI w/ attempts at diuresis. Co-ox have been marginal, but okay. Never required inotropic support.   [facility] 07/18/24 showed 100% pLCX stenosis with R-->L collaterals and 90% mLAD as well as mildly elevated filling pressures (PA mean 38 mm hg, PCWP 18 mm hg with V waves to 26), CO/CI 4.67L/min, 2.56 L/minm2.  I have reviewed the natural history of aortic  stenosis with the patient. We have discussed the limitations of medical therapy and the poor prognosis associated with symptomatic aortic stenosis. We have reviewed potential treatment options, including palliative medical therapy, conventional surgical aortic valve replacement, and transcatheter aortic valve replacement. We discussed treatment options in the context of this patient's specific comorbid medical conditions.    The patient's predicted risk of mortality with conventional aortic valve replacement is 5.08% primarily based on acute CHF with LV dysfunction, AKI, DMT2, HTN and obstructive CAD. Other significant comorbid conditions include right hear failure. TAVR seems like a reasonable treatment option for this patient pending formal cardiac surgical consultation. We discussed typical evaluation which will require a gated cardiac CTA and a CTA of the chest/abdomen/pelvis to evaluate both his cardiac anatomy and peripheral vasculature.    The patient is currently euvolemic and creatinine is 1.61. Will plan to do cardiac CTs tomorrow if renal function remains stable. If he has anatomy, we  will plan to do inpatient TAVR next Tuesday.  His 90% mLAD stenosis will require revascularization, but it was felt to be safer to treat the valve first followed by staged PCI.   Of note, the patient has extremely poor dentition and will likely require a full mouth extraction.  I will order an orthopantogram to make sure there is no active infection or abscesses and then plan to proceed with dental extractions after TAVR.    For questions or updates, please contact [facility] Please consult [email or web address] for contact info under    Signed, Lamarr Hummer, PA-C  07/19/2024 11:48 AM

## 2024-07-18 NOTE — Interval H&P Note (Signed)
 History and Physical Interval Note:  07/18/2024 11:23 AM  [name]  has presented today for surgery, with the diagnosis of heart failure - aortic stenosis.  The various methods of treatment have been discussed with the patient and family. After consideration of risks, benefits and other options for treatment, the patient has consented to  Procedure(s): RIGHT/LEFT HEART CATH AND CORONARY ANGIOGRAPHY (N/A) as a surgical intervention.  The patient's history has been reviewed, patient examined, no change in status, stable for surgery.  I have reviewed the patient's chart and labs.  Questions were answered to the patient's satisfaction.     [name]

## 2024-07-18 NOTE — Progress Notes (Signed)
 OT Cancellation Note  Patient Details Name: Reinhard Schack MRN: [id number] DOB: Dec 25, 1940   Cancelled Treatment:    Reason Eval/Treat Not Completed: Patient at procedure or test/ unavailable (off unit for heart cath)  Dana Dorner K, OTD, OTR/L SecureChat [facility]erred Acute Rehab [phone]   Laneta POUR Koonce 07/18/2024, 11:13 AM

## 2024-07-18 NOTE — Progress Notes (Signed)
 Patient's identification and falls risk bands are removed from the right extremity, but the limited extremity one (pink arm band) is still on the right wrist d/t the patient's right upper arm  PICC.

## 2024-07-18 NOTE — TOC Initial Note (Signed)
 Transition of Care [facility]) - Initial/Assessment Note    Patient Details  Name: [name] MRN: [id number] Date of Birth: 1941-08-18  Transition [facility] CM/SW Contact:    Justina Delcia Czar, RN Phone Number: [phone] 07/18/2024, 3:38 PM  Clinical Narrative:                 Spoke to pt at bedside. Pt states wife and son are in the home to assist with care. Pt reports he may need Rollator and oxygen for home and Home Health. Reports having scale at  home. Provided pt with Living Better with HF booklet. Discussed low sodium/heart healthy diet.   Will continue to follow for dc needs.   Expected Discharge Plan: Home w Home Health Services Barriers to Discharge: Continued Medical Work up   Patient Goals and CMS Choice Patient states their goals for this hospitalization and ongoing recovery are:: wants to remain independent CMS Medicare.gov Compare Post Acute Care list provided to:: Patient Choice offered to / list presented to : Patient [place] ownership interest in [facility].provided to:: Patient    Expected Discharge Plan and Services   Discharge Planning Services: CM Consult Post Acute Care Choice: Home Health Living arrangements for the past 2 months: Single Family Home                                      Prior Living Arrangements/Services Living arrangements for the past 2 months: Single Family Home Lives with:: Adult Children, Spouse Patient language and need for interpreter reviewed:: Yes Do you feel safe going back to the place where you live?: Yes      Need for Family Participation in Patient Care: No (Comment) Care giver support system in place?: Yes (comment) Current home services: DME (rolling walker) Criminal Activity/Legal Involvement Pertinent to Current Situation/Hospitalization: No - Comment as needed  Activities of Daily Living      Permission Sought/Granted Permission sought to share information with : Case Manager, Family  Supports, PCP Permission granted to share information with : Yes, Verbal Permission Granted  Share Information with NAME: Shanta Dorvil  Permission granted to share info w AGENCY: Home Health, PCP, DME  Permission granted to share info w Relationship: wife  Permission granted to share info w Contact Information: [phone]  Emotional Assessment Appearance:: Appears stated age Attitude/Demeanor/Rapport: Engaged Affect (typically observed): Accepting Orientation: : Oriented to Place, Oriented to  Time, Oriented to Situation, Oriented to Self   Psych Involvement: No (comment)  Admission diagnosis:  Acute CHF (HCC) [I50.9] Patient Active Problem List   Diagnosis Date Noted   Biventricular heart failure, NYHA class 3 (HCC) 07/14/2024   Acute cor pulmonale (HCC) 07/14/2024   Symptomatic severe aortic stenosis with low ejection fraction 07/14/2024   Acquired hypothyroidism 07/12/2024   Stage 3a chronic kidney disease (HCC) 07/12/2024   Acute on chronic systolic CHF (congestive heart failure) (HCC) 07/12/2024   Hyponatremia 02/09/2022   Essential hypertension 02/09/2022   Obesity 12/29/2011   Type 2 diabetes mellitus with hyperlipidemia (HCC) 04/15/2011   Fatty (change of) liver, not elsewhere classified 07/16/2010   Bipolar disorder (HCC) 08/30/2009   Atherosclerotic heart disease of native coronary artery without angina pectoris 08/30/2009   Hyperlipidemia 08/30/2009   BPH (benign prostatic hyperplasia) 08/30/2009   Sleep apnea 08/30/2009   GERD 04/24/2008   FLATULENCE ERUCTATION AND GAS PAIN 04/24/2008   ABDOMINAL PAIN-GENERALIZED  04/24/2008   PCP:  Nichole Senior, MD Pharmacy:   [id number] - [place], [place] - [address] AT [facility] [address] [place] [address] Phone: [phone] Fax: [phone]     Social Drivers of Health (SDOH) Social History: SDOH Screenings   Food Insecurity: Patient Declined  (07/12/2024)  Housing: Unknown (07/12/2024)  Transportation Needs: Patient Declined (07/12/2024)  Utilities: Patient Declined (07/12/2024)  Social Connections: Patient Declined (07/12/2024)  Tobacco Use: Medium Risk (07/12/2024)   SDOH Interventions:     Readmission Risk Interventions     No data to display

## 2024-07-18 NOTE — Plan of Care (Signed)
  Problem: Education: Goal: Knowledge of General Education information will improve Description: Including pain rating scale, medication(s)/side effects and non-pharmacologic comfort measures Outcome: Progressing   Problem: Clinical Measurements: Goal: Will remain free from infection Outcome: Progressing Goal: Diagnostic test results will improve Outcome: Progressing Goal: Respiratory complications will improve Outcome: Progressing   Problem: Nutrition: Goal: Adequate nutrition will be maintained Outcome: Progressing   Problem: Coping: Goal: Level of anxiety will decrease Outcome: Progressing   Problem: Activity: Goal: Capacity to carry out activities will improve Outcome: Progressing   Problem: Cardiac: Goal: Ability to achieve and maintain adequate cardiopulmonary perfusion will improve Outcome: Progressing   Problem: Nutritional: Goal: Maintenance of adequate nutrition will improve Outcome: Progressing   Problem: Skin Integrity: Goal: Risk for impaired skin integrity will decrease Outcome: Progressing   Problem: Tissue Perfusion: Goal: Adequacy of tissue perfusion will improve Outcome: Progressing

## 2024-07-18 NOTE — Progress Notes (Addendum)
 Advanced Heart Failure Rounding Note  Cardiologist: Alm Clay, MD  Chief Complaint: Aortic stenosis Subjective:    Co-ox 57%. CVP 11-12 Diuresed over the weekend, weight overall down 30lbs.  sCr 1.9 >1.6>1.4>1.5  Reports some lightheadedness. Biggest complaint is sinus congestion, has had large volume nasal fluid this morning. Also very concerned about passing in his sleep.   Objective:    Weight Range: 68.5 kg Body mass index is 24.37 kg/m.   Vital Signs:   Temp:  [97.6 F (36.4 C)-98.3 F (36.8 C)] 97.6 F (36.4 C) (10/06 0443) Pulse Rate:  [52-65] 63 (10/06 0443) Resp:  [15-19] 15 (10/06 0443) BP: (90-105)/(56-65) 102/65 (10/06 0443) SpO2:  [97 %-100 %] 100 % (10/06 0443) Weight:  [68.5 kg] 68.5 kg (10/06 0524) Last BM Date :  (PTA)  Weight change: Filed Weights   07/16/24 0427 07/17/24 0342 07/18/24 0524  Weight: 73.6 kg 72.8 kg 68.5 kg   Intake/Output:  Intake/Output Summary (Last 24 hours) at 07/18/2024 0806 Last data filed at 07/18/2024 0441 Gross per 24 hour  Intake 480 ml  Output 1700 ml  Net -1220 ml    Physical Exam    General: Elderly appearing. No distress on RA Cardiac: S1 and S2 present. RUSB 4/6 systolic murmur Resp: Lung sounds coarse and equal B/L Abdomen: Soft, non-tender, non-distended.  Extremities: Warm and dry.  No peripheral edema.  Neuro: Appears to not completely understand situation  Telemetry   ST 50-60s (personally reviewed)  Labs    CBC No results for input(s): WBC, NEUTROABS, HGB, HCT, MCV, PLT in the last 72 hours. Basic Metabolic Panel Recent Labs    89/95/74 0500 07/17/24 0820 07/18/24 0500  NA 135 135 134*  K 4.8 4.1 4.0  CL 94* 88* 92*  CO2 32 36* 32  GLUCOSE 121* 105* 80  BUN 45* 41* 45*  CREATININE 1.63* 1.43* 1.53*  CALCIUM 9.0 9.0 9.0  MG 1.9  --  2.0   BNP (last 3 results) Recent Labs    07/12/24 1530  BNP 4,159.6*   Hemoglobin A1C Recent Labs    07/16/24 1407  HGBA1C  6.2*   Medications:    Scheduled Medications:  atorvastatin  80 mg Oral Daily   Chlorhexidine Gluconate Cloth  6 each Topical Daily   enoxaparin  (LOVENOX ) injection  40 mg Subcutaneous Q24H   furosemide  80 mg Intravenous BID   insulin aspart  0-9 Units Subcutaneous TID WC   levothyroxine  75 mcg Oral Q0600   sodium chloride  flush  10-40 mL Intracatheter Q12H   sodium chloride  flush  3 mL Intravenous Q12H   sodium chloride  flush  3 mL Intravenous Q12H   Infusions:  sodium chloride       PRN Medications: sodium chloride , acetaminophen  **OR** acetaminophen , polyethylene glycol, sodium chloride , sodium chloride  flush, sodium chloride  flush  Patient Profile   83 y/o male w/ HTN, HLD, Type 2 DM, CKD IIIa, hypothyroidism, OSA and bioplar disorder but no prior cardiac history, who presented w/ new HF symptoms and marked volume overload. Echo w/ biventricular failure and severe AS. AHF team consulted for low output HF.   Assessment/Plan   1. 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 Interventricular septum flattened in systole, consistent with  right ventricular pressure overload, moderate RV dysfunction with mild RV enlargement. No prior study for comparison  - etiology likely 2/2 valvular heart disease +/- possible ischemic CM. No h/o CP. Troponins not cycled on admission - initially with concern for low output with AKI w/ attempts at diuresis. Co-ox have been marginal, but ok.  - ultimately needs TAVR, will diurese then start work up - plan for [facility] - co-ox 57. CVP 6 - diuresed total 15L, 30lbs. Hold Lasix today pre-cath.  - no ARB/ARNi, MRA nor dig yet w/ elevated sCr - no ? blocker w/ suspected  low output - no hydral/nitrates in setting of severe AS and soft BP    2. Aortic Stenosis - severe by TTE, AVA by VTI measured at 72 cm, mean gradient 33 mmHg - given age, TAVR likely best suitable option. Will need structural heart team consultation  - note, also w/ baseline RBBB    3. AKI on CKD IIIa - Scr 1.3 on admit, down to 1.6>1.4>1.5 today - suspect cardiorenal   We are aiming for TAVR, will need to diurese first and stabilize creatinine to allow TAVR workup. Initiation of dobutamine may help with this. 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    Length of Stay: 5  [place] Lee, NP  07/18/2024, 8:06 AM  Advanced Heart Failure Team Pager [phone] (M-F; 7a - 5p)  Please contact [facility] for night-coverage after hours (5p -7a ) and weekends on [email or web address]  Patient seen with NP, I formulated the plan and agree with the above note.   Patient diuresed well, it appears, over weekend.  Creatinine 1.43 => 1.53.  Weight trending down.  Co-ox 57% today, he was not started on dobutamine.  CVP 6 on my read today.   General: NAD Neck: No JVD, no thyromegaly or thyroid  nodule.  Lungs: Clear to auscultation bilaterally with normal respiratory effort. CV: Nondisplaced PMI.  Heart regular S1/S2, no S3/S4, 3/6 SEM RUSB with some obscuring of S2.  No peripheral edema.   Abdomen: Soft, nontender, no hepatosplenomegaly, no distention.  Skin: Intact without lesions or rashes.  Neurologic: Alert and oriented x 3.  Psych: Normal affect. Extremities: No clubbing or cyanosis.  HEENT: Normal.   I think patient is close to euvolemic at this point, CVP 6.  Creatinine mildly higher at 1.53. Co-ox stable at 57%, we have not started dobutamine.  - Hold diuretic today, likely start po regimen tomorrow.   He needs to undergo workup for TAVR.  Think he is ready for LHC/RHC today.  Discussed risks/benefits with patient and son today, they agree to  procedure.  Will need to be careful with contrast given creatinine 1.5.  Will also ask structural heart team to see him.   [name] 07/18/2024 8:42 AM

## 2024-07-18 NOTE — Progress Notes (Signed)
 Progress Note   Patient: [name] FMW:[id number] DOB: [birth year] DOA: 07/12/2024     5 DOS: the patient was seen and examined on 07/18/2024   Brief hospital course: [name] was admitted to the hospital with the working diagnosis of heart failure.   83 yo male with the past medical history of hypertension, hyperlipidemia, T2DM, coronary artery disease, CKD stage 3a, BPH, biplar and OSA who presented with dyspnea.  Reported one moth of worsening lower extremity edema, and dyspnea on exertion. Positive 15 lbs weight gain and expanding edema to his scrotum. Symptoms refractive to loop diuretic at home.  At the time of his initial physical examination his blood pressure was 127/76, HR 60, RR 18 and 02 saturation 95% Lungs with bilateral rales with no wheezing, heart with S1 and S2 present and regular with no gallops, rubs or murmurs, abdomen with no distention, positive lower extremity edema.   Na 137, K 4,4 CL 104 bicarbonate 24 glucose 98 bun 35 cr 1,42  BNP 4,159  Wbc 7.4 hgb 10.6 plt 224   Sars covid 19 negative  Influenza negative RSV negative   Chest radiograph with hypoinflation, positive cardiomegaly, bilateral hilar vascular congestion, fluid in the right fissure, bilateral pleural effusions.   EKG 60 bpm, normal axis, qtc 476, right bundle branch block, sinus rhythm with 1st degree AV block with no significant ST segment or T wave changes.   Patient was placed on furosemide for diuresis.  Echocardiogram with reduced LV systolic function and severe aortic stenosis.  10/03 worsening renal function, PICC Line placed SVO 69.3  10/04 continue diuresis. 10/05 volume status has improved.   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 Hypokinetic antero lateral wall, anterior septum, inferior wall, posterior wall,  mid infero septal segment, basal anterior segment, and basal inferoseptal segment.  Grade II diastolic dysfunction, (pseudo normalization EA), interventricular septum is flattened in systole, RV systolic function with moderate reduction, mild RV enlargement, LA and RA with mild dilatation, mild mitral valve regurgitation, severe aortic valve stenosis.   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   Urine output documented 1,700 ml Systolic blood pressure 100 mmHg range.  SV02 57.3   Holding on spironolactone and SGLT 2 inh due to acutely reduced GFR.  Holding loop diuretic, due to improved volume status.  Possible addition of further afterload reduction with ARB or Ace inh when renal function more stable.   Plan for cardiac catheterization right and left today.  Pending TAVR work up.   Acute hypoxemic respiratory failure.  Follow up chest radiograph 10/02 continue with signs of pulmonary edema.  02 saturation today 98  % on room air.   Essential hypertension Continue blood pressure monitoring  Stage 3a chronic kidney disease (HCC) AKI hyponatremia.   Today renal function with serum cr at 1,53 with k at 4.0 and serum bicarbonate at 32 Na 134 and Mg 2.0   Holding on diuretic therapy and follow up renal function in am.   Type 2 diabetes mellitus with hyperlipidemia (HCC) Glucose has been controlled Resume insulin sliding scale.  Continue with statin therapy  Acquired hypothyroidism Continue levothyroxine.   BPH (benign prostatic hyperplasia) No signs of urinary retention   GERD Continue pantoprazole.   Bipolar disorder (HCC) No current medications, follow up as outpatient   Sleep apnea Cpap  Subjective: Patient is feeling better, no dyspnea or chest pain, edema continue to improve, including scrotal edema   Physical Exam: Vitals:   07/17/24 1500 07/17/24 2009 07/18/24 0443 07/18/24 0524  BP: 90/63 105/62 102/65   Pulse: 65 62 63   Resp: 19  19 15    Temp: 98.3 F (36.8 C) 97.8 F (36.6 C) 97.6 F (36.4 C)   TempSrc: Oral Oral Oral   SpO2: 100% 98% 100%   Weight:    68.5 kg  Height:    5' 6 (1.676 m)   Neurology awake and alert ENT with mild pallor  Cardiovascular with S1 and diminished S2 with no gallops or rubs, positive systolic murmur at the bases, crescendo decrescendo  No JVD Respiratory with no rales or rhonchi, no wheezing Abdomen with no distention  Trace lower extremity edema   Data Reviewed:    Family Communication: I spoke with patient's son at the bedside, we talked in detail about patient's condition, plan of care and prognosis and all questions were addressed.   Disposition: Status is: Inpatient Remains inpatient appropriate because: recovering heart failure TAVR work up   Planned Discharge Destination: Home     Author: Elidia Toribio Furnace, MD 07/18/2024 9:47 AM  For on call review [email or web address].

## 2024-07-18 NOTE — H&P (View-Only) (Signed)
 Advanced Heart Failure Rounding Note  Cardiologist: Alm Clay, MD  Chief Complaint: Aortic stenosis Subjective:    Co-ox 57%. CVP 11-12 Diuresed over the weekend, weight overall down 30lbs.  sCr 1.9 >1.6>1.4>1.5  Reports some lightheadedness. Biggest complaint is sinus congestion, has had large volume nasal fluid this morning. Also very concerned about passing in his sleep.   Objective:    Weight Range: 68.5 kg Body mass index is 24.37 kg/m.   Vital Signs:   Temp:  [97.6 F (36.4 C)-98.3 F (36.8 C)] 97.6 F (36.4 C) (10/06 0443) Pulse Rate:  [52-65] 63 (10/06 0443) Resp:  [15-19] 15 (10/06 0443) BP: (90-105)/(56-65) 102/65 (10/06 0443) SpO2:  [97 %-100 %] 100 % (10/06 0443) Weight:  [68.5 kg] 68.5 kg (10/06 0524) Last BM Date :  (PTA)  Weight change: Filed Weights   07/16/24 0427 07/17/24 0342 07/18/24 0524  Weight: 73.6 kg 72.8 kg 68.5 kg   Intake/Output:  Intake/Output Summary (Last 24 hours) at 07/18/2024 0806 Last data filed at 07/18/2024 0441 Gross per 24 hour  Intake 480 ml  Output 1700 ml  Net -1220 ml    Physical Exam    General: Elderly appearing. No distress on RA Cardiac: S1 and S2 present. RUSB 4/6 systolic murmur Resp: Lung sounds coarse and equal B/L Abdomen: Soft, non-tender, non-distended.  Extremities: Warm and dry.  No peripheral edema.  Neuro: Appears to not completely understand situation  Telemetry   ST 50-60s (personally reviewed)  Labs    CBC No results for input(s): WBC, NEUTROABS, HGB, HCT, MCV, PLT in the last 72 hours. Basic Metabolic Panel Recent Labs    89/95/74 0500 07/17/24 0820 07/18/24 0500  NA 135 135 134*  K 4.8 4.1 4.0  CL 94* 88* 92*  CO2 32 36* 32  GLUCOSE 121* 105* 80  BUN 45* 41* 45*  CREATININE 1.63* 1.43* 1.53*  CALCIUM 9.0 9.0 9.0  MG 1.9  --  2.0   BNP (last 3 results) Recent Labs    07/12/24 1530  BNP 4,159.6*   Hemoglobin A1C Recent Labs    07/16/24 1407  HGBA1C  6.2*   Medications:    Scheduled Medications:  atorvastatin  80 mg Oral Daily   Chlorhexidine Gluconate Cloth  6 each Topical Daily   enoxaparin  (LOVENOX ) injection  40 mg Subcutaneous Q24H   furosemide  80 mg Intravenous BID   insulin aspart  0-9 Units Subcutaneous TID WC   levothyroxine  75 mcg Oral Q0600   sodium chloride  flush  10-40 mL Intracatheter Q12H   sodium chloride  flush  3 mL Intravenous Q12H   sodium chloride  flush  3 mL Intravenous Q12H   Infusions:  sodium chloride       PRN Medications: sodium chloride , acetaminophen  **OR** acetaminophen , polyethylene glycol, sodium chloride , sodium chloride  flush, sodium chloride  flush  Patient Profile   83 y/o male w/ HTN, HLD, Type 2 DM, CKD IIIa, hypothyroidism, OSA and bioplar disorder but no prior cardiac history, who presented w/ new HF symptoms and marked volume overload. Echo w/ biventricular failure and severe AS. AHF team consulted for low output HF.   Assessment/Plan   1. 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 Interventricular septum flattened in systole, consistent with  right ventricular pressure overload, moderate RV dysfunction with mild RV enlargement. No prior study for comparison  - etiology likely 2/2 valvular heart disease +/- possible ischemic CM. No h/o CP. Troponins not cycled on admission - initially with concern for low output with AKI w/ attempts at diuresis. Co-ox have been marginal, but ok.  - ultimately needs TAVR, will diurese then start work up - plan for [facility] - co-ox 57. CVP 6 - diuresed total 15L, 30lbs. Hold Lasix today pre-cath.  - no ARB/ARNi, MRA nor dig yet w/ elevated sCr - no ? blocker w/ suspected  low output - no hydral/nitrates in setting of severe AS and soft BP    2. Aortic Stenosis - severe by TTE, AVA by VTI measured at 72 cm, mean gradient 33 mmHg - given age, TAVR likely best suitable option. Will need structural heart team consultation  - note, also w/ baseline RBBB    3. AKI on CKD IIIa - Scr 1.3 on admit, down to 1.6>1.4>1.5 today - suspect cardiorenal   We are aiming for TAVR, will need to diurese first and stabilize creatinine to allow TAVR workup. Initiation of dobutamine may help with this. 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    Length of Stay: 5  David Lee, NP  07/18/2024, 8:06 AM  Advanced Heart Failure Team Pager [phone] (M-F; 7a - 5p)  Please contact [facility] for night-coverage after hours (5p -7a ) and weekends on [email or web address]  Patient seen with NP, I formulated the plan and agree with the above note.   Patient diuresed well, it appears, over weekend.  Creatinine 1.43 => 1.53.  Weight trending down.  Co-ox 57% today, he was not started on dobutamine.  CVP 6 on my read today.   General: NAD Neck: No JVD, no thyromegaly or thyroid  nodule.  Lungs: Clear to auscultation bilaterally with normal respiratory effort. CV: Nondisplaced PMI.  Heart regular S1/S2, no S3/S4, 3/6 SEM RUSB with some obscuring of S2.  No peripheral edema.   Abdomen: Soft, nontender, no hepatosplenomegaly, no distention.  Skin: Intact without lesions or rashes.  Neurologic: Alert and oriented x 3.  Psych: Normal affect. Extremities: No clubbing or cyanosis.  HEENT: Normal.   I think patient is close to euvolemic at this point, CVP 6.  Creatinine mildly higher at 1.53. Co-ox stable at 57%, we have not started dobutamine.  - Hold diuretic today, likely start po regimen tomorrow.   He needs to undergo workup for TAVR.  Think he is ready for LHC/RHC today.  Discussed risks/benefits with patient and son today, they agree to  procedure.  Will need to be careful with contrast given creatinine 1.5.  Will also ask structural heart team to see him.   [name] 07/18/2024 8:42 AM

## 2024-07-19 ENCOUNTER — Encounter (HOSPITAL_COMMUNITY): Payer: Self-pay | Admitting: Cardiology

## 2024-07-19 ENCOUNTER — Inpatient Hospital Stay (HOSPITAL_COMMUNITY)

## 2024-07-19 DIAGNOSIS — I5082 Biventricular heart failure: Secondary | ICD-10-CM | POA: Diagnosis not present

## 2024-07-19 DIAGNOSIS — N1831 Chronic kidney disease, stage 3a: Secondary | ICD-10-CM | POA: Diagnosis not present

## 2024-07-19 DIAGNOSIS — I5023 Acute on chronic systolic (congestive) heart failure: Secondary | ICD-10-CM | POA: Diagnosis not present

## 2024-07-19 DIAGNOSIS — N179 Acute kidney failure, unspecified: Secondary | ICD-10-CM | POA: Diagnosis not present

## 2024-07-19 DIAGNOSIS — E1169 Type 2 diabetes mellitus with other specified complication: Secondary | ICD-10-CM | POA: Diagnosis not present

## 2024-07-19 DIAGNOSIS — I350 Nonrheumatic aortic (valve) stenosis: Secondary | ICD-10-CM | POA: Diagnosis not present

## 2024-07-19 DIAGNOSIS — I2510 Atherosclerotic heart disease of native coronary artery without angina pectoris: Secondary | ICD-10-CM

## 2024-07-19 DIAGNOSIS — I5021 Acute systolic (congestive) heart failure: Secondary | ICD-10-CM | POA: Diagnosis not present

## 2024-07-19 DIAGNOSIS — I10 Essential (primary) hypertension: Secondary | ICD-10-CM | POA: Diagnosis not present

## 2024-07-19 LAB — CBC
HCT: 34.7 % — ABNORMAL LOW (ref 39.0–52.0)
Hemoglobin: 11.2 g/dL — ABNORMAL LOW (ref 13.0–17.0)
MCH: 28.7 pg (ref 26.0–34.0)
MCHC: 32.3 g/dL (ref 30.0–36.0)
MCV: 89 fL (ref 80.0–100.0)
Platelets: 223 K/uL (ref 150–400)
RBC: 3.9 MIL/uL — ABNORMAL LOW (ref 4.22–5.81)
RDW: 15.1 % (ref 11.5–15.5)
WBC: 6.3 K/uL (ref 4.0–10.5)
nRBC: 0 % (ref 0.0–0.2)

## 2024-07-19 LAB — GLUCOSE, CAPILLARY
Glucose-Capillary: 116 mg/dL — ABNORMAL HIGH (ref 70–99)
Glucose-Capillary: 98 mg/dL (ref 70–99)
Glucose-Capillary: 104 mg/dL — ABNORMAL HIGH (ref 70–99)
Glucose-Capillary: 158 mg/dL — ABNORMAL HIGH (ref 70–99)

## 2024-07-19 LAB — BASIC METABOLIC PANEL WITH GFR
Anion gap: 8 (ref 5–15)
BUN: 50 mg/dL — ABNORMAL HIGH (ref 8–23)
CO2: 32 mmol/L (ref 22–32)
Calcium: 8.7 mg/dL — ABNORMAL LOW (ref 8.9–10.3)
Chloride: 93 mmol/L — ABNORMAL LOW (ref 98–111)
Creatinine, Ser: 1.61 mg/dL — ABNORMAL HIGH (ref 0.61–1.24)
GFR, Estimated: 42 mL/min — ABNORMAL LOW (ref >60)
Glucose, Bld: 193 mg/dL — ABNORMAL HIGH (ref 70–99)
Potassium: 4.6 mmol/L (ref 3.5–5.1)
Sodium: 133 mmol/L — ABNORMAL LOW (ref 135–145)

## 2024-07-19 LAB — MAGNESIUM: Magnesium: 2 mg/dL (ref 1.7–2.4)

## 2024-07-19 LAB — COOXEMETRY PANEL
Carboxyhemoglobin: 2 % — ABNORMAL HIGH (ref 0.5–1.5)
Methemoglobin: <0.7 % (ref 0.0–1.5)
O2 Saturation: 69.8 %
Total hemoglobin: 11.8 g/dL — ABNORMAL LOW (ref 12.0–16.0)

## 2024-07-19 MED ORDER — DAPAGLIFLOZIN PROPANEDIOL 10 MG PO TABS
10.0000 mg | ORAL_TABLET | Freq: Every day | ORAL | Status: DC
  Administered 2024-07-19 – 2024-07-21 (×3): 10 mg via ORAL
  Filled 2024-07-19 (×3): qty 1

## 2024-07-19 MED ORDER — ASPIRIN 81 MG PO CHEW
81.0000 mg | CHEWABLE_TABLET | Freq: Every day | ORAL | Status: DC
  Administered 2024-07-19 – 2024-07-21 (×3): 81 mg via ORAL
  Filled 2024-07-19 (×3): qty 1

## 2024-07-19 MED FILL — Verapamil HCl IV Soln 2.5 MG/ML: INTRAVENOUS | Qty: 2 | Status: AC

## 2024-07-19 NOTE — Assessment & Plan Note (Signed)
 Proximal Cx lesion 100% stenosed Mid LAD lesion 90% stenosed.   Continue medical therapy with aspirin and statin Will need revascularization in the near future.  Currently with no chest pain or angina.

## 2024-07-19 NOTE — Progress Notes (Addendum)
 Advanced Heart Failure Rounding Note  Cardiologist: Alm Clay, MD  Chief Complaint: Aortic stenosis Subjective:    Co-ox 70%, Wt overall down 32 lb w/ diuresis, CVP 8  SCr 1.9 >1.6>1.4>1.5>1.6   Feels ok today. No chest pain or resting dyspnea.   LHC  Occluded Lcx with right to left collaterals Severe LAD disease  RHC  Mildly elevated filling pressures Normal cardiac index by assumed Fick  Right     RA Mean  mmHg 7    RA A-Wave  mmHg 10    RA V-Wave  mmHg 9  Pulmonary     PA  mmHg 69/23 (38)    PCW Mean  mmHg 18.0    PCW A-Wave  mmHg 20.0    PCW V-Wave  mmHg 26.0    PAPi   6.6    Saturations Phases Resting    PA  % 60    Arterial  % 96   Fick Cardiac Output 4.67 L/min  Fick Cardiac Output Index 2.56 (L/min)/BSA     Objective:    Weight Range: 68.3 kg Body mass index is 24.3 kg/m.   Vital Signs:   Temp:  [97.8 F (36.6 C)-98.4 F (36.9 C)] 97.8 F (36.6 C) (10/07 0725) Pulse Rate:  [0-64] 64 (10/07 0725) Resp:  [12-23] 23 (10/07 0725) BP: (76-129)/(43-99) 117/63 (10/07 0725) SpO2:  [86 %-100 %] 99 % (10/07 0725) Weight:  [68.3 kg] 68.3 kg (10/07 0500) Last BM Date : 07/18/24  Weight change: Filed Weights   07/17/24 0342 07/18/24 0524 07/19/24 0500  Weight: 72.8 kg 68.5 kg 68.3 kg   Intake/Output:  Intake/Output Summary (Last 24 hours) at 07/19/2024 0736 Last data filed at 07/19/2024 0730 Gross per 24 hour  Intake 740 ml  Output 728 ml  Net 12 ml    Physical Exam    CVP 8  GENERAL: NAD Lungs- clear  CARDIAC:  JVP: 8 cm          Normal rate with regular rhythm. 3/6 SEM, trace b/l LEE ABDOMEN: Soft, non-tender, non-distended.  EXTREMITIES: Warm and well perfused.  NEUROLOGIC: No obvious FND   Telemetry   NSR 60s (personally reviewed)  Labs    CBC Recent Labs    07/18/24 1252 07/19/24 0430  WBC  --  6.3  HGB 16.0 11.2*  HCT 47.0 34.7*  MCV  --  89.0  PLT  --  223   Basic Metabolic Panel Recent Labs     07/18/24 0500 07/18/24 1135 07/18/24 1252 07/19/24 0430  NA 134*   < > 133* 133*  K 4.0   < > 4.4 4.6  CL 92*  --   --  93*  CO2 32  --   --  32  GLUCOSE 80  --   --  193*  BUN 45*  --   --  50*  CREATININE 1.53*  --   --  1.61*  CALCIUM 9.0  --   --  8.7*  MG 2.0  --   --  2.0   < > = values in this interval not displayed.   BNP (last 3 results) Recent Labs    07/12/24 1530  BNP 4,159.6*   Hemoglobin A1C Recent Labs    07/16/24 1407  HGBA1C 6.2*   Medications:    Scheduled Medications:  atorvastatin  80 mg Oral Daily   Chlorhexidine Gluconate Cloth  6 each Topical Daily   enoxaparin  (LOVENOX ) injection  40 mg Subcutaneous Q24H  insulin aspart  0-9 Units Subcutaneous TID WC   levothyroxine  75 mcg Oral Q0600   sodium chloride  flush  10-40 mL Intracatheter Q12H   sodium chloride  flush  3 mL Intravenous Q12H   Infusions:    PRN Medications: acetaminophen  **OR** acetaminophen , polyethylene glycol, sodium chloride , sodium chloride  flush  Patient Profile   83 y/o male w/ HTN, HLD, Type 2 DM, CKD IIIa, hypothyroidism, OSA and bioplar disorder but no prior cardiac history, who presented w/ new HF symptoms and marked volume overload. Echo w/ biventricular failure and severe AS. AHF team consulted for low output HF.   Assessment/Plan   1. 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 Interventricular septum flattened in systole, consistent with right ventricular pressure overload, moderate RV dysfunction with mild RV enlargement. 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 Co-ox have been marginal, but ok.  - RHC after diuresis showed mildly elevated filling pressures and normal CI by FICK (RA 7, PCW 18, CI 2.6) - LHC occluded Lcx with right to left collaterals and severe LAD disease  - Awaiting TAVR/PCI - Co-ox 70%, Wt overall down 32 lb w/ diuresis, CVP 8 - no ARB/ARNi, MRA nor dig yet w/ elevated sCr - no hydral/nitrates in setting of severe AS and soft BP  - start PO diuretics probably tomorrow, lasix 40 mg daily    2. Aortic Stenosis - severe by TTE, AVA by VTI measured at 72 cm, mean gradient 33 mmHg - given age, TAVR likely best suitable option. Will need structural heart team consultation  - note, also w/ baseline RBBB will be at risk of needing PPM w/ TAVR   3. CAD - LHC w/ severe LAD disease and occluded LCx w/ R>>L collaterals - will need staged PCI. D/w IC  - CP free - continue atorva 80 mg - add ASA 81 mg    4. AKI on CKD IIIa - Scr 1.3 on admit, down to 1.6>1.4>1.5>1.6 today - monitor w/ restart of diuretics   Length of Stay: [address] PA-C  07/19/2024, 7:36 AM  Advanced Heart Failure Team Pager [phone] (M-F; 7a - 5p)  Please contact [facility] for night-coverage after hours (5p -7a ) and weekends on [email or web address]  Patient seen with PA, I formulated the plan and agree with the above note.   SBP 90s-110s, creatinine 1.53 => 1.61.  I measure CVP at 7. Co-ox 70%.    No dyspnea or chest pain.   General: NAD Neck: JVP 7-8 cm, no thyromegaly or thyroid  nodule.  Lungs: Clear to auscultation bilaterally with normal respiratory effort. CV: Nondisplaced PMI.  Heart regular S1/S2, no S3/S4, 3/6 SEM RUSB with muffled S2.  No peripheral edema.   Abdomen: Soft, nontender, no hepatosplenomegaly, no distention.  Skin: Intact without lesions or rashes.  Neurologic: Alert and oriented x 3.  Psych: Normal affect. Extremities: No clubbing or cyanosis.  HEENT: Normal.   Creatinine mildly higher today, he  is not significantly volume overloaded with CVP 7. Creatinine up a bit to 1.6.  - Start Farxiga 10 mg daily - Start Lasix 40  mg po daily tomorrow if creatinine stable.   He will need TAVR and also PCI to 90% mLAD stenosis.  Discussed with structural heart team, will plan for TAVR next Tuesday, PCI to mid LAD will be after this.  Will need pre-TAVR scans, would give creatinine today to settle out then do tomorrow.   He may be able to go home then come back for TAVR.  Mobilize.   [name] 07/19/2024 8:15 AM

## 2024-07-19 NOTE — Progress Notes (Addendum)
 Progress Note   Patient: [name] FMW:[id number] DOB: 11-22-1940 DOA: 07/12/2024     6 DOS: the patient was seen and examined on 07/19/2024   Brief hospital course: Mr. Risinger was admitted to the hospital with the working diagnosis of heart failure.   83 yo male with the past medical history of hypertension, hyperlipidemia, T2DM, coronary artery disease, CKD stage 3a, BPH, biplar and OSA who presented with dyspnea.  Reported one moth of worsening lower extremity edema, and dyspnea on exertion. Positive 15 lbs weight gain and expanding edema to his scrotum. Symptoms refractive to loop diuretic at home.  At the time of his initial physical examination his blood pressure was 127/76, HR 60, RR 18 and 02 saturation 95% Lungs with bilateral rales with no wheezing, heart with S1 and S2 present and regular with no gallops, rubs or murmurs, abdomen with no distention, positive lower extremity edema.   Na 137, K 4,4 CL 104 bicarbonate 24 glucose 98 bun 35 cr 1,42  BNP 4,159  Wbc 7.4 hgb 10.6 plt 224   Sars covid 19 negative  Influenza negative RSV negative   Chest radiograph with hypoinflation, positive cardiomegaly, bilateral hilar vascular congestion, fluid in the right fissure, bilateral pleural effusions.   EKG 60 bpm, normal axis, qtc 476, right bundle branch block, sinus rhythm with 1st degree AV block with no significant ST segment or T wave changes.   Patient was placed on furosemide for diuresis.  Echocardiogram with reduced LV systolic function and severe aortic stenosis.  10/03 worsening renal function, PICC Line placed SVO 69.3  10/04 continue diuresis. 10/05 volume status has improved.   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 Hypokinetic antero lateral wall, anterior septum, inferior wall, posterior wall,  mid infero septal segment, basal anterior segment, and basal inferoseptal segment.  Grade II diastolic dysfunction, (pseudo normalization EA), interventricular septum is flattened in systole, RV systolic function with moderate reduction, mild RV enlargement, LA and RA with mild dilatation, mild mitral valve regurgitation, severe aortic valve stenosis.   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   10/06 cardiac catheterization  RA 8  RV 65/4 mean 8  PA 65/23 mean 38  PCWP 18  Cardiac output 4,67 and index 2.56 (Fick)   Urine output documented 725 ml Systolic blood pressure 100 mmHg range.  SV02 57.3   Resumed SGLT 2 inh, continue to hold on spironolactone and loop diuretic until renal function more stable.   Continue TAVR work up.   Acute hypoxemic respiratory failure.  Follow up chest radiograph 10/02 continue with signs of pulmonary edema.  02 saturation today 94% on room air.   Telemetry with first degree AV block and intermittent AV block 2nd degree type 1.   Essential hypertension Continue blood pressure monitoring  Coronary artery disease Proximal Cx lesion 100% stenosed Mid LAD lesion 90% stenosed.   Continue medical therapy with aspirin and statin Will need revascularization in the near future.  Currently with no chest pain or angina.   Stage 3a chronic kidney disease (HCC) AKI hyponatremia.   Worsening renal function with serum cr at 1,61 with K at 4.6 and serum bicarbonate at 32 Na 133 and Mg 2.0    Holding on loop diuretic therapy and follow up renal function in am.   Type 2 diabetes mellitus with hyperlipidemia (HCC) Glucose has  been controlled Resume insulin sliding scale.  Continue with statin therapy  Acquired hypothyroidism Continue levothyroxine.   BPH (benign prostatic hyperplasia) No signs of urinary retention   GERD Continue pantoprazole.   Bipolar disorder (HCC) No current medications, follow up as outpatient   Sleep  apnea Cpap       Subjective: Patient is having no chest pain or dyspnea, no PND, orthopnea or lower extremity edema   Physical Exam: Vitals:   07/19/24 0500 07/19/24 0725 07/19/24 0800 07/19/24 1145  BP:  117/63 117/63 106/62  Pulse:  64 61 (!) 53  Resp:  (!) 23    Temp:  97.8 F (36.6 C) 98.1 F (36.7 C) 97.7 F (36.5 C)  TempSrc:  Oral Oral Oral  SpO2:  99% 95% 94%  Weight: 68.3 kg     Height:       Neurology awake and alert ENT with mild pallor Cardiovascular with S1 and diminished S2, with positive systolic murmur crescendo decrescendo radiated to the carotids Respiratory with no rales or wheezing, no rhonchi  Abdomen with no distention  No lower extremity edema  Data Reviewed:    Family Communication: no family at the bedside   Disposition: Status is: Inpatient Remains inpatient appropriate because: TAVR workup   Planned Discharge Destination: Home     Author: Elidia Toribio Furnace, MD 07/19/2024 12:18 PM  For on call review [email or web address].

## 2024-07-19 NOTE — Care Management Important Message (Signed)
 Important Message  Patient Details  Name: [name] MRN: [id number] Date of Birth: 02/29/1941   Important Message Given:  Yes - Medicare IM     [name] 07/19/2024, 11:16 AM

## 2024-07-19 NOTE — Progress Notes (Signed)
 Occupational Therapy Treatment Patient Details Name: [name] MRN: [id number] DOB: 04-24-1941 Today's Date: 07/19/2024   History of present illness Pt is a 83 y.o male admitted 9/30 for acute CHF. Chest x-ray showed vascular exacerbation. 10/6 cath and coronary angiogram. PMH: HTN, HLD, DM, hypothyroidism, CAD, CKD, bipolar dx, OSA   OT comments  Pt presented in bed agreeable to get OOB but did nnot want to ambulate as reported they just did. Pt on RA 88-96% with cues on breathing techniques. Pt was able to step pivot to chair with Cga and simulated dressing. At this time recommendation for 4WW, pacing and IS use. At this time downgraded to OP PT at follow up and decrease in frequency for Acute Occupational Therapy.       If plan is discharge home, recommend the following:  Assistance with cooking/housework;Assist for transportation   Equipment Recommendations   [phone])    Recommendations for Other Services      Precautions / Restrictions Precautions Precautions: Fall Recall of Precautions/Restrictions: Intact Precaution/Restrictions Comments: not on O2 baseline Restrictions Weight Bearing Restrictions Per Provider Order: No       Mobility Bed Mobility Overal bed mobility: Modified Independent             General bed mobility comments: HOB elevated but assist    Transfers Overall transfer level: Needs assistance                 General transfer comment: given hand held assist and did not want walker going to chair     Balance Overall balance assessment: Needs assistance Sitting-balance support: Feet supported Sitting balance-Leahy Scale: Good     Standing balance support: No upper extremity supported Standing balance-Leahy Scale: Fair Standing balance comment: only wanted to take a few steps                           ADL either performed or assessed with clinical judgement   ADL Overall ADL's : Needs assistance/impaired Eating/Feeding: Set  up;Sitting   Grooming: Contact guard assist;Standing   Upper Body Bathing: Set up;Sitting   Lower Body Bathing: Contact guard assist;Sit to/from stand   Upper Body Dressing : Set up;Sitting   Lower Body Dressing: Contact guard assist;Sit to/from stand   Toilet Transfer: Contact guard assist   Toileting- Clothing Manipulation and Hygiene: Contact guard assist;Sit to/from stand              Extremity/Trunk Assessment Upper Extremity Assessment Upper Extremity Assessment: Generalized weakness   Lower Extremity Assessment Lower Extremity Assessment: Defer to PT evaluation        Vision   Vision Assessment?: No apparent visual deficits   Perception     Praxis     Communication Communication Communication: Impaired Factors Affecting Communication: Hearing impaired   Cognition Arousal: Alert Behavior During Therapy: WFL for tasks assessed/performed, Flat affect Cognition: No apparent impairments                               Following commands: Intact        Cueing   Cueing Techniques: Verbal cues, Gestural cues  Exercises Other Exercises Other Exercises: IS    Shoulder Instructions       General Comments on RA went down to 88% but able to go back up to 96%    Pertinent Vitals/ Pain       Pain Assessment Pain  Assessment: No/denies pain  Home Living                                          Prior Functioning/Environment              Frequency  Min 2X/week        Progress Toward Goals  OT Goals(current goals can now be found in the care plan section)  Progress towards OT goals: Progressing toward goals  Acute Rehab OT Goals Patient Stated Goal: to get better OT Goal Formulation: With patient Time For Goal Achievement: 07/28/24 Potential to Achieve Goals: Good ADL Goals Pt Will Perform Grooming: with modified independence;standing Pt Will Perform Lower Body Dressing: with modified independence;sit  to/from stand Pt Will Transfer to Toilet: with modified independence;ambulating;regular height toilet Pt Will Perform Toileting - Clothing Manipulation and hygiene: with modified independence;sit to/from stand;sitting/lateral leans Pt Will Perform Tub/Shower Transfer: with modified independence;Tub transfer;shower seat  Plan      Co-evaluation                 AM-PAC OT 6 Clicks Daily Activity     Outcome Measure   Help from another person eating meals?: None Help from another person taking care of personal grooming?: None Help from another person toileting, which includes using toliet, bedpan, or urinal?: A Little Help from another person bathing (including washing, rinsing, drying)?: A Little Help from another person to put on and taking off regular upper body clothing?: None Help from another person to put on and taking off regular lower body clothing?: None 6 Click Score: 22    End of Session    OT Visit Diagnosis: Unsteadiness on feet (R26.81);Other abnormalities of gait and mobility (R26.89);Muscle weakness (generalized) (M62.81)   Activity Tolerance Patient tolerated treatment well   Patient Left in chair;with call bell/phone within reach;with chair alarm set   Nurse Communication Mobility status        Time: 8987-8955 OT Time Calculation (min): 32 min  Charges: OT General Charges $OT Visit: 1 Visit OT Treatments $Self Care/Home Management : 23-37 mins  [name] OTR/L  Acute Rehab Services  [phone] office number   [name] 07/19/2024, 11:23 AM

## 2024-07-19 NOTE — Progress Notes (Signed)
 PROGRESS NOTE    Horacio Werth  FMW:[id number] DOB: 1941/01/27 DOA: 07/12/2024 PCP: Nichole Senior, MD  83/M w hypertension, hyperlipidemia, T2DM, coronary artery disease, CKD stage 3a, BPH, biplar and OSA who presented with dyspnea.  Reported one month of worsening lower extremity edema, and dyspnea on exertion. Positive 15 lbs weight gain and expanding edema to his scrotum. Symptoms refractive to loop diuretic at home.  In ED, volume overloaded,  cr 1,42 , BNP 4,159  CXR w hypoinflation, positive cardiomegaly, bilateral hilar vascular congestion, fluid in the right fissure, bilateral pleural effusions.  -Patient was placed on furosemide for diuresis.  -Echocardiogram with reduced LV systolic function and severe aortic stenosis.  10/03 worsening renal function, PICC Line placed SVO 69.3  10/04 continue diuresis. 10/05 volume status has improved.     Subjective:   Assessment and Plan:  Acute on chronic systolic CHF, BiV Failure -Echow EF 30 to 35%, global hypokinesis,Akinetic mid and distal anterior wall and entire apex.Hypokinetic antero lateral wall, anterior septum, inferior wall, posterior wall, mid infero septal segment, basal anterior segment, and basal inferoseptal segment. Grade II diastolic dysfunction, RV systolic function with moderate reduction, severe aortic valve stenosis.  -10/06 cardiac catheterization RA 8 RV 65/4 mean 8 PA 65/23 mean 38 PCWP 18 Cardiac output 4,67 and index 2.56 (Fick)  -Resumed SGLT 2 inh, continue to hold on spironolactone and loop diuretic until renal function more stable.   Severe Aortic stenosis Continue TAVR work up.   Acute hypoxemic respiratory failure.  Follow up chest radiograph 10/02 continue with signs of pulmonary edema.  02 saturation today 94% on room air.  Telemetry with first degree AV block, intermittent AV block 2nd degree type 1  Essential hypertension -meds as above  Coronary artery disease Proximal Cx lesion 100%  stenosed Mid LAD lesion 90% stenosed.  Continue medical therapy with aspirin and statin Will need revascularization in the near future.  Currently with no chest pain or angina.   Stage 3a chronic kidney disease (HCC) AKI hyponatremia.  -Holding on loop diuretic therapy and follow up renal function in am.   Type 2 diabetes mellitus  Glucose has been controlled Resume insulin sliding scale.   Acquired hypothyroidism Continue levothyroxine.   BPH (benign prostatic hyperplasia) No signs of urinary retention   GERD Continue pantoprazole.   Bipolar disorder (HCC) No current medications, follow up as outpatient   Sleep apnea Cpap    DVT prophylaxis: lovenox  Code Status: full code Family Communication: Disposition Plan:   Consultants:    Procedures:   Antimicrobials:    Objective: Vitals:   07/19/24 1200 07/19/24 1300 07/19/24 1400 07/19/24 1600  BP:  96/67 90/79 109/64  Pulse: 62 (!) 53 68 (!) 47  Resp: 12 14 16  (!) 8  Temp:    97.9 F (36.6 C)  TempSrc:    Oral  SpO2: 92% 93% (!) 87% 95%  Weight:      Height:        Intake/Output Summary (Last 24 hours) at 07/19/2024 1750 Last data filed at 07/19/2024 1405 Gross per 24 hour  Intake 720 ml  Output 775 ml  Net -55 ml   Filed Weights   07/17/24 0342 07/18/24 0524 07/19/24 0500  Weight: 72.8 kg 68.5 kg 68.3 kg    Examination:   Data Reviewed:   CBC: Recent Labs  Lab 07/13/24 0341 07/18/24 1135 07/18/24 1251 07/18/24 1252 07/19/24 0430  WBC 5.5  --   --   --  6.3  HGB  10.6* 11.9*  11.6* 15.6 16.0 11.2*  HCT 32.7* 35.0*  34.0* 46.0 47.0 34.7*  MCV 89.8  --   --   --  89.0  PLT 211  --   --   --  223   Basic Metabolic Panel: Recent Labs  Lab 07/14/24 0327 07/15/24 0331 07/16/24 0500 07/17/24 0820 07/18/24 0500 07/18/24 1135 07/18/24 1251 07/18/24 1252 07/19/24 0430  NA 136 137 135 135 134* 134*  133* 134* 133* 133*  K 4.4 4.8 4.8 4.1 4.0 4.0  4.0 4.3 4.4 4.6  CL 100 94* 94*  88* 92*  --   --   --  93*  CO2 25 30 32 36* 32  --   --   --  32  GLUCOSE 126* 158* 121* 105* 80  --   --   --  193*  BUN 40* 45* 45* 41* 45*  --   --   --  50*  CREATININE 1.69* 1.91* 1.63* 1.43* 1.53*  --   --   --  1.61*  CALCIUM 9.0 9.1 9.0 9.0 9.0  --   --   --  8.7*  MG 1.7 2.0 1.9  --  2.0  --   --   --  2.0   GFR: Estimated Creatinine Clearance: 30.8 mL/min (A) (by C-G formula based on SCr of 1.61 mg/dL (H)). Liver Function Tests: Recent Labs  Lab 07/13/24 0341  AST 24  ALT 29  ALKPHOS 83  BILITOT 0.8  PROT 5.7*  ALBUMIN 2.9*   No results for input(s): LIPASE, AMYLASE in the last 168 hours. No results for input(s): AMMONIA in the last 168 hours. Coagulation Profile: No results for input(s): INR, PROTIME in the last 168 hours. Cardiac Enzymes: No results for input(s): CKTOTAL, CKMB, CKMBINDEX, TROPONINI in the last 168 hours. BNP (last 3 results) No results for input(s): PROBNP in the last 8760 hours. HbA1C: No results for input(s): HGBA1C in the last 72 hours. CBG: Recent Labs  Lab 07/18/24 1645 07/18/24 2118 07/19/24 0621 07/19/24 1207 07/19/24 1630  GLUCAP 146* 134* 116* 98 104*   Lipid Profile: No results for input(s): CHOL, HDL, LDLCALC, TRIG, CHOLHDL, LDLDIRECT in the last 72 hours. Thyroid  Function Tests: No results for input(s): TSH, T4TOTAL, FREET4, T3FREE, THYROIDAB in the last 72 hours. Anemia Panel: No results for input(s): VITAMINB12, FOLATE, FERRITIN, TIBC, IRON, RETICCTPCT in the last 72 hours. Urine analysis:    Component Value Date/Time   COLORURINE YELLOW 02/08/2022 2352   APPEARANCEUR CLEAR 02/08/2022 2352   LABSPEC 1.008 02/08/2022 2352   PHURINE 5.0 02/08/2022 2352   GLUCOSEU NEGATIVE 02/08/2022 2352   HGBUR SMALL (A) 02/08/2022 2352   BILIRUBINUR NEGATIVE 02/08/2022 2352   KETONESUR NEGATIVE 02/08/2022 2352   PROTEINUR 30 (A) 02/08/2022 2352   NITRITE NEGATIVE 02/08/2022  2352   LEUKOCYTESUR NEGATIVE 02/08/2022 2352   Sepsis Labs: @LABRCNTIP (procalcitonin:4,lacticidven:4)  ) Recent Results (from the past 240 hours)  Resp panel by RT-PCR (RSV, Flu A&B, Covid) Anterior Nasal Swab     Status: None   Collection Time: 07/12/24  3:23 PM   Specimen: Anterior Nasal Swab  Result Value Ref Range Status   SARS Coronavirus 2 by RT PCR NEGATIVE NEGATIVE Final   Influenza A by PCR NEGATIVE NEGATIVE Final   Influenza B by PCR NEGATIVE NEGATIVE Final    Comment: (NOTE) The Xpert Xpress SARS-CoV-2/FLU/RSV plus assay is intended as an aid in the diagnosis of influenza from Nasopharyngeal swab specimens and should not  be used as a sole basis for treatment. Nasal washings and aspirates are unacceptable for Xpert Xpress SARS-CoV-2/FLU/RSV testing.  Fact Sheet for Patients: BloggerCourse.com  Fact Sheet for Healthcare Providers: SeriousBroker.it  This test is not yet approved or cleared by the [place]  FDA and has been authorized for detection and/or diagnosis of SARS-CoV-2 by FDA under an Emergency Use Authorization (EUA). This EUA will remain in effect (meaning this test can be used) for the duration of the COVID-19 declaration under Section 564(b)(1) of the Act, 21 U.S.C. section 360bbb-3(b)(1), unless the authorization is terminated or revoked.     Resp Syncytial Virus by PCR NEGATIVE NEGATIVE Final    Comment: (NOTE) Fact Sheet for Patients: BloggerCourse.com  Fact Sheet for Healthcare Providers: SeriousBroker.it  This test is not yet approved or cleared by the [place]  FDA and has been authorized for detection and/or diagnosis of SARS-CoV-2 by FDA under an Emergency Use Authorization (EUA). This EUA will remain in effect (meaning this test can be used) for the duration of the COVID-19 declaration under Section 564(b)(1) of the Act, 21  U.S.C. section 360bbb-3(b)(1), unless the authorization is terminated or revoked.  Performed at [facility], [address], [place], [place] [address]      Radiology Studies: CARDIAC CATHETERIZATION Result Date: 07/18/2024   Prox Cx lesion is 100% stenosed.   Mid LAD lesion is 90% stenosed. 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 Discussed with Dr. Mady, would plan for ideally staged PCI prior to TAVR Mildly elevated filling pressures Normal cardiac index by assumed Fick RECOMMENDATIONS: Discuss in structural conference     Scheduled Meds:  aspirin  81 mg Oral Daily   atorvastatin  80 mg Oral Daily   Chlorhexidine Gluconate Cloth  6 each Topical Daily   dapagliflozin propanediol  10 mg Oral Daily   enoxaparin  (LOVENOX ) injection  40 mg Subcutaneous Q24H   insulin aspart  0-9 Units Subcutaneous TID WC   levothyroxine  75 mcg Oral Q0600   sodium chloride  flush  10-40 mL Intracatheter Q12H   sodium chloride  flush  3 mL Intravenous Q12H   Continuous Infusions:   LOS: 6 days    Time spent:    Sigurd Pac, MD [facility]   07/19/2024, 5:50 PM

## 2024-07-19 NOTE — Progress Notes (Signed)
 Physical Therapy Treatment Patient Details Name: [name] MRN: [id number] DOB: [birth year] Today's Date: 07/19/2024   History of Present Illness Pt is a 83 y.o male admitted 9/30 for acute CHF. Chest x-ray showed vascular exacerbation. 10/6 cath and coronary angiogram. PMH: HTN, HLD, DM, hypothyroidism, CAD, CKD, bipolar dx, OSA    PT Comments  Pt received in chair, alert and tangential, agreeable to limited therapy session at bedside with encouragement. Pt needing up to minA for stair negotiation, but only CGA with single rail, pt unsure if son has installed rail for his stairs yet. Pt self-limiting due to fatigue after mobilizing in hallway with nursing student in AM and working with OT. Pt instructed on seated LE exercises and transfer safety, and use of DME for energy conservation in hospital and post-acute and to reduce risk of falls. Pt continues to benefit from PT services to progress toward functional mobility goals, continue to recommend rollator and HHPT.     If plan is discharge home, recommend the following: A little help with walking and/or transfers;A little help with bathing/dressing/bathroom;Assistance with cooking/housework;Assist for transportation;Help with stairs or ramp for entrance   Can travel by private vehicle        Equipment Recommendations  Rollator (4 wheels) (rollator at his home from eval note belongs to his wife) -clarified DME with patient, PT recommended rollator use in initial eval   Recommendations for Other Services       Precautions / Restrictions Precautions Precautions: Fall Recall of Precautions/Restrictions: Intact Precaution/Restrictions Comments: not on O2 baseline Restrictions Weight Bearing Restrictions Per Provider Order: No     Mobility  Bed Mobility Overal bed mobility: Modified Independent             General bed mobility comments: pt received in chair    Transfers Overall transfer level: Needs assistance Equipment used:  None   Sit to Stand: Contact guard assist           General transfer comment: to/from chair, no AD needed    Ambulation/Gait Ambulation/Gait assistance: Contact guard assist, Supervision Gait Distance (Feet): 8 Feet Assistive device:  (bed rail/furniture support) Gait Pattern/deviations: Decreased stride length, Trunk flexed       General Gait Details: distance limited by patient due to c/o fatigue after walking the whole unit hallway earlier in the day with student RN and rollator, pt also worked with OT, so not agreeable to mobilize away from bedside after stair training in room.   Stairs Stairs: Yes Stairs assistance: Contact guard assist, Min assist Stair Management: One rail Left, Step to pattern, Forwards, Backwards Number of Stairs: 5 General stair comments: single 7 platform step in room x5 reps, pt holding L elevated rail to simulate hand rail, pt unsure if son has installed railings for his home entry stairs yet; PTA providing CGA for safety when pt using rail and minA via HHA when pt not using rail for one step.   Wheelchair Mobility     Tilt Bed    Modified Rankin (Stroke Patients Only)       Balance Overall balance assessment: Needs assistance Sitting-balance support: Feet supported Sitting balance-Leahy Scale: Good     Standing balance support: No upper extremity supported, Single extremity supported, During functional activity Standing balance-Leahy Scale: Poor Standing balance comment: reaching for furniture when unsupported                            Communication Communication Communication:  Impaired Factors Affecting Communication: Hearing impaired  Cognition Arousal: Alert Behavior During Therapy: WFL for tasks assessed/performed, Flat affect   PT - Cognitive impairments: Safety/Judgement                       PT - Cognition Comments: Pt tangential, asking personal questions and needs redirection to task. He  states he was confused overnight, pulling off lines to get to [facility], forgot he was in the hospital. Currently appears A&O to self/situation, not further assessed as pt difficult to redirect to task. Following commands: Intact      Cueing Cueing Techniques: Verbal cues, Gestural cues  Exercises General Exercises - Lower Extremity Long Arc Quad: AROM, Both, 5 reps, Seated (for teachback, cues for 3-5 second hold) Other Exercises Other Exercises: Reviewed STS x 5 reps BID for BLE strengthening, with arms crossed at chest vs pushing with one arm from chair, pt receptive    General Comments General comments (skin integrity, edema, etc.): limited O2 assessment on RA due to limited mobility, defer to OT note; noisy signal with activity at bedside, pt refusing ambulation for ambulatory sats note      Pertinent Vitals/Pain Pain Assessment Pain Assessment: No/denies pain Pain Intervention(s): Monitored during session, Repositioned    Home Living                          Prior Function            PT Goals (current goals can now be found in the care plan section) Acute Rehab PT Goals Patient Stated Goal: get well, return home PT Goal Formulation: With patient Time For Goal Achievement: 07/28/24 Progress towards PT goals: Progressing toward goals    Frequency    Min 2X/week      PT Plan      Co-evaluation              AM-PAC PT 6 Clicks Mobility   Outcome Measure  Help needed turning from your back to your side while in a flat bed without using bedrails?: None Help needed moving from lying on your back to sitting on the side of a flat bed without using bedrails?: A Little (w/o rail) Help needed moving to and from a bed to a chair (including a wheelchair)?: A Little Help needed standing up from a chair using your arms (e.g., wheelchair or bedside chair)?: A Little Help needed to walk in hospital room?: A Little Help needed climbing 3-5 steps with a railing? : A  Little 6 Click Score: 19    End of Session Equipment Utilized During Treatment: Gait belt Activity Tolerance: Patient limited by fatigue;Treatment limited secondary to agitation Patient left: in chair;with call bell/phone within reach;with chair alarm set;Other (comment) (reclined) Nurse Communication: Mobility status;Other (comment) (student RN notified pt asking for more ice water and agreeable to bring it to him) PT Visit Diagnosis: Other abnormalities of gait and mobility (R26.89);Unsteadiness on feet (R26.81);Muscle weakness (generalized) (M62.81);Difficulty in walking, not elsewhere classified (R26.2)     Time: 8864-8855 PT Time Calculation (min) (ACUTE ONLY): 9 min  Charges:    $Therapeutic Activity: 8-22 mins PT General Charges $$ ACUTE PT VISIT: 1 Visit                     Mae Cianci P., PTA Acute Rehabilitation Services Secure Chat Preferred 9a-5:30pm Office: [phone]    Connell HERO [facility] 07/19/2024, 12:04 PM

## 2024-07-20 ENCOUNTER — Inpatient Hospital Stay (HOSPITAL_COMMUNITY)

## 2024-07-20 DIAGNOSIS — I130 Hypertensive heart and chronic kidney disease with heart failure and stage 1 through stage 4 chronic kidney disease, or unspecified chronic kidney disease: Secondary | ICD-10-CM

## 2024-07-20 DIAGNOSIS — E1122 Type 2 diabetes mellitus with diabetic chronic kidney disease: Secondary | ICD-10-CM

## 2024-07-20 DIAGNOSIS — G4733 Obstructive sleep apnea (adult) (pediatric): Secondary | ICD-10-CM

## 2024-07-20 DIAGNOSIS — E1159 Type 2 diabetes mellitus with other circulatory complications: Secondary | ICD-10-CM

## 2024-07-20 DIAGNOSIS — I350 Nonrheumatic aortic (valve) stenosis: Secondary | ICD-10-CM

## 2024-07-20 DIAGNOSIS — E785 Hyperlipidemia, unspecified: Secondary | ICD-10-CM

## 2024-07-20 DIAGNOSIS — F319 Bipolar disorder, unspecified: Secondary | ICD-10-CM

## 2024-07-20 DIAGNOSIS — I440 Atrioventricular block, first degree: Secondary | ICD-10-CM

## 2024-07-20 DIAGNOSIS — I4439 Other atrioventricular block: Secondary | ICD-10-CM

## 2024-07-20 DIAGNOSIS — I2510 Atherosclerotic heart disease of native coronary artery without angina pectoris: Secondary | ICD-10-CM

## 2024-07-20 DIAGNOSIS — E039 Hypothyroidism, unspecified: Secondary | ICD-10-CM

## 2024-07-20 DIAGNOSIS — N1832 Chronic kidney disease, stage 3b: Secondary | ICD-10-CM

## 2024-07-20 DIAGNOSIS — N179 Acute kidney failure, unspecified: Secondary | ICD-10-CM

## 2024-07-20 DIAGNOSIS — I5082 Biventricular heart failure: Secondary | ICD-10-CM

## 2024-07-20 DIAGNOSIS — I5023 Acute on chronic systolic (congestive) heart failure: Secondary | ICD-10-CM

## 2024-07-20 LAB — BASIC METABOLIC PANEL WITH GFR
Anion gap: 13 (ref 5–15)
BUN: 53 mg/dL — ABNORMAL HIGH (ref 8–23)
CO2: 29 mmol/L (ref 22–32)
Calcium: 8.8 mg/dL — ABNORMAL LOW (ref 8.9–10.3)
Chloride: 91 mmol/L — ABNORMAL LOW (ref 98–111)
Creatinine, Ser: 1.34 mg/dL — ABNORMAL HIGH (ref 0.61–1.24)
GFR, Estimated: 52 mL/min — ABNORMAL LOW (ref >60)
Glucose, Bld: 106 mg/dL — ABNORMAL HIGH (ref 70–99)
Potassium: 4 mmol/L (ref 3.5–5.1)
Sodium: 133 mmol/L — ABNORMAL LOW (ref 135–145)

## 2024-07-20 LAB — GLUCOSE, CAPILLARY
Glucose-Capillary: 81 mg/dL (ref 70–99)
Glucose-Capillary: 89 mg/dL (ref 70–99)
Glucose-Capillary: 91 mg/dL (ref 70–99)
Glucose-Capillary: 119 mg/dL — ABNORMAL HIGH (ref 70–99)

## 2024-07-20 LAB — COOXEMETRY PANEL
Carboxyhemoglobin: 2.2 % — ABNORMAL HIGH (ref 0.5–1.5)
Methemoglobin: <0.7 % (ref 0.0–1.5)
O2 Saturation: 73.2 %
Total hemoglobin: 12.8 g/dL (ref 12.0–16.0)

## 2024-07-20 LAB — MAGNESIUM: Magnesium: 2.1 mg/dL (ref 1.7–2.4)

## 2024-07-20 MED ORDER — FUROSEMIDE 40 MG PO TABS
40.0000 mg | ORAL_TABLET | Freq: Every day | ORAL | Status: DC
  Administered 2024-07-20 – 2024-07-21 (×2): 40 mg via ORAL
  Filled 2024-07-20 (×2): qty 1

## 2024-07-20 MED ORDER — POTASSIUM CHLORIDE CRYS ER 20 MEQ PO TBCR
20.0000 meq | EXTENDED_RELEASE_TABLET | Freq: Every day | ORAL | Status: DC
  Administered 2024-07-20 – 2024-07-21 (×2): 20 meq via ORAL
  Filled 2024-07-20 (×2): qty 1

## 2024-07-20 MED ORDER — IOHEXOL 350 MG/ML SOLN
100.0000 mL | Freq: Once | INTRAVENOUS | Status: AC | PRN
Start: 2024-07-20 — End: 2024-07-20
  Administered 2024-07-20: 100 mL via INTRAVENOUS

## 2024-07-20 NOTE — Consult Note (Addendum)
 [facility]   MULTIDISCIPLINARY HEART VALVE TEAM  Cardiology Consultation:   Patient ID: [name] MRN: [id number]; DOB: July 31, 1941  Admit date: 07/12/2024 Date of Consult: 07/20/2024  Primary Care Provider: Nichole Senior, MD [name] Cardiologist: Alm Clay, MD  [name] Electrophysiologist:  None    Patient Profile:   [name] is a 83 y.o. 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  Verlin.  History of Present Illness:   [name] lives in [place] with his wife.  She has medical problems and he is her primary caregiver. He has 2 living children. He is a very active and continues to drive and takes care of all of his own ADLs including cooking and cleaning. He walks without the aid of a walker or cane.  Of note, he has extremely poor dentition and has not been to the dentist in many years.  He has wanted to get dental extractions and dentures made, but has cost has been a deterrent.    He reports having a murmur his entire life and told it was not a problem.  He has never been followed by cardiologist.  He was in his usual state of health until the past 2 to 3 months when he began experiencing progressive shortness of breath and lower extremity/scrotal edema.  He presented to the [facility] ED on 07/12/2024 for further evaluation and found to be massively volume overloaded.    Echo 07/13/24 showed 30-35% with global hypokinesis and disproportionately severe hypokinesis/akinesis in the mid-apical left anterior and anteroseptal walls, moderate RV dysfunction/enlargement, mild MR, and severe AS with mean gradient 33 mm hg, Vmax 3.93 m/s, AVA 0.77, DVI 0.19,  SVI 41. There was interventricular septum flattened in systole, consistent with right ventricular pressure overload. He was diuresed initially with concern for low output with AKI w/ attempts at diuresis: Peak creatinine 1.91.The advanced heart failure team assumed care and was able to diurese him 16.6L and 30lbs (183 --> 153 lbs). His renal function improved with diuresis with a mild bump after his cardiac catheterization, creat 1.61, but improved to 1.3 today.   07/20/24: Cardiac gated CTA of the heart reveals anatomical characteristics consistent with aortic stenosis suitable for treatment by transcatheter aortic valve replacement without any significant complicating features. CTA of the aorta and iliac vessels demonstrate what appear to be adequate pelvic vascular access to facilitate a transfemoral approach; however, abdominal aorta and iliofemoral vessels. Case reviewed by team and felt to have adequate femoral access.   [facility] 07/18/24 showed 100% pLCX stenosis with R-->L collaterals and 90% mLAD as well as mildly elevated filling pressures (PA mean 38 mm hg, PCWP 18 mm hg with V waves to 26), CO/CI 4.67L/min, 2.56 L/minm2.    Structural heart team was consulted for consideration of TAVR and PCI. He was not felt to be a surgical candidate and plan was made for TAVR followed by staged PCI.    Past Medical History:  Diagnosis Date   Hypertension     Past Surgical History:  Procedure Laterality Date   APPENDECTOMY     RIGHT HEART CATH AND CORONARY ANGIOGRAPHY N/A 07/18/2024   Procedure: RIGHT HEART CATH AND  CORONARY ANGIOGRAPHY;  Surgeon: Zenaida Morene PARAS, MD;  Location: [facility] INVASIVE CV LAB;  Service: Cardiovascular;  Laterality: N/A;     Home Medications:  Prior to Admission medications   Medication Sig Start Date End Date Taking? Authorizing Provider  amLODipine  (NORVASC ) 2.5 MG tablet Take 2.5 mg by mouth daily.   Yes [provider]  carboxymethylcellulose (REFRESH PLUS) 0.5 %  SOLN Place 1 drop into both eyes daily as needed (dry eyes).   Yes [provider]  docusate sodium  (COLACE) 100 MG capsule Take 200 mg by mouth 2 (two) times daily as needed for mild constipation.   Yes [provider]  fluticasone (FLONASE) 50 MCG/ACT nasal spray Place 2 sprays into both nostrils daily as needed for allergies. 06/24/18  Yes [provider]  furosemide (LASIX) 40 MG tablet Take 40 mg by mouth daily.   Yes [provider]  levothyroxine (SYNTHROID) 75 MCG tablet Take 75 mcg by mouth daily before breakfast.   Yes [provider]  losartan (COZAAR) 50 MG tablet Take 50 mg by mouth daily.   Yes [provider]  polyethylene glycol (MIRALAX  / GLYCOLAX ) 17 g packet Take 17 g by mouth 2 (two) times daily as needed for mild constipation.   Yes [provider]  amLODipine  (NORVASC ) 5 MG tablet Take 1 tablet (5 mg total) by mouth daily. 02/11/22 02/11/23  Dennise Lavada POUR, MD    Inpatient Medications: Scheduled Meds:  aspirin  81 mg Oral Daily   atorvastatin  80 mg Oral Daily   Chlorhexidine Gluconate Cloth  6 each Topical Daily   dapagliflozin propanediol  10 mg Oral Daily   enoxaparin  (LOVENOX ) injection  40 mg Subcutaneous Q24H   furosemide  40 mg Oral Daily   insulin aspart  0-9 Units Subcutaneous TID WC   levothyroxine  75 mcg Oral Q0600   potassium chloride  20 mEq Oral Daily   sodium chloride  flush  10-40 mL Intracatheter Q12H   sodium chloride  flush  3 mL Intravenous Q12H   Continuous Infusions:  PRN Meds: acetaminophen  **OR** acetaminophen , polyethylene glycol, sodium chloride , sodium chloride  flush  Allergies:   No Known Allergies  Social History:   Social History   Socioeconomic History   Marital status: Married    Spouse name: Not on file   Number of children: Not on file   Years of education: Not on file   Highest education level: Not on file  Occupational History   Not on file  Tobacco Use    Smoking status: Former    Types: Cigarettes   Smokeless tobacco: Never  Substance and Sexual Activity   Alcohol use: Yes   Drug use: Never   Sexual activity: Not on file  Other Topics Concern   Not on file  Social History Narrative   Not on file   Social Drivers of Health   Financial Resource Strain: Not on file  Food Insecurity: Patient Declined (07/12/2024)   Hunger Vital Sign    Worried About Running Out of Food in the Last Year: Patient declined    Ran Out of Food in the Last Year: Patient declined  Transportation Needs: Patient Declined (07/12/2024)   PRAPARE - Administrator, Civil Service (Medical): Patient declined    Lack of Transportation (Non-Medical): Patient declined  Physical Activity: Not on file  Stress: Not on file  Social Connections: Patient Declined (07/12/2024)   Social Connection and Isolation Panel    Frequency of Communication  with Friends and Family: Patient declined    Frequency of Social Gatherings with Friends and Family: Patient declined    Attends Religious Services: Patient declined    Database administrator or Organizations: Patient declined    Attends Banker Meetings: Patient declined    Marital Status: Patient declined  Intimate Partner Violence: Not At Risk (07/12/2024)   Humiliation, Afraid, Rape, and Kick questionnaire    Fear of Current or Ex-Partner: No    Emotionally Abused: No    Physically Abused: No    Sexually Abused: No    Family History:   Family History  Problem Relation Age of Onset   CAD Father    Diabetes Mellitus II Maternal Grandmother      ROS:  Please see the history of present illness.  All other ROS reviewed and negative.     Physical Exam/Data:   Vitals:   07/20/24 0500 07/20/24 0753 07/20/24 1200 07/20/24 1455  BP: 110/69 100/88    Pulse: 65 (!) 58    Resp: 20 18    Temp:  98.8 F (37.1 C)    TempSrc:  Oral Oral Oral  SpO2: 98% 92%    Weight:      Height:         Intake/Output Summary (Last 24 hours) at 07/20/2024 1715 Last data filed at 07/20/2024 1245 Gross per 24 hour  Intake 253 ml  Output 1160 ml  Net -907 ml      07/20/2024    4:59 AM 07/20/2024    4:09 AM 07/19/2024    5:00 AM  Last 3 Weights  Weight (lbs) 153 lb 153 lb 3.2 oz 150 lb 9.2 oz  Weight (kg) 69.4 kg 69.491 kg 68.3 kg     Body mass index is 24.69 kg/m.  General:  Well nourished, well developed, in no acute distress HEENT: normal Neck: no JVD Cardiac:  normal S1, S2; RRR; harsh 3/6 SEM heard best at RUSB.  Lungs:  clear to auscultation bilaterally, no wheezing, rhonchi or rales  Abd: soft, nontender, no hepatomegaly  Ext: trace b/l edema Musculoskeletal:  No deformities, BUE and BLE strength normal and equal Skin: warm and dry  Neuro:  CNs 2-12 intact, no focal abnormalities noted Psych:  Normal affect   EKG:  The EKG was personally reviewed and demonstrates:  sinus with RBBB, 1st deg AV block (PR ), HR 60  Telemetry:  Telemetry was personally reviewed and demonstrates:  NSR w/ 1st deg AVB and occasional PVCs, HR 60s   Cardiac Studies & Procedures   ______________________________________________________________________________________________ CARDIAC CATHETERIZATION  CARDIAC CATHETERIZATION 07/18/2024  Conclusion   Prox Cx lesion is 100% stenosed.   Mid LAD lesion is 90% stenosed.  HEMODYNAMICS: RA:       8 mmHg (mean) RV:       65/4, 8 mmHg PA:       65/23 mmHg (38 mean) PCWP: 18 mmHg (mean) with v waves to 26  Estimated Fick CO/CI   4.67L/min, 2.56L/min/m2  IMPRESSION: Right heart catheterization and coronary angiography as part of pre TAVR workup Occluded Lcx with right to left collaterals Severe LAD disease. Discussed with Dr. Mady, would plan for ideally staged PCI prior to TAVR Mildly elevated filling pressures Normal cardiac index by assumed Fick  RECOMMENDATIONS: Discuss in structural conference  Findings Coronary Findings Diagnostic   Dominance: Right  Left Anterior Descending Supplies 1 diagonal branch before wrapping the apex. There is a severe, 90% lesion distal to D1.  Otherwise moderate diffuse disease. Mid LAD lesion is 90% stenosed.  Left Circumflex Occluded in the proximal segment, distal vessel fills by right to left collaterals. Large AV circumflex providing an atrial branch. Prox Cx lesion is 100% stenosed.  Right Coronary Artery Dominant for posterior circulation, supplies the PDA and a branching Pl. Large, ectatic vessel.  Mild 40% lesion in the mid RCA, otherwise diffuse disease. Supplies right to left collaterals to the Lcx. Dist RCA lesion is 40% stenosed.  Intervention  No interventions have been documented.     ECHOCARDIOGRAM  ECHOCARDIOGRAM COMPLETE 07/13/2024  Narrative ECHOCARDIOGRAM REPORT    Patient Name:   [name] Date of Exam: 07/13/2024 Medical Rec #:  [id number]   Height:       66.0 in Accession #:    [id number]  Weight:       182.1 lb Date of Birth:  28-Aug-1941    BSA:          1.922 m Patient Age:    83 years    BP:           135/83 mmHg Patient Gender: M           HR:           62 bpm. Exam Location:  Inpatient  Procedure: 2D Echo, Cardiac Doppler, Color Doppler and Intracardiac Opacification Agent (Both Spectral and Color Flow Doppler were utilized during procedure).  Indications:    CHF-Acute Systolic I50.21  History:        Patient has no prior history of Echocardiogram examinations. CHF, CAD, CKD, stage 3; Risk Factors:Hypertension, Dyslipidemia, Sleep Apnea and Diabetes.  Sonographer:    Thea Norlander RCS Referring Phys: [id number] MARSA NOVAK MELVIN  IMPRESSIONS   1. No left ventricular thrombus is seen (Definity contrast was used). Although there is global hypokinesis, there is disproportionately severe hypokinesis/akinesis in the mid-apical left anterior and anteroseptal walls. Left ventricular ejection fraction, by estimation, is 30 to 35%. The left  ventricle has moderately decreased function. The left ventricle demonstrates regional wall motion abnormalities (see scoring diagram/findings for description). Left ventricular diastolic parameters are consistent with Grade II diastolic dysfunction (pseudonormalization). Elevated left atrial pressure. There is the interventricular septum is flattened in systole, consistent with right ventricular pressure overload. 2. Right ventricular systolic function is moderately reduced. The right ventricular size is mildly enlarged. Tricuspid regurgitation signal is inadequate for assessing PA pressure. 3. Left atrial size was mildly dilated. 4. Right atrial size was mildly dilated. 5. The mitral valve is degenerative. Mild mitral valve regurgitation. No evidence of mitral stenosis. The mean mitral valve gradient is 3.0 mmHg with average heart rate of 66 bpm. Moderate to severe mitral annular calcification. 6. The aortic valve is tricuspid. There is severe calcifcation of the aortic valve. There is severe thickening of the aortic valve. Aortic valve regurgitation is trivial. Severe aortic valve stenosis. 7. The inferior vena cava is dilated in size with <50% respiratory variability, suggesting right atrial pressure of 15 mmHg.  FINDINGS Left Ventricle: No left ventricular thrombus is seen (Definity contrast was used). Although there is global hypokinesis, there is disproportionately severe hypokinesis/akinesis in the mid-apical left anterior and anteroseptal walls. Left ventricular ejection fraction, by estimation, is 30 to 35%. The left ventricle has moderately decreased function. The left ventricle demonstrates regional wall motion abnormalities. Definity contrast agent was given IV to delineate the left ventricular endocardial borders. The left ventricular internal cavity size was normal in size. There is borderline concentric left ventricular  hypertrophy. The interventricular septum is flattened in systole,  consistent with right ventricular pressure overload. Left ventricular diastolic parameters are consistent with Grade II diastolic dysfunction (pseudonormalization). Elevated left atrial pressure.   LV Wall Scoring: The mid and distal anterior wall and entire apex are akinetic. The antero-lateral wall, anterior septum, inferior wall, posterior wall, mid inferoseptal segment, basal anterior segment, and basal inferoseptal segment are hypokinetic.  Right Ventricle: The right ventricular size is mildly enlarged. No increase in right ventricular wall thickness. Right ventricular systolic function is moderately reduced. Tricuspid regurgitation signal is inadequate for assessing PA pressure.  Left Atrium: Left atrial size was mildly dilated.  Right Atrium: Right atrial size was mildly dilated.  Pericardium: There is no evidence of pericardial effusion.  Mitral Valve: The mitral valve is degenerative in appearance. Moderate to severe mitral annular calcification. Mild mitral valve regurgitation. No evidence of mitral valve stenosis. MV peak gradient, 9.4 mmHg. The mean mitral valve gradient is 3.0 mmHg with average heart rate of 66 bpm.  Tricuspid Valve: The tricuspid valve is normal in structure. Tricuspid valve regurgitation is mild.  Aortic Valve: The aortic valve is tricuspid. There is severe calcifcation of the aortic valve. There is severe thickening of the aortic valve. Aortic valve regurgitation is trivial. Severe aortic stenosis is present. Aortic valve mean gradient measures 33.0 mmHg. Aortic valve peak gradient measures 61.8 mmHg. Aortic valve area, by VTI measures 0.77 cm.  Pulmonic Valve: The pulmonic valve was normal in structure. Pulmonic valve regurgitation is mild. No evidence of pulmonic stenosis.  Aorta: The aortic root and ascending aorta are structurally normal, with no evidence of dilitation.  Venous: The inferior vena cava is dilated in size with less than 50%  respiratory variability, suggesting right atrial pressure of 15 mmHg.  IAS/Shunts: No atrial level shunt detected by color flow Doppler.   LEFT VENTRICLE PLAX 2D LVIDd:         4.92 cm   Diastology LVIDs:         4.16 cm   LV e' medial:    3.00 cm/s LV PW:         1.14 cm   LV E/e' medial:  43.0 LV IVS:        1.20 cm   LV e' lateral:   5.81 cm/s LVOT diam:     2.26 cm   LV E/e' lateral: 22.2 LV SV:         79 LV SV Index:   41 LVOT Area:     4.01 cm   RIGHT VENTRICLE            IVC RV S prime:     6.87 cm/s  IVC diam: 2.60 cm TAPSE (M-mode): 1.5 cm  LEFT ATRIUM             Index        RIGHT ATRIUM           Index LA diam:        4.33 cm 2.25 cm/m   RA Area:     21.60 cm LA Vol (A2C):   57.2 ml 29.77 ml/m  RA Volume:   66.10 ml  34.40 ml/m LA Vol (A4C):   42.1 ml 21.91 ml/m LA Biplane Vol: 54.6 ml 28.41 ml/m AORTIC VALVE AV Area (Vmax):    0.75 cm AV Area (Vmean):   0.72 cm AV Area (VTI):     0.77 cm AV Vmax:  393.00 cm/s AV Vmean:          265.500 cm/s AV VTI:            1.020 m AV Peak Grad:      61.8 mmHg AV Mean Grad:      33.0 mmHg LVOT Vmax:         73.90 cm/s LVOT Vmean:        47.700 cm/s LVOT VTI:          0.197 m LVOT/AV VTI ratio: 0.19  AORTA Ao Root diam: 3.79 cm Ao Asc diam:  3.65 cm  MITRAL VALVE MV Area (PHT): 3.85 cm     SHUNTS MV Area VTI:   2.05 cm     Systemic VTI:  0.20 m MV Peak grad:  9.4 mmHg     Systemic Diam: 2.26 cm MV Mean grad:  3.0 mmHg MV Vmax:       1.53 m/s MV Vmean:      77.0 cm/s MV Decel Time: 197 msec MV E velocity: 129.00 cm/s MV A velocity: 84.40 cm/s MV E/A ratio:  1.53  Mihai Croitoru MD Electronically signed by Jerel Balding MD Signature Date/Time: 07/13/2024/2:29:54 PM    Final      CT SCANS  CT CORONARY MORPH W/CTA COR W/SCORE 07/20/2024  Narrative CLINICAL DATA:  Aortic valve replacement (TAVR), pre-op eval  EXAM: Cardiac TAVR CT  TECHNIQUE: The patient was scanned on a  Siemens Force 192 slice scanner. A 120 kV retrospective scan was triggered in the descending thoracic aorta at 111 HU's. Gantry rotation speed was 270 msecs and collimation was .9 mm. The 3D data set was reconstructed in 5% intervals of the R-R cycle. Systolic and diastolic phases were analyzed on a dedicated work station using MPR, MIP and VRT modes. The patient received 100mL OMNIPAQUE  IOHEXOL  350 MG/ML SOLN of contrast.  FINDINGS: Motion artifact.  Aortic Valve:  Tricuspid aortic valve with severely reduced cusp excursion. Severely thickened and severely calcified aortic valve cusps.  AV calcium score: 3343  Virtual Basal Annulus Measurements: Phase assessed 35%.  Maximum/Minimum Diameter: 27.9 x 23 mm  Perimeter: 78.7 mm  Area:  473 mm2  Mild LVOT calcifications.  Membranous septal length: 5.9 mm  Based on these measurements, the annulus would be suitable for a 26 mm Sapien 3 valve. Alternatively, Heart Team can consider 29 mm Evolut valve. Recommend Heart Team discussion for valve selection.  Sinus of Valsalva Measurements:  Non-coronary:  36 mm  Right - coronary:  34 mm  Left - coronary:  35 mm  Coronary height and sinus of Valsalva Height:  Left main: 17.5 mm, Left sinus: 22.6 mm  Right coronary: 18.6 mm, Right sinus: 20.1 mm  Aorta: Conventional 3 vessel branch pattern of aortic arch.  Sinotubular Junction:  30 mm  Ascending Thoracic Aorta:  36 mm  Aortic Arch:  25 mm  Descending Thoracic Aorta:  25 mm  Coronary Arteries: Normal coronary origin. Right dominance. The study was performed without use of NTG and insufficient for plaque evaluation. Coronary artery calcium seen in 3 vessel distribution.  Optimum Fluoroscopic Angle for Delivery: LAO 4 CAU 5  OTHER:  Atria: Biatrial dilation  Left atrial appendage: No thrombus, slow flowing contrast.  Mitral valve: Grossly normal, mild-moderate mitral annular calcifications.  Pulmonary artery:  Mildly dilated.  Pulmonary veins: Normal anatomy.  IMPRESSION: 1. Tricuspid aortic valve with severely reduced cusp excursion. Severely thickened and severely calcified aortic valve cusps. 2. Aortic valve calcium  score: 3343 3. Annulus area: 473 mm2, suitable for 26 mm Sapien 3 valve. Mild LVOT calcifications. Membranous septal length 5.9 mm. 4. Sufficient coronary artery heights from annulus. 5. Optimum fluoroscopic angle for delivery:  LAO 4 CAU 5   Electronically Signed By: Soyla Merck M.D. On: 07/20/2024 12:26     ______________________________________________________________________________________________      Laboratory Data:  High Sensitivity Troponin:  No results for input(s): TROPONINIHS in the last 720 hours.   Chemistry Recent Labs  Lab 07/18/24 0500 07/18/24 1135 07/18/24 1252 07/19/24 0430 07/20/24 0510  NA 134*   < > 133* 133* 133*  K 4.0   < > 4.4 4.6 4.0  CL 92*  --   --  93* 91*  CO2 32  --   --  32 29  GLUCOSE 80  --   --  193* 106*  BUN 45*  --   --  50* 53*  CREATININE 1.53*  --   --  1.61* 1.34*  CALCIUM 9.0  --   --  8.7* 8.8*  GFRNONAA 45*  --   --  42* 52*  ANIONGAP 10  --   --  8 13   < > = values in this interval not displayed.    No results for input(s): PROT, ALBUMIN, AST, ALT, ALKPHOS, BILITOT in the last 168 hours. Hematology Recent Labs  Lab 07/18/24 1251 07/18/24 1252 07/19/24 0430  WBC  --   --  6.3  RBC  --   --  3.90*  HGB 15.6 16.0 11.2*  HCT 46.0 47.0 34.7*  MCV  --   --  89.0  MCH  --   --  28.7  MCHC  --   --  32.3  RDW  --   --  15.1  PLT  --   --  223   BNPNo results for input(s): BNP, PROBNP in the last 168 hours.  DDimer No results for input(s): DDIMER in the last 168 hours.   Radiology/Studies:  CT CORONARY MORPH W/CTA COR W/SCORE W/CA W/CM &/OR WO/CM Result Date: 07/20/2024 CLINICAL DATA:  Aortic valve replacement (TAVR), pre-op eval EXAM: Cardiac TAVR CT TECHNIQUE: The patient  was scanned on a Siemens Force 192 slice scanner. A 120 kV retrospective scan was triggered in the descending thoracic aorta at 111 HU's. Gantry rotation speed was 270 msecs and collimation was .9 mm. The 3D data set was reconstructed in 5% intervals of the R-R cycle. Systolic and diastolic phases were analyzed on a dedicated work station using MPR, MIP and VRT modes. The patient received 100mL OMNIPAQUE  IOHEXOL  350 MG/ML SOLN of contrast. FINDINGS: Motion artifact. Aortic Valve: Tricuspid aortic valve with severely reduced cusp excursion. Severely thickened and severely calcified aortic valve cusps. AV calcium score: 3343 Virtual Basal Annulus Measurements: Phase assessed 35%. Maximum/Minimum Diameter: 27.9 x 23 mm Perimeter: 78.7 mm Area:  473 mm2 Mild LVOT calcifications. Membranous septal length: 5.9 mm Based on these measurements, the annulus would be suitable for a 26 mm Sapien 3 valve. Alternatively, Heart Team can consider 29 mm Evolut valve. Recommend Heart Team discussion for valve selection. Sinus of Valsalva Measurements: Non-coronary:  36 mm Right - coronary:  34 mm Left - coronary:  35 mm Coronary height and sinus of Valsalva Height: Left main: 17.5 mm, Left sinus: 22.6 mm Right coronary: 18.6 mm, Right sinus: 20.1 mm Aorta: Conventional 3 vessel branch pattern of aortic arch. Sinotubular Junction:  30 mm Ascending Thoracic Aorta:  36 mm  Aortic Arch:  25 mm Descending Thoracic Aorta:  25 mm Coronary Arteries: Normal coronary origin. Right dominance. The study was performed without use of NTG and insufficient for plaque evaluation. Coronary artery calcium seen in 3 vessel distribution. Optimum Fluoroscopic Angle for Delivery: LAO 4 CAU 5 OTHER: Atria: Biatrial dilation Left atrial appendage: No thrombus, slow flowing contrast. Mitral valve: Grossly normal, mild-moderate mitral annular calcifications. Pulmonary artery: Mildly dilated. Pulmonary veins: Normal anatomy. IMPRESSION: 1. Tricuspid aortic valve  with severely reduced cusp excursion. Severely thickened and severely calcified aortic valve cusps. 2. Aortic valve calcium score: 3343 3. Annulus area: 473 mm2, suitable for 26 mm Sapien 3 valve. Mild LVOT calcifications. Membranous septal length 5.9 mm. 4. Sufficient coronary artery heights from annulus. 5. Optimum fluoroscopic angle for delivery:  LAO 4 CAU 5 Electronically Signed   By: Soyla Merck M.D.   On: 07/20/2024 12:26   DG Orthopantogram Result Date: 07/19/2024 CLINICAL DATA:  Poor dentition.  Possible TAVR. EXAM: ORTHOPANTOGRAM/PANORAMIC COMPARISON:  None Available. FINDINGS: Numerous prior tooth extractions with dental reconstructions. Persistent left mandibular third molar demonstrated within the bone, possibly impacted. Dental caries are demonstrated in multiple remaining teeth. No definite bone erosion or focal bone lesion. IMPRESSION: Multiple tooth extractions, dental reconstructions, and dental caries demonstrated. Residual left mandibular third molar may be impacted. Electronically Signed   By: Elsie Gravely M.D.   On: 07/19/2024 20:02   CARDIAC CATHETERIZATION Result Date: 07/18/2024   Prox Cx lesion is 100% stenosed.   Mid LAD lesion is 90% stenosed. 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 Discussed with Dr. Mady, would plan for ideally staged PCI prior to TAVR Mildly elevated filling pressures Normal cardiac index by assumed Fick RECOMMENDATIONS: Discuss in structural conference    STS Risk Calculator: Procedure Type: Isolated AVR Perioperative OutcomeEstimate % Operative Mortality 5.08% Morbidity & Mortality17.5% Stroke1.93% Renal Failure4.56% Reoperation4.66% Prolonged  Ventilation9.79% Deep Sternal Wound Infection0.037% Long Hospital Stay (>14 days)13.6% [facility] Stay (<6 days)*21.8%     ____________________   [place] Cardiomyopathy Questionnaire      07/19/2024    7:41 AM  [facility]Q-12  1 a. Ability to shower/bathe Quite a bit limited  1 b. Ability to walk 1 block Extremely limited  1 c. Ability to hurry/jog Other, Did not do  2. Edema feet/ankles/legs Every morning  3. Limited by fatigue All of the time  4. Limited by dyspnea All of the time  5. Sitting up / on 3+ pillows Every night  6. Limited enjoyment of life Extremely limited  8 a. Participation in hobbies Severely limited  8 b. Participation in chores Severely limited  8 c. Visiting family/friends Severely limited     ___________________________   Pre Surgical Assessment: 5 M Walk Test   37M=16.53ft   5 Meter Walk Test- trial 1: 5 seconds 5 Meter Walk Test- trial 2: 5.1 seconds 5 Meter Walk Test- trial 3: 5.0 seconds 5 Meter Walk Test Average: 5 seconds     Assessment and Plan:   [name] is a 83 y.o. male with symptoms of severe, stage D2 LFLG aortic stenosis  with NYHA Class IV symptoms currently admitted for acute biventricular failure and massive volume overload complicated by cardiorenal syndrome and obstructive CAD.   Echo 07/13/24 showed 30-35% with global hypokinesis and disproportionately severe hypokinesis/akinesis in the mid-apical left anterior and anteroseptal walls, moderate RV dysfunction/enlargement, mild MR, and severe AS with mean gradient 33 mm hg, Vmax 3.93 m/s, AVA 0.77, DVI 0.19, SVI 41. There was interventricular septum flattened in systole, consistent with right ventricular pressure overload. He was diuresed initially with concern for low output with AKI w/ attempts at diuresis. However, he was able to be diuresed without inotropes.    [facility] 07/18/24 showed 100% pLCX stenosis with R-->L collaterals and 90% mLAD as well as mildly elevated filling  pressures (PA mean 38 mm hg, PCWP 18 mm hg with V waves to 26), CO/CI 4.67L/min, 2.56 L/minm2.  07/20/24: Cardiac gated CTA of the heart reveals anatomical characteristics consistent with aortic stenosis suitable for treatment by transcatheter aortic valve replacement without any significant complicating features. CTA of the aorta and iliac vessels demonstrate what appear to be adequate pelvic vascular access to facilitate a transfemoral approach; however, abdominal aorta and iliofemoral vessels. Case reviewed by team and felt to have adequate femoral access.   I have reviewed the natural history of aortic stenosis with the patient. We have discussed the limitations of medical therapy and the poor prognosis associated with symptomatic aortic stenosis. We have reviewed potential treatment options, including palliative medical therapy, conventional surgical aortic valve replacement, and transcatheter aortic valve replacement. We discussed treatment options in the context of this patient's specific comorbid medical conditions.    The patient's predicted risk of mortality with conventional aortic valve replacement is 5.08% primarily based on acute CHF with LV dysfunction, AKI, DMT2, HTN and obstructive CAD requiring revascularization. Other significant comorbid conditions include right heart failure. He is not a candidate for surgery valve replacement under any circumstances given age and comorbidities.     The patient is currently euvolemic and creatinine improved to 1.34. 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 His 90% mLAD stenosis will require revascularization, but it was felt to be safer to treat the valve first followed by staged PCI. He has had no angina.  Pt has poor dentition but orthopantogram did not show any active infection or abscesses. Will plan for full mouth dental extraction after valve surgery.   Plan TAVR-TF next  Tuesday with Dr. Verlin and Dr. Shyrl. Formal report of peripheral CTAs are still pending.  Dr. Shyrl to comment on bailout candidacy.   For questions or updates, please contact [name] Please consult [email or web address] for contact info under    Signed, David Hummer, PA-C  07/20/2024 5:15 PM         [address]       [name] [id number]             [phone]          Assessment / Plan:   83 y.o. male with severe aortic stenosis.  NYHA Class 3.  The risks and benefits of transfemoral TAVR were discussed in detail.  We also discussed possibility of an emergent sternotomy to address any procedural complications.  Based on our discussion, we collectively decided that an emergent sternotomy would not be indicated.  The patient is agreeable to proceed.  Based on my review of her LHC, echo, and CTA, I agree with  the multidisciplinary plan to proceed with a TF TAVR.      [name] 07/25/2024 1:21 PM

## 2024-07-20 NOTE — Progress Notes (Signed)
 Responded to unit regarding order to remove PICC. Spoke with nurse to verify. Per nurse, the original plan for the pt to discharge tonight was placed on hold with potential discharge tomorrow. Pt continues to have limited veinous access due to chronic illness. Nurse in agreement with plan to hold on discontinuing PICC until discharge plans are confirmed.

## 2024-07-20 NOTE — Progress Notes (Signed)
 PT Cancellation Note  Patient Details Name: [name] MRN: [id number] DOB: [birth year]   Cancelled Treatment:    Reason Eval/Treat Not Completed: (P) Patient at procedure or test/unavailable (at CT imaging dept.) Will continue efforts per PT plan of care as schedule permits.   Robert Sunga M Vickii Volland 07/20/2024, 10:18 AM

## 2024-07-20 NOTE — Progress Notes (Signed)
 Noted order to discontinue PICC. Secure chat sent to RN.

## 2024-07-20 NOTE — H&P (View-Only) (Signed)
 [facility]   MULTIDISCIPLINARY HEART VALVE TEAM  Cardiology Consultation:   Patient ID: Johnanthony Wilden MRN: [id number]; DOB: July 31, 1941  Admit date: 07/12/2024 Date of Consult: 07/20/2024  Primary Care Provider: Nichole Senior, MD [facility] Cardiologist: Alm Clay, MD  [facility] Electrophysiologist:  None    Patient Profile:   Oluwadamilare Tobler is a 83 y.o. 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  Verlin.  History of Present Illness:   Mr. Robar lives in [place] with his wife.  She has medical problems and he is her primary caregiver. He has 2 living children. He is a very active and continues to drive and takes care of all of his own ADLs including cooking and cleaning. He walks without the aid of a walker or cane.  Of note, he has extremely poor dentition and has not been to the dentist in many years.  He has wanted to get dental extractions and dentures made, but has cost has been a deterrent.    He reports having a murmur his entire life and told it was not a problem.  He has never been followed by cardiologist.  He was in his usual state of health until the past 2 to 3 months when he began experiencing progressive shortness of breath and lower extremity/scrotal edema.  He presented to the [facility] ED on 07/12/2024 for further evaluation and found to be massively volume overloaded.    Echo 07/13/24 showed 30-35% with global hypokinesis and disproportionately severe hypokinesis/akinesis in the mid-apical left anterior and anteroseptal walls, moderate RV dysfunction/enlargement, mild MR, and severe AS with mean gradient 33 mm hg, Vmax 3.93 m/s, AVA 0.77, DVI 0.19,  SVI 41. There was interventricular septum flattened in systole, consistent with right ventricular pressure overload. He was diuresed initially with concern for low output with AKI w/ attempts at diuresis: Peak creatinine 1.91.The advanced heart failure team assumed care and was able to diurese him 16.6L and 30lbs (183 --> 153 lbs). His renal function improved with diuresis with a mild bump after his cardiac catheterization, creat 1.61, but improved to 1.3 today.   07/20/24: Cardiac gated CTA of the heart reveals anatomical characteristics consistent with aortic stenosis suitable for treatment by transcatheter aortic valve replacement without any significant complicating features. CTA of the aorta and iliac vessels demonstrate what appear to be adequate pelvic vascular access to facilitate a transfemoral approach; however, abdominal aorta and iliofemoral vessels. Case reviewed by team and felt to have adequate femoral access.   [facility] 07/18/24 showed 100% pLCX stenosis with R-->L collaterals and 90% mLAD as well as mildly elevated filling pressures (PA mean 38 mm hg, PCWP 18 mm hg with V waves to 26), CO/CI 4.67L/min, 2.56 L/minm2.    Structural heart team was consulted for consideration of TAVR and PCI. He was not felt to be a surgical candidate and plan was made for TAVR followed by staged PCI.    Past Medical History:  Diagnosis Date   Hypertension     Past Surgical History:  Procedure Laterality Date   APPENDECTOMY     RIGHT HEART CATH AND CORONARY ANGIOGRAPHY N/A 07/18/2024   Procedure: RIGHT HEART CATH AND  CORONARY ANGIOGRAPHY;  Surgeon: Zenaida Morene PARAS, MD;  Location: [facility] INVASIVE CV LAB;  Service: Cardiovascular;  Laterality: N/A;     Home Medications:  Prior to Admission medications   Medication Sig Start Date End Date Taking? Authorizing Provider  amLODipine  (NORVASC ) 2.5 MG tablet Take 2.5 mg by mouth daily.   Yes [provider]  carboxymethylcellulose (REFRESH PLUS) 0.5 %  SOLN Place 1 drop into both eyes daily as needed (dry eyes).   Yes [provider]  docusate sodium  (COLACE) 100 MG capsule Take 200 mg by mouth 2 (two) times daily as needed for mild constipation.   Yes [provider]  fluticasone (FLONASE) 50 MCG/ACT nasal spray Place 2 sprays into both nostrils daily as needed for allergies. 06/24/18  Yes [provider]  furosemide (LASIX) 40 MG tablet Take 40 mg by mouth daily.   Yes [provider]  levothyroxine (SYNTHROID) 75 MCG tablet Take 75 mcg by mouth daily before breakfast.   Yes [provider]  losartan (COZAAR) 50 MG tablet Take 50 mg by mouth daily.   Yes [provider]  polyethylene glycol (MIRALAX  / GLYCOLAX ) 17 g packet Take 17 g by mouth 2 (two) times daily as needed for mild constipation.   Yes [provider]  amLODipine  (NORVASC ) 5 MG tablet Take 1 tablet (5 mg total) by mouth daily. 02/11/22 02/11/23  Dennise Lavada POUR, MD    Inpatient Medications: Scheduled Meds:  aspirin  81 mg Oral Daily   atorvastatin  80 mg Oral Daily   Chlorhexidine Gluconate Cloth  6 each Topical Daily   dapagliflozin propanediol  10 mg Oral Daily   enoxaparin  (LOVENOX ) injection  40 mg Subcutaneous Q24H   furosemide  40 mg Oral Daily   insulin aspart  0-9 Units Subcutaneous TID WC   levothyroxine  75 mcg Oral Q0600   potassium chloride  20 mEq Oral Daily   sodium chloride  flush  10-40 mL Intracatheter Q12H   sodium chloride  flush  3 mL Intravenous Q12H   Continuous Infusions:  PRN Meds: acetaminophen  **OR** acetaminophen , polyethylene glycol, sodium chloride , sodium chloride  flush  Allergies:   No Known Allergies  Social History:   Social History   Socioeconomic History   Marital status: Married    Spouse name: Not on file   Number of children: Not on file   Years of education: Not on file   Highest education level: Not on file  Occupational History   Not on file  Tobacco Use    Smoking status: Former    Types: Cigarettes   Smokeless tobacco: Never  Substance and Sexual Activity   Alcohol use: Yes   Drug use: Never   Sexual activity: Not on file  Other Topics Concern   Not on file  Social History Narrative   Not on file   Social Drivers of Health   Financial Resource Strain: Not on file  Food Insecurity: Patient Declined (07/12/2024)   Hunger Vital Sign    Worried About Running Out of Food in the Last Year: Patient declined    Ran Out of Food in the Last Year: Patient declined  Transportation Needs: Patient Declined (07/12/2024)   PRAPARE - Administrator, Civil Service (Medical): Patient declined    Lack of Transportation (Non-Medical): Patient declined  Physical Activity: Not on file  Stress: Not on file  Social Connections: Patient Declined (07/12/2024)   Social Connection and Isolation Panel    Frequency of Communication  with Friends and Family: Patient declined    Frequency of Social Gatherings with Friends and Family: Patient declined    Attends Religious Services: Patient declined    Database administrator or Organizations: Patient declined    Attends Banker Meetings: Patient declined    Marital Status: Patient declined  Intimate Partner Violence: Not At Risk (07/12/2024)   Humiliation, Afraid, Rape, and Kick questionnaire    Fear of Current or Ex-Partner: No    Emotionally Abused: No    Physically Abused: No    Sexually Abused: No    Family History:   Family History  Problem Relation Age of Onset   CAD Father    Diabetes Mellitus II Maternal Grandmother      ROS:  Please see the history of present illness.  All other ROS reviewed and negative.     Physical Exam/Data:   Vitals:   07/20/24 0500 07/20/24 0753 07/20/24 1200 07/20/24 1455  BP: 110/69 100/88    Pulse: 65 (!) 58    Resp: 20 18    Temp:  98.8 F (37.1 C)    TempSrc:  Oral Oral Oral  SpO2: 98% 92%    Weight:      Height:         Intake/Output Summary (Last 24 hours) at 07/20/2024 1715 Last data filed at 07/20/2024 1245 Gross per 24 hour  Intake 253 ml  Output 1160 ml  Net -907 ml      07/20/2024    4:59 AM 07/20/2024    4:09 AM 07/19/2024    5:00 AM  Last 3 Weights  Weight (lbs) 153 lb 153 lb 3.2 oz 150 lb 9.2 oz  Weight (kg) 69.4 kg 69.491 kg 68.3 kg     Body mass index is 24.69 kg/m.  General:  Well nourished, well developed, in no acute distress HEENT: normal Neck: no JVD Cardiac:  normal S1, S2; RRR; harsh 3/6 SEM heard best at RUSB.  Lungs:  clear to auscultation bilaterally, no wheezing, rhonchi or rales  Abd: soft, nontender, no hepatomegaly  Ext: trace b/l edema Musculoskeletal:  No deformities, BUE and BLE strength normal and equal Skin: warm and dry  Neuro:  CNs 2-12 intact, no focal abnormalities noted Psych:  Normal affect   EKG:  The EKG was personally reviewed and demonstrates:  sinus with RBBB, 1st deg AV block (PR ), HR 60  Telemetry:  Telemetry was personally reviewed and demonstrates:  NSR w/ 1st deg AVB and occasional PVCs, HR 60s   Cardiac Studies & Procedures   ______________________________________________________________________________________________ CARDIAC CATHETERIZATION  CARDIAC CATHETERIZATION 07/18/2024  Conclusion   Prox Cx lesion is 100% stenosed.   Mid LAD lesion is 90% stenosed.  HEMODYNAMICS: RA:       8 mmHg (mean) RV:       65/4, 8 mmHg PA:       65/23 mmHg (38 mean) PCWP: 18 mmHg (mean) with v waves to 26  Estimated Fick CO/CI   4.67L/min, 2.56L/min/m2  IMPRESSION: Right heart catheterization and coronary angiography as part of pre TAVR workup Occluded Lcx with right to left collaterals Severe LAD disease. Discussed with Dr. Mady, would plan for ideally staged PCI prior to TAVR Mildly elevated filling pressures Normal cardiac index by assumed Fick  RECOMMENDATIONS: Discuss in structural conference  Findings Coronary Findings Diagnostic   Dominance: Right  Left Anterior Descending Supplies 1 diagonal branch before wrapping the apex. There is a severe, 90% lesion distal to D1.  Otherwise moderate diffuse disease. Mid LAD lesion is 90% stenosed.  Left Circumflex Occluded in the proximal segment, distal vessel fills by right to left collaterals. Large AV circumflex providing an atrial branch. Prox Cx lesion is 100% stenosed.  Right Coronary Artery Dominant for posterior circulation, supplies the PDA and a branching Pl. Large, ectatic vessel.  Mild 40% lesion in the mid RCA, otherwise diffuse disease. Supplies right to left collaterals to the Lcx. Dist RCA lesion is 40% stenosed.  Intervention  No interventions have been documented.     ECHOCARDIOGRAM  ECHOCARDIOGRAM COMPLETE 07/13/2024  Narrative ECHOCARDIOGRAM REPORT    Patient Name:   MESSIYAH WATERSON Date of Exam: 07/13/2024 Medical Rec #:  [id number]   Height:       66.0 in Accession #:    [id number]  Weight:       182.1 lb Date of Birth:  28-Aug-1941    BSA:          1.922 m Patient Age:    83 years    BP:           135/83 mmHg Patient Gender: M           HR:           62 bpm. Exam Location:  Inpatient  Procedure: 2D Echo, Cardiac Doppler, Color Doppler and Intracardiac Opacification Agent (Both Spectral and Color Flow Doppler were utilized during procedure).  Indications:    CHF-Acute Systolic I50.21  History:        Patient has no prior history of Echocardiogram examinations. CHF, CAD, CKD, stage 3; Risk Factors:Hypertension, Dyslipidemia, Sleep Apnea and Diabetes.  Sonographer:    Thea Norlander RCS Referring Phys: [id number] MARSA NOVAK MELVIN  IMPRESSIONS   1. No left ventricular thrombus is seen (Definity contrast was used). Although there is global hypokinesis, there is disproportionately severe hypokinesis/akinesis in the mid-apical left anterior and anteroseptal walls. Left ventricular ejection fraction, by estimation, is 30 to 35%. The left  ventricle has moderately decreased function. The left ventricle demonstrates regional wall motion abnormalities (see scoring diagram/findings for description). Left ventricular diastolic parameters are consistent with Grade II diastolic dysfunction (pseudonormalization). Elevated left atrial pressure. There is the interventricular septum is flattened in systole, consistent with right ventricular pressure overload. 2. Right ventricular systolic function is moderately reduced. The right ventricular size is mildly enlarged. Tricuspid regurgitation signal is inadequate for assessing PA pressure. 3. Left atrial size was mildly dilated. 4. Right atrial size was mildly dilated. 5. The mitral valve is degenerative. Mild mitral valve regurgitation. No evidence of mitral stenosis. The mean mitral valve gradient is 3.0 mmHg with average heart rate of 66 bpm. Moderate to severe mitral annular calcification. 6. The aortic valve is tricuspid. There is severe calcifcation of the aortic valve. There is severe thickening of the aortic valve. Aortic valve regurgitation is trivial. Severe aortic valve stenosis. 7. The inferior vena cava is dilated in size with <50% respiratory variability, suggesting right atrial pressure of 15 mmHg.  FINDINGS Left Ventricle: No left ventricular thrombus is seen (Definity contrast was used). Although there is global hypokinesis, there is disproportionately severe hypokinesis/akinesis in the mid-apical left anterior and anteroseptal walls. Left ventricular ejection fraction, by estimation, is 30 to 35%. The left ventricle has moderately decreased function. The left ventricle demonstrates regional wall motion abnormalities. Definity contrast agent was given IV to delineate the left ventricular endocardial borders. The left ventricular internal cavity size was normal in size. There is borderline concentric left ventricular  hypertrophy. The interventricular septum is flattened in systole,  consistent with right ventricular pressure overload. Left ventricular diastolic parameters are consistent with Grade II diastolic dysfunction (pseudonormalization). Elevated left atrial pressure.   LV Wall Scoring: The mid and distal anterior wall and entire apex are akinetic. The antero-lateral wall, anterior septum, inferior wall, posterior wall, mid inferoseptal segment, basal anterior segment, and basal inferoseptal segment are hypokinetic.  Right Ventricle: The right ventricular size is mildly enlarged. No increase in right ventricular wall thickness. Right ventricular systolic function is moderately reduced. Tricuspid regurgitation signal is inadequate for assessing PA pressure.  Left Atrium: Left atrial size was mildly dilated.  Right Atrium: Right atrial size was mildly dilated.  Pericardium: There is no evidence of pericardial effusion.  Mitral Valve: The mitral valve is degenerative in appearance. Moderate to severe mitral annular calcification. Mild mitral valve regurgitation. No evidence of mitral valve stenosis. MV peak gradient, 9.4 mmHg. The mean mitral valve gradient is 3.0 mmHg with average heart rate of 66 bpm.  Tricuspid Valve: The tricuspid valve is normal in structure. Tricuspid valve regurgitation is mild.  Aortic Valve: The aortic valve is tricuspid. There is severe calcifcation of the aortic valve. There is severe thickening of the aortic valve. Aortic valve regurgitation is trivial. Severe aortic stenosis is present. Aortic valve mean gradient measures 33.0 mmHg. Aortic valve peak gradient measures 61.8 mmHg. Aortic valve area, by VTI measures 0.77 cm.  Pulmonic Valve: The pulmonic valve was normal in structure. Pulmonic valve regurgitation is mild. No evidence of pulmonic stenosis.  Aorta: The aortic root and ascending aorta are structurally normal, with no evidence of dilitation.  Venous: The inferior vena cava is dilated in size with less than 50%  respiratory variability, suggesting right atrial pressure of 15 mmHg.  IAS/Shunts: No atrial level shunt detected by color flow Doppler.   LEFT VENTRICLE PLAX 2D LVIDd:         4.92 cm   Diastology LVIDs:         4.16 cm   LV e' medial:    3.00 cm/s LV PW:         1.14 cm   LV E/e' medial:  43.0 LV IVS:        1.20 cm   LV e' lateral:   5.81 cm/s LVOT diam:     2.26 cm   LV E/e' lateral: 22.2 LV SV:         79 LV SV Index:   41 LVOT Area:     4.01 cm   RIGHT VENTRICLE            IVC RV S prime:     6.87 cm/s  IVC diam: 2.60 cm TAPSE (M-mode): 1.5 cm  LEFT ATRIUM             Index        RIGHT ATRIUM           Index LA diam:        4.33 cm 2.25 cm/m   RA Area:     21.60 cm LA Vol (A2C):   57.2 ml 29.77 ml/m  RA Volume:   66.10 ml  34.40 ml/m LA Vol (A4C):   42.1 ml 21.91 ml/m LA Biplane Vol: 54.6 ml 28.41 ml/m AORTIC VALVE AV Area (Vmax):    0.75 cm AV Area (Vmean):   0.72 cm AV Area (VTI):     0.77 cm AV Vmax:  393.00 cm/s AV Vmean:          265.500 cm/s AV VTI:            1.020 m AV Peak Grad:      61.8 mmHg AV Mean Grad:      33.0 mmHg LVOT Vmax:         73.90 cm/s LVOT Vmean:        47.700 cm/s LVOT VTI:          0.197 m LVOT/AV VTI ratio: 0.19  AORTA Ao Root diam: 3.79 cm Ao Asc diam:  3.65 cm  MITRAL VALVE MV Area (PHT): 3.85 cm     SHUNTS MV Area VTI:   2.05 cm     Systemic VTI:  0.20 m MV Peak grad:  9.4 mmHg     Systemic Diam: 2.26 cm MV Mean grad:  3.0 mmHg MV Vmax:       1.53 m/s MV Vmean:      77.0 cm/s MV Decel Time: 197 msec MV E velocity: 129.00 cm/s MV A velocity: 84.40 cm/s MV E/A ratio:  1.53  Mihai Croitoru MD Electronically signed by Jerel Balding MD Signature Date/Time: 07/13/2024/2:29:54 PM    Final      CT SCANS  CT CORONARY MORPH W/CTA COR W/SCORE 07/20/2024  Narrative CLINICAL DATA:  Aortic valve replacement (TAVR), pre-op eval  EXAM: Cardiac TAVR CT  TECHNIQUE: The patient was scanned on a  Siemens Force 192 slice scanner. A 120 kV retrospective scan was triggered in the descending thoracic aorta at 111 HU's. Gantry rotation speed was 270 msecs and collimation was .9 mm. The 3D data set was reconstructed in 5% intervals of the R-R cycle. Systolic and diastolic phases were analyzed on a dedicated work station using MPR, MIP and VRT modes. The patient received 100mL OMNIPAQUE  IOHEXOL  350 MG/ML SOLN of contrast.  FINDINGS: Motion artifact.  Aortic Valve:  Tricuspid aortic valve with severely reduced cusp excursion. Severely thickened and severely calcified aortic valve cusps.  AV calcium score: 3343  Virtual Basal Annulus Measurements: Phase assessed 35%.  Maximum/Minimum Diameter: 27.9 x 23 mm  Perimeter: 78.7 mm  Area:  473 mm2  Mild LVOT calcifications.  Membranous septal length: 5.9 mm  Based on these measurements, the annulus would be suitable for a 26 mm Sapien 3 valve. Alternatively, Heart Team can consider 29 mm Evolut valve. Recommend Heart Team discussion for valve selection.  Sinus of Valsalva Measurements:  Non-coronary:  36 mm  Right - coronary:  34 mm  Left - coronary:  35 mm  Coronary height and sinus of Valsalva Height:  Left main: 17.5 mm, Left sinus: 22.6 mm  Right coronary: 18.6 mm, Right sinus: 20.1 mm  Aorta: Conventional 3 vessel branch pattern of aortic arch.  Sinotubular Junction:  30 mm  Ascending Thoracic Aorta:  36 mm  Aortic Arch:  25 mm  Descending Thoracic Aorta:  25 mm  Coronary Arteries: Normal coronary origin. Right dominance. The study was performed without use of NTG and insufficient for plaque evaluation. Coronary artery calcium seen in 3 vessel distribution.  Optimum Fluoroscopic Angle for Delivery: LAO 4 CAU 5  OTHER:  Atria: Biatrial dilation  Left atrial appendage: No thrombus, slow flowing contrast.  Mitral valve: Grossly normal, mild-moderate mitral annular calcifications.  Pulmonary artery:  Mildly dilated.  Pulmonary veins: Normal anatomy.  IMPRESSION: 1. Tricuspid aortic valve with severely reduced cusp excursion. Severely thickened and severely calcified aortic valve cusps. 2. Aortic valve calcium  score: 3343 3. Annulus area: 473 mm2, suitable for 26 mm Sapien 3 valve. Mild LVOT calcifications. Membranous septal length 5.9 mm. 4. Sufficient coronary artery heights from annulus. 5. Optimum fluoroscopic angle for delivery:  LAO 4 CAU 5   Electronically Signed By: Soyla Merck M.D. On: 07/20/2024 12:26     ______________________________________________________________________________________________      Laboratory Data:  High Sensitivity Troponin:  No results for input(s): TROPONINIHS in the last 720 hours.   Chemistry Recent Labs  Lab 07/18/24 0500 07/18/24 1135 07/18/24 1252 07/19/24 0430 07/20/24 0510  NA 134*   < > 133* 133* 133*  K 4.0   < > 4.4 4.6 4.0  CL 92*  --   --  93* 91*  CO2 32  --   --  32 29  GLUCOSE 80  --   --  193* 106*  BUN 45*  --   --  50* 53*  CREATININE 1.53*  --   --  1.61* 1.34*  CALCIUM 9.0  --   --  8.7* 8.8*  GFRNONAA 45*  --   --  42* 52*  ANIONGAP 10  --   --  8 13   < > = values in this interval not displayed.    No results for input(s): PROT, ALBUMIN, AST, ALT, ALKPHOS, BILITOT in the last 168 hours. Hematology Recent Labs  Lab 07/18/24 1251 07/18/24 1252 07/19/24 0430  WBC  --   --  6.3  RBC  --   --  3.90*  HGB 15.6 16.0 11.2*  HCT 46.0 47.0 34.7*  MCV  --   --  89.0  MCH  --   --  28.7  MCHC  --   --  32.3  RDW  --   --  15.1  PLT  --   --  223   BNPNo results for input(s): BNP, PROBNP in the last 168 hours.  DDimer No results for input(s): DDIMER in the last 168 hours.   Radiology/Studies:  CT CORONARY MORPH W/CTA COR W/SCORE W/CA W/CM &/OR WO/CM Result Date: 07/20/2024 CLINICAL DATA:  Aortic valve replacement (TAVR), pre-op eval EXAM: Cardiac TAVR CT TECHNIQUE: The patient  was scanned on a Siemens Force 192 slice scanner. A 120 kV retrospective scan was triggered in the descending thoracic aorta at 111 HU's. Gantry rotation speed was 270 msecs and collimation was .9 mm. The 3D data set was reconstructed in 5% intervals of the R-R cycle. Systolic and diastolic phases were analyzed on a dedicated work station using MPR, MIP and VRT modes. The patient received 100mL OMNIPAQUE  IOHEXOL  350 MG/ML SOLN of contrast. FINDINGS: Motion artifact. Aortic Valve: Tricuspid aortic valve with severely reduced cusp excursion. Severely thickened and severely calcified aortic valve cusps. AV calcium score: 3343 Virtual Basal Annulus Measurements: Phase assessed 35%. Maximum/Minimum Diameter: 27.9 x 23 mm Perimeter: 78.7 mm Area:  473 mm2 Mild LVOT calcifications. Membranous septal length: 5.9 mm Based on these measurements, the annulus would be suitable for a 26 mm Sapien 3 valve. Alternatively, Heart Team can consider 29 mm Evolut valve. Recommend Heart Team discussion for valve selection. Sinus of Valsalva Measurements: Non-coronary:  36 mm Right - coronary:  34 mm Left - coronary:  35 mm Coronary height and sinus of Valsalva Height: Left main: 17.5 mm, Left sinus: 22.6 mm Right coronary: 18.6 mm, Right sinus: 20.1 mm Aorta: Conventional 3 vessel branch pattern of aortic arch. Sinotubular Junction:  30 mm Ascending Thoracic Aorta:  36 mm  Aortic Arch:  25 mm Descending Thoracic Aorta:  25 mm Coronary Arteries: Normal coronary origin. Right dominance. The study was performed without use of NTG and insufficient for plaque evaluation. Coronary artery calcium seen in 3 vessel distribution. Optimum Fluoroscopic Angle for Delivery: LAO 4 CAU 5 OTHER: Atria: Biatrial dilation Left atrial appendage: No thrombus, slow flowing contrast. Mitral valve: Grossly normal, mild-moderate mitral annular calcifications. Pulmonary artery: Mildly dilated. Pulmonary veins: Normal anatomy. IMPRESSION: 1. Tricuspid aortic valve  with severely reduced cusp excursion. Severely thickened and severely calcified aortic valve cusps. 2. Aortic valve calcium score: 3343 3. Annulus area: 473 mm2, suitable for 26 mm Sapien 3 valve. Mild LVOT calcifications. Membranous septal length 5.9 mm. 4. Sufficient coronary artery heights from annulus. 5. Optimum fluoroscopic angle for delivery:  LAO 4 CAU 5 Electronically Signed   By: Soyla Merck M.D.   On: 07/20/2024 12:26   DG Orthopantogram Result Date: 07/19/2024 CLINICAL DATA:  Poor dentition.  Possible TAVR. EXAM: ORTHOPANTOGRAM/PANORAMIC COMPARISON:  None Available. FINDINGS: Numerous prior tooth extractions with dental reconstructions. Persistent left mandibular third molar demonstrated within the bone, possibly impacted. Dental caries are demonstrated in multiple remaining teeth. No definite bone erosion or focal bone lesion. IMPRESSION: Multiple tooth extractions, dental reconstructions, and dental caries demonstrated. Residual left mandibular third molar may be impacted. Electronically Signed   By: Elsie Gravely M.D.   On: 07/19/2024 20:02   CARDIAC CATHETERIZATION Result Date: 07/18/2024   Prox Cx lesion is 100% stenosed.   Mid LAD lesion is 90% stenosed. 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 Discussed with Dr. Mady, would plan for ideally staged PCI prior to TAVR Mildly elevated filling pressures Normal cardiac index by assumed Fick RECOMMENDATIONS: Discuss in structural conference    STS Risk Calculator: Procedure Type: Isolated AVR Perioperative OutcomeEstimate % Operative Mortality 5.08% Morbidity & Mortality17.5% Stroke1.93% Renal Failure4.56% Reoperation4.66% Prolonged  Ventilation9.79% Deep Sternal Wound Infection0.037% Long Hospital Stay (>14 days)13.6% [facility] Stay (<6 days)*21.8%     ____________________   [place] Cardiomyopathy Questionnaire      07/19/2024    7:41 AM  KCCQ-12  1 a. Ability to shower/bathe Quite a bit limited  1 b. Ability to walk 1 block Extremely limited  1 c. Ability to hurry/jog Other, Did not do  2. Edema feet/ankles/legs Every morning  3. Limited by fatigue All of the time  4. Limited by dyspnea All of the time  5. Sitting up / on 3+ pillows Every night  6. Limited enjoyment of life Extremely limited  8 a. Participation in hobbies Severely limited  8 b. Participation in chores Severely limited  8 c. Visiting family/friends Severely limited     ___________________________   Pre Surgical Assessment: 5 M Walk Test   37M=16.53ft   5 Meter Walk Test- trial 1: 5 seconds 5 Meter Walk Test- trial 2: 5.1 seconds 5 Meter Walk Test- trial 3: 5.0 seconds 5 Meter Walk Test Average: 5 seconds     Assessment and Plan:   Veryl Winemiller is a 83 y.o. male with symptoms of severe, stage D2 LFLG aortic stenosis  with NYHA Class IV symptoms currently admitted for acute biventricular failure and massive volume overload complicated by cardiorenal syndrome and obstructive CAD.   Echo 07/13/24 showed 30-35% with global hypokinesis and disproportionately severe hypokinesis/akinesis in the mid-apical left anterior and anteroseptal walls, moderate RV dysfunction/enlargement, mild MR, and severe AS with mean gradient 33 mm hg, Vmax 3.93 m/s, AVA 0.77, DVI 0.19, SVI 41. There was interventricular septum flattened in systole, consistent with right ventricular pressure overload. He was diuresed initially with concern for low output with AKI w/ attempts at diuresis. However, he was able to be diuresed without inotropes.    [facility] 07/18/24 showed 100% pLCX stenosis with R-->L collaterals and 90% mLAD as well as mildly elevated filling  pressures (PA mean 38 mm hg, PCWP 18 mm hg with V waves to 26), CO/CI 4.67L/min, 2.56 L/minm2.  07/20/24: Cardiac gated CTA of the heart reveals anatomical characteristics consistent with aortic stenosis suitable for treatment by transcatheter aortic valve replacement without any significant complicating features. CTA of the aorta and iliac vessels demonstrate what appear to be adequate pelvic vascular access to facilitate a transfemoral approach; however, abdominal aorta and iliofemoral vessels. Case reviewed by team and felt to have adequate femoral access.   I have reviewed the natural history of aortic stenosis with the patient. We have discussed the limitations of medical therapy and the poor prognosis associated with symptomatic aortic stenosis. We have reviewed potential treatment options, including palliative medical therapy, conventional surgical aortic valve replacement, and transcatheter aortic valve replacement. We discussed treatment options in the context of this patient's specific comorbid medical conditions.    The patient's predicted risk of mortality with conventional aortic valve replacement is 5.08% primarily based on acute CHF with LV dysfunction, AKI, DMT2, HTN and obstructive CAD requiring revascularization. Other significant comorbid conditions include right heart failure. He is not a candidate for surgery valve replacement under any circumstances given age and comorbidities.     The patient is currently euvolemic and creatinine improved to 1.34. 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 His 90% mLAD stenosis will require revascularization, but it was felt to be safer to treat the valve first followed by staged PCI. He has had no angina.  Pt has poor dentition but orthopantogram did not show any active infection or abscesses. Will plan for full mouth dental extraction after valve surgery.   Plan TAVR-TF next  Tuesday with Dr. Verlin and Dr. Shyrl. Formal report of peripheral CTAs are still pending.  Dr. Shyrl to comment on bailout candidacy.   For questions or updates, please contact [facility] Please consult [email or web address] for contact info under    Signed, Lamarr Hummer, PA-C  07/20/2024 5:15 PM         [address]       [place] [id number]             [phone]          Assessment / Plan:   83 y.o. male with severe aortic stenosis.  NYHA Class 3.  The risks and benefits of transfemoral TAVR were discussed in detail.  We also discussed possibility of an emergent sternotomy to address any procedural complications.  Based on our discussion, we collectively decided that an emergent sternotomy would not be indicated.  The patient is agreeable to proceed.  Based on my review of her LHC, echo, and CTA, I agree with  the multidisciplinary plan to proceed with a TF TAVR.      Nysha Koplin O Bryah [place] 07/25/2024 1:21 PM

## 2024-07-20 NOTE — Progress Notes (Signed)
 Physical Therapy Treatment Patient Details Name: [name] MRN: [id number] DOB: 18-Jun-1941 Today's Date: 07/20/2024   History of Present Illness Pt is a 83 y.o male admitted 9/30 for acute CHF. Chest x-ray showed vascular exacerbation. 10/6 cath and coronary angiogram. PMH: HTN, HLD, DM, hypothyroidism, CAD, CKD, bipolar dx, OSA    PT Comments  Pt tolerates treatment well, ambulating for increased distances with stable vitals on room air. Pt benefits from cues to improve posture when ambulating and to reduce gait speed some to improve stability and reduce falls risk. PT encourages frequent mobilization with staff assistance to continue to improve activity tolerance and balance. HHPT remains recommended.    If plan is discharge home, recommend the following: A little help with bathing/dressing/bathroom;Assistance with cooking/housework;Assist for transportation;Help with stairs or ramp for entrance   Can travel by private vehicle        Equipment Recommendations  Rollator (4 wheels)    Recommendations for Other Services       Precautions / Restrictions Precautions Precautions: Fall Recall of Precautions/Restrictions: Intact Precaution/Restrictions Comments: monitor sats Restrictions Weight Bearing Restrictions Per Provider Order: No     Mobility  Bed Mobility Overal bed mobility: Modified Independent                  Transfers Overall transfer level: Needs assistance Equipment used: Rollator (4 wheels) Transfers: Sit to/from Stand Sit to Stand: Supervision                Ambulation/Gait Ambulation/Gait assistance: Supervision Gait Distance (Feet): 300 Feet Assistive device: Rollator (4 wheels) Gait Pattern/deviations: Step-through pattern Gait velocity: functional Gait velocity interpretation: >2.62 ft/sec, indicative of community ambulatory   General Gait Details: brisk step-through gait, PT provides cues to reduce gait speed and to maintain rollator  closer to BOS to increase trunk extension and to promote a more upright posture   Stairs             Wheelchair Mobility     Tilt Bed    Modified Rankin (Stroke Patients Only)       Balance Overall balance assessment: Needs assistance Sitting-balance support: No upper extremity supported, Feet supported Sitting balance-Leahy Scale: Good     Standing balance support: Single extremity supported, Reliant on assistive device for balance Standing balance-Leahy Scale: Poor                              Communication Communication Communication: No apparent difficulties  Cognition Arousal: Alert Behavior During Therapy: WFL for tasks assessed/performed, Flat affect   PT - Cognitive impairments: Safety/Judgement                       PT - Cognition Comments: pt with brisk walking pace, mildly impulsive but responds quickly to redirection from PT Following commands: Intact      Cueing Cueing Techniques: Verbal cues  Exercises      General Comments General comments (skin integrity, edema, etc.): VSS on RA      Pertinent Vitals/Pain Pain Assessment Pain Assessment: No/denies pain    Home Living                          Prior Function            PT Goals (current goals can now be found in the care plan section) Acute Rehab PT Goals Patient Stated Goal: get well,  return home Progress towards PT goals: Progressing toward goals    Frequency    Min 2X/week      PT Plan      Co-evaluation              AM-PAC PT 6 Clicks Mobility   Outcome Measure  Help needed turning from your back to your side while in a flat bed without using bedrails?: None Help needed moving from lying on your back to sitting on the side of a flat bed without using bedrails?: None Help needed moving to and from a bed to a chair (including a wheelchair)?: A Little Help needed standing up from a chair using your arms (e.g., wheelchair or  bedside chair)?: A Little Help needed to walk in hospital room?: A Little Help needed climbing 3-5 steps with a railing? : A Lot 6 Click Score: 19    End of Session   Activity Tolerance: Patient tolerated treatment well Patient left: in bed;with call bell/phone within reach;with bed alarm set Nurse Communication: Mobility status PT Visit Diagnosis: Other abnormalities of gait and mobility (R26.89);Unsteadiness on feet (R26.81);Muscle weakness (generalized) (M62.81);Difficulty in walking, not elsewhere classified (R26.2)     Time: 8351-8287 PT Time Calculation (min) (ACUTE ONLY): 24 min  Charges:    $Gait Training: 8-22 mins $Therapeutic Activity: 8-22 mins PT General Charges $$ ACUTE PT VISIT: 1 Visit                     Bernardino JINNY Ruth, PT, DPT Acute Rehabilitation Office [phone]    Bernardino JINNY Ruth 07/20/2024, 5:30 PM

## 2024-07-20 NOTE — Progress Notes (Addendum)
 Advanced Heart Failure Rounding Note  Cardiologist: Alm Clay, MD  Chief Complaint: Aortic stenosis Subjective:    Co-ox 73%, CVP 10.   Scr continues to improved, 1.9>>1.6>>1.3   Denies CP. No resting dyspnea.     Cardiac Studies  LHC  Occluded Lcx with right to left collaterals Severe LAD disease  RHC  Mildly elevated filling pressures Normal cardiac index by assumed Fick  Right     RA Mean  mmHg 7    RA A-Wave  mmHg 10    RA V-Wave  mmHg 9  Pulmonary     PA  mmHg 69/23 (38)    PCW Mean  mmHg 18.0    PCW A-Wave  mmHg 20.0    PCW V-Wave  mmHg 26.0    PAPi   6.6    Saturations Phases Resting    PA  % 60    Arterial  % 96   Fick Cardiac Output 4.67 L/min  Fick Cardiac Output Index 2.56 (L/min)/BSA     Objective:    Weight Range: 69.4 kg Body mass index is 24.69 kg/m.   Vital Signs:   Temp:  [97.7 F (36.5 C)-98.8 F (37.1 C)] 98.8 F (37.1 C) (10/08 0753) Pulse Rate:  [47-68] 58 (10/08 0753) Resp:  [8-20] 18 (10/08 0753) BP: (90-110)/(56-88) 100/88 (10/08 0753) SpO2:  [87 %-98 %] 92 % (10/08 0753) Weight:  [69.4 kg-69.5 kg] 69.4 kg (10/08 0459) Last BM Date : 07/19/24  Weight change: Filed Weights   07/19/24 0500 07/20/24 0409 07/20/24 0459  Weight: 68.3 kg 69.5 kg 69.4 kg   Intake/Output:  Intake/Output Summary (Last 24 hours) at 07/20/2024 0945 Last data filed at 07/20/2024 0943 Gross per 24 hour  Intake 233 ml  Output 1175 ml  Net -942 ml    Physical Exam  CVP 10  GENERAL: elderly male, NAD Lungs- clear CARDIAC:  JVP: 10 cm          Normal rate with regular rhythm. 3/6 SEM, trace b/l LEE  ABDOMEN: Soft, non-tender, non-distended.  EXTREMITIES: Warm and well perfused.  NEUROLOGIC: No obvious FND   Telemetry   NSR w/ 1st deg AVB and occasional PVCs, HR 60s (personally reviewed)  Labs    CBC Recent Labs    07/18/24 1252 07/19/24 0430  WBC  --  6.3  HGB 16.0 11.2*  HCT 47.0 34.7*  MCV  --  89.0  PLT  --  223    Basic Metabolic Panel Recent Labs    89/92/74 0430 07/20/24 0510  NA 133* 133*  K 4.6 4.0  CL 93* 91*  CO2 32 29  GLUCOSE 193* 106*  BUN 50* 53*  CREATININE 1.61* 1.34*  CALCIUM 8.7* 8.8*  MG 2.0 2.1   BNP (last 3 results) Recent Labs    07/12/24 1530  BNP 4,159.6*   Hemoglobin A1C No results for input(s): HGBA1C in the last 72 hours.  Medications:    Scheduled Medications:  aspirin  81 mg Oral Daily   atorvastatin  80 mg Oral Daily   Chlorhexidine Gluconate Cloth  6 each Topical Daily   dapagliflozin propanediol  10 mg Oral Daily   enoxaparin  (LOVENOX ) injection  40 mg Subcutaneous Q24H   insulin aspart  0-9 Units Subcutaneous TID WC   levothyroxine  75 mcg Oral Q0600   sodium chloride  flush  10-40 mL Intracatheter Q12H   sodium chloride  flush  3 mL Intravenous Q12H   Infusions:    PRN Medications:  acetaminophen  **OR** acetaminophen , polyethylene glycol, sodium chloride , sodium chloride  flush  Patient Profile   83 y/o male w/ HTN, HLD, Type 2 DM, CKD IIIa, hypothyroidism, OSA and bioplar disorder but no prior cardiac history, who presented w/ new HF symptoms and marked volume overload. Echo w/ biventricular failure and severe AS. AHF team consulted for low output HF.   Assessment/Plan   1. 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 Interventricular septum flattened in systole, consistent with right ventricular pressure overload, moderate RV dysfunction with mild RV enlargement. 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 Co-ox have been  marginal, but ok.  - RHC after diuresis showed mildly elevated filling pressures and normal CI by FICK (RA 7, PCW 18, CI 2.6) - LHC occluded Lcx with right to left collaterals and severe LAD disease  - Awaiting TAVR/PCI - Co-ox 73%. CVP 10 today - Continue Farxiga 10 mg daily  - Add PO Lasix 40 mg daily  - AKI improving but will hold off on additional GDMT currently given soft BP and severe AS     2. Aortic Stenosis - severe by TTE, AVA by VTI measured at 72 cm, mean gradient 33 mmHg - given age, TAVR likely best suitable option. D/w structural heart team. Tentatively planning TAVR 10/14. Plan pre TAVR scans today  - note, also w/ baseline RBBB will be at risk of needing PPM w/ TAVR   3. CAD - LHC w/ severe LAD disease and occluded LCx w/ R>>L collaterals - will need staged PCI after TAVR. D/w IC  - he denies CP  - continue atorva 80 mg - continue ASA 81 mg    4. AKI on CKD IIIa - Scr 1.3 on admit. Peaked to 1.9. Improving, 1.3 today  - continue Farxiga  - follow BMP   Awaiting possible TAVR next Tuesday. From a HF standpoint, he can go home today. Still waiting on CT surgery to assess.   Cardiac Meds if discharged today   ASA 81 mg daily  Atorvastatin 80 mg daily  Farxiga 10 mg daily  Lasix 40 mg daily  KCl 20 mEq daily   Will arrange f/u in the [facility].    Length of Stay: [address] PA-C  07/20/2024, 9:45 AM  Advanced Heart Failure Team Pager [phone] (M-F; 7a - 5p)  Please contact [facility] for night-coverage after hours (5p -7a ) and weekends on [email or web address]  Patient seen and examined with the above-signed Advanced Practice Provider and/or Housestaff. I personally reviewed laboratory data, imaging studies and relevant notes. I independently examined the patient and formulated the important aspects of the plan. I have edited the note to reflect any of my changes or salient points. I have personally discussed the plan with the patient and/or  family.   Feels good. Denies CP or SOB. Co-ox 73% off inotropes. CVP 10 Scr improving with holding lasix.   TAVR CTs complete. TAVR team deciding on best access approach  Denies CP or SOB  General:  Elderly. No resp difficulty HEENT: normal Neck: supple. JVP 10 Cor: Regular rate & rhythm. 3/6 AS  Lungs: clear Abdomen: soft, nontender, nondistended. No hepatosplenomegaly. No bruits or masses. Good bowel sounds. Extremities: no cyanosis, clubbing, rash, 1+ woody edema Neuro: alert & orientedx3, cranial nerves grossly intact. moves all 4 extremities w/o difficulty. Affect pleasant  He still has mild volume overload but had AKI with further attempts at diuresis. This is as good as we can get him for now.   I have reviewed his meds carefully with pharmD and team.   I also discussed his case with Structural Team - final decision on TAVR access route being discussed  From HF perspective he is stable for d/c today and can be readmitted for TAVR next week (or whenever procedure is scheduled for).   We will arrange f/u in HF Clinic  Time spent 42 mins  Toribio Fuel, MD  5:56 PM

## 2024-07-21 ENCOUNTER — Other Ambulatory Visit: Payer: Self-pay

## 2024-07-21 ENCOUNTER — Other Ambulatory Visit (HOSPITAL_COMMUNITY): Payer: Self-pay

## 2024-07-21 DIAGNOSIS — I350 Nonrheumatic aortic (valve) stenosis: Secondary | ICD-10-CM

## 2024-07-21 DIAGNOSIS — I5023 Acute on chronic systolic (congestive) heart failure: Secondary | ICD-10-CM | POA: Diagnosis not present

## 2024-07-21 LAB — GLUCOSE, CAPILLARY
Glucose-Capillary: 144 mg/dL — ABNORMAL HIGH (ref 70–99)
Glucose-Capillary: 103 mg/dL — ABNORMAL HIGH (ref 70–99)

## 2024-07-21 LAB — COOXEMETRY PANEL
Carboxyhemoglobin: 1.1 % (ref 0.5–1.5)
Methemoglobin: 1.1 % (ref 0.0–1.5)
O2 Saturation: 61.1 %
Total hemoglobin: 11.7 g/dL — ABNORMAL LOW (ref 12.0–16.0)

## 2024-07-21 LAB — BASIC METABOLIC PANEL WITH GFR
Anion gap: 12 (ref 5–15)
BUN: 48 mg/dL — ABNORMAL HIGH (ref 8–23)
CO2: 29 mmol/L (ref 22–32)
Calcium: 8.8 mg/dL — ABNORMAL LOW (ref 8.9–10.3)
Chloride: 93 mmol/L — ABNORMAL LOW (ref 98–111)
Creatinine, Ser: 1.53 mg/dL — ABNORMAL HIGH (ref 0.61–1.24)
GFR, Estimated: 45 mL/min — ABNORMAL LOW (ref >60)
Glucose, Bld: 130 mg/dL — ABNORMAL HIGH (ref 70–99)
Potassium: 4.2 mmol/L (ref 3.5–5.1)
Sodium: 134 mmol/L — ABNORMAL LOW (ref 135–145)

## 2024-07-21 LAB — MAGNESIUM: Magnesium: 2.1 mg/dL (ref 1.7–2.4)

## 2024-07-21 MED ORDER — FUROSEMIDE 40 MG PO TABS
40.0000 mg | ORAL_TABLET | Freq: Every day | ORAL | 1 refills | Status: DC
  Filled 2024-07-21: qty 30, 30d supply, fill #0

## 2024-07-21 MED ORDER — POTASSIUM CHLORIDE CRYS ER 20 MEQ PO TBCR
20.0000 meq | EXTENDED_RELEASE_TABLET | Freq: Every day | ORAL | 1 refills | Status: DC
  Filled 2024-07-21: qty 30, 30d supply, fill #0

## 2024-07-21 MED ORDER — ATORVASTATIN CALCIUM 80 MG PO TABS
80.0000 mg | ORAL_TABLET | Freq: Every day | ORAL | 1 refills | Status: DC
  Filled 2024-07-21: qty 30, 30d supply, fill #0

## 2024-07-21 MED ORDER — ASPIRIN 81 MG PO CHEW
81.0000 mg | CHEWABLE_TABLET | Freq: Every day | ORAL | 0 refills | Status: DC
  Filled 2024-07-21: qty 30, 30d supply, fill #0

## 2024-07-21 MED ORDER — DAPAGLIFLOZIN PROPANEDIOL 10 MG PO TABS
10.0000 mg | ORAL_TABLET | Freq: Every day | ORAL | 1 refills | Status: DC
  Filled 2024-07-21: qty 30, 30d supply, fill #0

## 2024-07-21 NOTE — Discharge Summary (Signed)
 Physician Discharge Summary  [name] FMW:[id number] DOB: 1941-03-13 DOA: 07/12/2024  PCP: Nichole Senior, MD  Admit date: 07/12/2024 Discharge date: 07/21/2024  Time spent: 45 minutes  Recommendations for Outpatient Follow-up:  Plan for TAVR next week 10/14 Needs interval PCI to mid LAD after TAVR in a few weeks Needs dental extraction after above   Discharge Diagnoses:  Principal Problem:   Acute on chronic systolic CHF (congestive heart failure) (HCC) Severe aortic stenosis   Essential hypertension   Coronary artery disease   Stage 3a chronic kidney disease (HCC)   Type 2 diabetes mellitus with hyperlipidemia (HCC)   Acquired hypothyroidism   BPH (benign prostatic hyperplasia)   GERD   Bipolar disorder (HCC)   Sleep apnea   Biventricular heart failure, NYHA class 3 (HCC)   Acute cor pulmonale (HCC)   Symptomatic severe aortic stenosis with low ejection fraction   Discharge Condition: Improved low-sodium  Diet recommendation:, Heart healthy  Filed Weights   07/20/24 0409 07/20/24 0459 07/21/24 0500  Weight: 69.5 kg 69.4 kg 69.2 kg    History of present illness:  83/M w hypertension, hyperlipidemia, T2DM, coronary artery disease, CKD stage 3a, BPH, biplar and OSA who presented with dyspnea.  Reported one month of worsening lower extremity edema, and dyspnea on exertion. Positive 15 lbs weight gain and expanding edema to his scrotum. Symptoms refractive to loop diuretic at home.  In ED, volume overloaded,  cr 1,42 , BNP 4,159  CXR w hypoinflation, positive cardiomegaly, bilateral hilar vascular congestion, fluid in the right fissure, bilateral pleural effusions.  -Patient was placed on furosemide for diuresis.  -Echocardiogram with reduced LV systolic function and severe aortic stenosis.  10/03 worsening renal function, PICC Line placed SVO 69.3  - 10/7 seen by structural heart team in consultation    Hospital Course:   Acute on chronic systolic CHF, BiV  Failure -Echow EF 30 to 35%, global hypokinesis,Akinetic mid and distal anterior wall and entire apex.Hypokinetic antero lateral wall, anterior septum, inferior wall, posterior wall, mid infero septal segment, basal anterior segment, and basal inferoseptal segment. Grade II diastolic dysfunction, RV systolic function with moderate reduction, severe aortic valve stenosis.  -10/06 cardiac catheterization RA 8 RV 65/4 mean 8 PA 65/23 mean 38 PCWP 18 Cardiac output 4,67 and index 2.56 (Fick)  - Improved with diuresis, appears euvolemic now, continue Farxiga, Lasix 40 mg daily - Caution with additional GDMT considering soft BP, aortic stenosis -Discharged home in a stable condition, follow-up with structural heart team next week for TAVR   Severe Aortic stenosis -Structural heart team following, tentatively planned for TAVR on 10/14   Acute hypoxemic respiratory failure.  -Resolved   Essential hypertension -meds as above   Coronary artery disease Proximal Cx lesion 100% stenosed Mid LAD lesion 90% stenosed.  Continue medical therapy with aspirin and statin Plan for PCI after TAVR   Stage 3a chronic kidney disease (HCC) AKI hyponatremia.  -Holding on loop diuretic therapy and follow up renal function in am.    Type 2 diabetes mellitus  Stable, SSI, Farxiga   Acquired hypothyroidism Continue levothyroxine.    BPH (benign prostatic hyperplasia) No signs of urinary retention    GERD Continue pantoprazole.    Bipolar disorder (HCC) No current medications, follow up as outpatient    Sleep apnea Cpap   Dental caries -No abscess on orthopantogram, discussed with structural heart team, plan for extractions after TAVR    Discharge Exam: Vitals:   07/21/24 0800 07/21/24 1126  BP: ROLLEN)  95/58 (!) 90/56  Pulse: (!) 58 (!) 56  Resp: 20 17  Temp:  98.1 F (36.7 C)  SpO2: 91% 96%   Gen: Awake, Alert, Oriented X 3,  HEENT: no JVD Lungs: Good air movement bilaterally, CTAB CVS:  S1S2/RRR systolic murmur Abd: soft, Non tender, non distended, BS present Extremities: No edema Skin: no new rashes on exposed skin   Discharge Instructions   Discharge Instructions     Diet - low sodium heart healthy   Complete by: As directed    Increase activity slowly   Complete by: As directed       Allergies as of 07/21/2024   No Known Allergies      Medication List     STOP taking these medications    amLODipine  2.5 MG tablet Commonly known as: NORVASC    amLODipine  5 MG tablet Commonly known as: NORVASC    losartan 50 MG tablet Commonly known as: COZAAR       TAKE these medications    aspirin 81 MG chewable tablet Chew 1 tablet (81 mg total) by mouth daily. Start taking on: July 22, 2024   atorvastatin 80 MG tablet Commonly known as: LIPITOR Take 1 tablet (80 mg total) by mouth daily. Start taking on: July 22, 2024   carboxymethylcellulose 0.5 % Soln Commonly known as: REFRESH PLUS Place 1 drop into both eyes daily as needed (dry eyes).   dapagliflozin propanediol 10 MG Tabs tablet Commonly known as: FARXIGA Take 1 tablet (10 mg total) by mouth daily. Start taking on: July 22, 2024   docusate sodium  100 MG capsule Commonly known as: COLACE Take 200 mg by mouth 2 (two) times daily as needed for mild constipation.   fluticasone 50 MCG/ACT nasal spray Commonly known as: FLONASE Place 2 sprays into both nostrils daily as needed for allergies.   furosemide 40 MG tablet Commonly known as: LASIX Take 1 tablet (40 mg total) by mouth daily.   levothyroxine 75 MCG tablet Commonly known as: SYNTHROID Take 75 mcg by mouth daily before breakfast.   polyethylene glycol 17 g packet Commonly known as: MIRALAX  / GLYCOLAX  Take 17 g by mouth 2 (two) times daily as needed for mild constipation.   potassium chloride SA 20 MEQ tablet Commonly known as: KLOR-CON M Take 1 tablet (20 mEq total) by mouth daily. Start taking on: July 22, 2024        No Known Allergies  Follow-up Information     [facility] Follow up.   Specialty: Cardiology Why: Hospital Follow Up in the [facility]   08/03/24 at 2:00 PM Contact information: [address] [place]  [phone]                 The results of significant diagnostics from this hospitalization (including imaging, microbiology, ancillary and laboratory) are listed below for reference.    Significant Diagnostic Studies: CT ANGIO ABDOMEN PELVIS  W &/OR WO CONTRAST Result Date: 07/20/2024 CLINICAL DATA:  Aortic valve replacement TAVR preoperative evaluation. EXAM: CT ANGIOGRAPHY CHEST, ABDOMEN AND PELVIS TECHNIQUE: Multidetector CT imaging through the chest, abdomen and pelvis was performed using the standard protocol during bolus administration of intravenous contrast. Multiplanar reconstructed images and MIPs were obtained and reviewed to evaluate the vascular anatomy. RADIATION DOSE REDUCTION: This exam was performed according to the departmental dose-optimization program which includes automated exposure control, adjustment of the mA and/or kV according to patient size and/or use  of iterative reconstruction technique. CONTRAST:  OMNIPAQUE  IOHEXOL  350 MG/ML SOLN COMPARISON:  CT abdomen pelvis 01/25/2022 FINDINGS: CTA CHEST FINDINGS Cardiovascular: Normal caliber thoracic aorta. No aortic dissection. Calcification in the aortic valve, thoracic aorta, and coronary arteries. Great vessel origins are patent. Cardiac enlargement. No pericardial effusions. Calcification in the mitral valve annulus. Mediastinum/Nodes: Thyroid  gland is unremarkable. Esophagus is decompressed. Lymph nodes demonstrated throughout the mediastinum without pathologic enlargement, likely reactive. Lungs/Pleura: Small right pleural effusion with basilar atelectasis. Peribronchial thickening and scarring most  prominent in the right hilar region with mild bronchiectasis in the lung bases. No mucous plugging or focal lesions identified. This likely represents chronic bronchitis. Mild emphysematous changes in the lungs. No pneumothorax. Musculoskeletal: Degenerative changes in the spine. No acute bony abnormalities. Review of the MIP images confirms the above findings. CTA ABDOMEN AND PELVIS FINDINGS VASCULAR Aorta: Normal caliber aorta without aneurysm, dissection, vasculitis or significant stenosis. Diffuse aortic calcification and tortuosity. Celiac: Patent without evidence of aneurysm, dissection, vasculitis or significant stenosis. SMA: Patent without evidence of aneurysm, dissection, vasculitis or significant stenosis. Renals: Duplicated right and triplicated left renal arteries are patent without evidence of aneurysm, dissection, vasculitis, fibromuscular dysplasia or significant stenosis. 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 Inflow: Very short bilateral common iliac arteries with high bifurcation into internal and external iliac arteries bilaterally. Tortuous external iliac arteries. Diffuse calcification. No aneurysm, dissection, or critical stenosis. Veins: No obvious venous abnormality within the limitations of this arterial phase study. Review of the MIP images confirms the above findings. NON-VASCULAR Hepatobiliary: No focal liver abnormality is seen. No gallstones, gallbladder wall thickening, or biliary dilatation. Pancreas: Atrophic pancreatic parenchyma. No ductal dilatation. Cyst in the tail of the pancreas measuring 11 mm diameter. This is enlarging since the previous study. No peripancreatic infiltration. Spleen: Normal in size without focal abnormality. Adrenals/Urinary Tract: No adrenal gland nodules. Kidneys are symmetrical with symmetrical nephrograms. No hydronephrosis or  hydroureter. Bladder is normal. Stomach/Bowel: Stomach, small bowel, and colon are not abnormally distended. Diffusely stool-filled colon. No wall thickening or inflammatory stranding are identified. Appendix is not identified. Lymphatic: No significant lymphadenopathy. Reproductive: Prostate gland is enlarged. Other: Mild diffuse soft tissue edema in the subcutaneous fat. No free air or free fluid in the abdomen. Abdominal wall musculature appears intact. Musculoskeletal: Lumbar scoliosis convex towards the right. Diffuse degenerative change in the lumbar spine. No acute bony abnormalities. Review of the MIP images confirms the above findings. IMPRESSION: 1. Diffuse aortic atherosclerosis. Calcification of the aortic valve. No aneurysm or dissection involving the thoracic or abdominal aorta. 2. Mild cardiac enlargement with calcification of the mitral valve annulus. 3. Small right pleural effusion with basilar atelectasis. 4. Peribronchial changes with bronchiectasis likely representing chronic bronchitis. Mild emphysematous changes in the lungs. 5. Abdominal vascular variations including duplex right renal artery, triplex left renal artery, and high origin of the internal/external iliac arteries bilaterally. 6. No bowel obstruction or inflammation. 7. 11 mm cystic lesion in the tail of the pancreas, increasing in size since previous study. Recommend follow up pre and post contrast MRI/MRCP or pancreatic protocol CT in 2 years. This recommendation follows ACR consensus guidelines: Management of Incidental Pancreatic Cysts: A White Paper of the ACR Incidental Findings Committee. J Am Coll Radiol 2017;14:911-923. Electronically Signed   By: Elsie Gravely M.D.   On: 07/20/2024 17:46   CT ANGIO CHEST AORTA W/CM & OR WO/CM  Result Date: 07/20/2024 CLINICAL DATA:  Aortic valve replacement TAVR preoperative evaluation. EXAM: CT ANGIOGRAPHY CHEST, ABDOMEN AND PELVIS TECHNIQUE: Multidetector CT imaging through the  chest, abdomen and pelvis was performed using the standard protocol during bolus administration of intravenous contrast. Multiplanar reconstructed images and MIPs were obtained and reviewed to evaluate the vascular anatomy. RADIATION DOSE REDUCTION: This exam was performed according to the departmental dose-optimization program which includes automated exposure control, adjustment of the mA and/or kV according to patient size and/or use of iterative reconstruction technique. CONTRAST:  OMNIPAQUE  IOHEXOL  350 MG/ML SOLN COMPARISON:  CT abdomen pelvis 01/25/2022 FINDINGS: CTA CHEST FINDINGS Cardiovascular: Normal caliber thoracic aorta. No aortic dissection. Calcification in the aortic valve, thoracic aorta, and coronary arteries. Great vessel origins are patent. Cardiac enlargement. No pericardial effusions. Calcification in the mitral valve annulus. Mediastinum/Nodes: Thyroid  gland is unremarkable. Esophagus is decompressed. Lymph nodes demonstrated throughout the mediastinum without pathologic enlargement, likely reactive. Lungs/Pleura: Small right pleural effusion with basilar atelectasis. Peribronchial thickening and scarring most prominent in the right hilar region with mild bronchiectasis in the lung bases. No mucous plugging or focal lesions identified. This likely represents chronic bronchitis. Mild emphysematous changes in the lungs. No pneumothorax. Musculoskeletal: Degenerative changes in the spine. No acute bony abnormalities. Review of the MIP images confirms the above findings. CTA ABDOMEN AND PELVIS FINDINGS VASCULAR Aorta: Normal caliber aorta without aneurysm, dissection, vasculitis or significant stenosis. Diffuse aortic calcification and tortuosity. Celiac: Patent without evidence of aneurysm, dissection, vasculitis or significant stenosis. SMA: Patent without evidence of aneurysm, dissection, vasculitis or significant stenosis. Renals: Duplicated right and triplicated left renal arteries are  patent without evidence of aneurysm, dissection, vasculitis, fibromuscular dysplasia or significant stenosis. 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 Inflow: Very short bilateral common iliac arteries with high bifurcation into internal and external iliac arteries bilaterally. Tortuous external iliac arteries. Diffuse calcification. No aneurysm, dissection, or critical stenosis. Veins: No obvious venous abnormality within the limitations of this arterial phase study. Review of the MIP images confirms the above findings. NON-VASCULAR Hepatobiliary: No focal liver abnormality is seen. No gallstones, gallbladder wall thickening, or biliary dilatation. Pancreas: Atrophic pancreatic parenchyma. No ductal dilatation. Cyst in the tail of the pancreas measuring 11 mm diameter. This is enlarging since the previous study. No peripancreatic infiltration. Spleen: Normal in size without focal abnormality. Adrenals/Urinary Tract: No adrenal gland nodules. Kidneys are symmetrical with symmetrical nephrograms. No hydronephrosis or hydroureter. Bladder is normal. Stomach/Bowel: Stomach, small bowel, and colon are not abnormally distended. Diffusely stool-filled colon. No wall thickening or inflammatory stranding are identified. Appendix is not identified. Lymphatic: No significant lymphadenopathy. Reproductive: Prostate gland is enlarged. Other: Mild diffuse soft tissue edema in the subcutaneous fat. No free air or free fluid in the abdomen. Abdominal wall musculature appears intact. Musculoskeletal: Lumbar scoliosis convex towards the right. Diffuse degenerative change in the lumbar spine. No acute bony abnormalities. Review of the MIP images confirms the above findings. IMPRESSION: 1. Diffuse aortic atherosclerosis. Calcification of the aortic valve. No aneurysm or dissection involving the thoracic or abdominal  aorta. 2. Mild cardiac enlargement with calcification of the mitral valve annulus. 3. Small right pleural effusion with basilar atelectasis. 4. Peribronchial changes with bronchiectasis likely representing chronic bronchitis. Mild emphysematous changes in the lungs. 5. Abdominal vascular variations including duplex right renal artery, triplex left renal artery, and high origin of the internal/external iliac arteries bilaterally. 6.  No bowel obstruction or inflammation. 7. 11 mm cystic lesion in the tail of the pancreas, increasing in size since previous study. Recommend follow up pre and post contrast MRI/MRCP or pancreatic protocol CT in 2 years. This recommendation follows ACR consensus guidelines: Management of Incidental Pancreatic Cysts: A White Paper of the ACR Incidental Findings Committee. J Am Coll Radiol 2017;14:911-923. Electronically Signed   By: Elsie Gravely M.D.   On: 07/20/2024 17:46   CT CORONARY MORPH W/CTA COR W/SCORE W/CA W/CM &/OR WO/CM Addendum Date: 07/20/2024 ADDENDUM REPORT: 07/20/2024 17:30 EXAM: OVER-READ INTERPRETATION CT CHEST The following report is an over-read performed by radiologist Dr. Rogelia Myers of [facility] on 07/20/2024. This over-read does not include interpretation of cardiac or coronary anatomy or pathology. The coronary CTA interpretation by the cardiologist is attached. COMPARISON:  July 15, 2024 FINDINGS: Pulmonary Embolism: No pulmonary embolism. Cardiovascular: Mild cardiomegaly with biatrial predominance. Trace pericardial effusion. Mediastinum/Nodes: No mediastinal mass. No mediastinal or hilar lymphadenopathy. Normal esophagus. Lungs/Pleura: The midline trachea and bronchi are patent. Small to moderate volume right pleural effusion, partially visualized, with right basilar atelectasis. No pneumothorax or left pleural effusion. Fibrolinear scarring in the lingula and left lower lobe. Musculoskeletal: No acute fracture or destructive bone  lesion. Osteopenia. Multilevel degenerative disc disease of the spine. Thoracic DISH. Upper Abdomen: No acute abnormality in the partially visualized upper abdomen. IMPRESSION: Small to moderate volume right pleural effusion, partially visualized, with right basilar atelectasis. Electronically Signed   By: Rogelia Myers M.D.   On: 07/20/2024 17:30   Result Date: 07/20/2024 CLINICAL DATA:  Aortic valve replacement (TAVR), pre-op eval EXAM: Cardiac TAVR CT TECHNIQUE: The patient was scanned on a Siemens Force 192 slice scanner. A 120 kV retrospective scan was triggered in the descending thoracic aorta at 111 HU's. Gantry rotation speed was 270 msecs and collimation was .9 mm. The 3D data set was reconstructed in 5% intervals of the R-R cycle. Systolic and diastolic phases were analyzed on a dedicated work station using MPR, MIP and VRT modes. The patient received 100mL OMNIPAQUE  IOHEXOL  350 MG/ML SOLN of contrast. FINDINGS: Motion artifact. Aortic Valve: Tricuspid aortic valve with severely reduced cusp excursion. Severely thickened and severely calcified aortic valve cusps. AV calcium score: 3343 Virtual Basal Annulus Measurements: Phase assessed 35%. Maximum/Minimum Diameter: 27.9 x 23 mm Perimeter: 78.7 mm Area:  473 mm2 Mild LVOT calcifications. Membranous septal length: 5.9 mm Based on these measurements, the annulus would be suitable for a 26 mm Sapien 3 valve. Alternatively, Heart Team can consider 29 mm Evolut valve. Recommend Heart Team discussion for valve selection. Sinus of Valsalva Measurements: Non-coronary:  36 mm Right - coronary:  34 mm Left - coronary:  35 mm Coronary height and sinus of Valsalva Height: Left main: 17.5 mm, Left sinus: 22.6 mm Right coronary: 18.6 mm, Right sinus: 20.1 mm Aorta: Conventional 3 vessel branch pattern of aortic arch. Sinotubular Junction:  30 mm Ascending Thoracic Aorta:  36 mm Aortic Arch:  25 mm Descending Thoracic Aorta:  25 mm Coronary Arteries: Normal coronary  origin. Right dominance. The study was performed without use of NTG and insufficient for plaque evaluation. Coronary artery calcium seen in 3 vessel distribution. Optimum Fluoroscopic Angle for Delivery: LAO 4 CAU 5 OTHER: Atria: Biatrial dilation Left atrial appendage: No thrombus, slow flowing contrast. Mitral valve: Grossly normal, mild-moderate mitral annular calcifications. Pulmonary artery: Mildly dilated. Pulmonary veins: Normal anatomy. IMPRESSION: 1. Tricuspid aortic valve with severely reduced cusp excursion. Severely thickened and  severely calcified aortic valve cusps. 2. Aortic valve calcium score: 3343 3. Annulus area: 473 mm2, suitable for 26 mm Sapien 3 valve. Mild LVOT calcifications. Membranous septal length 5.9 mm. 4. Sufficient coronary artery heights from annulus. 5. Optimum fluoroscopic angle for delivery:  LAO 4 CAU 5 Electronically Signed: By: Soyla Merck M.D. On: 07/20/2024 12:26   DG Orthopantogram Result Date: 07/19/2024 CLINICAL DATA:  Poor dentition.  Possible TAVR. EXAM: ORTHOPANTOGRAM/PANORAMIC COMPARISON:  None Available. FINDINGS: Numerous prior tooth extractions with dental reconstructions. Persistent left mandibular third molar demonstrated within the bone, possibly impacted. Dental caries are demonstrated in multiple remaining teeth. No definite bone erosion or focal bone lesion. IMPRESSION: Multiple tooth extractions, dental reconstructions, and dental caries demonstrated. Residual left mandibular third molar may be impacted. Electronically Signed   By: Elsie Gravely M.D.   On: 07/19/2024 20:02   CARDIAC CATHETERIZATION Result Date: 07/18/2024   Prox Cx lesion is 100% stenosed.   Mid LAD lesion is 90% stenosed. 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 Discussed with Dr. Mady, would plan for ideally staged PCI prior to TAVR Mildly elevated filling pressures Normal cardiac index by assumed Fick RECOMMENDATIONS: Discuss in structural conference   DG CHEST PORT 1 VIEW Result Date: 07/15/2024 CLINICAL DATA:  PICC line placement. EXAM: PORTABLE CHEST 1 VIEW COMPARISON:  07/14/2024 FINDINGS: Low volume film. The cardio pericardial silhouette is enlarged. There is pulmonary vascular congestion without overt pulmonary edema. Minimal basilar atelectasis noted with probable small right effusion. Right PICC line tip overlies the region of the SVC/RA junction. Telemetry leads overlie the chest. IMPRESSION: 1. Right PICC line tip overlies the region of the SVC/RA junction. 2. Low volume film with vascular congestion and probable small right effusion. Electronically Signed   By: Camellia Candle M.D.   On: 07/15/2024 12:16   US  EKG SITE RITE Result Date: 07/15/2024 If Site Rite image not attached, placement could not be confirmed due to current cardiac rhythm.  US  EKG SITE RITE Result Date: 07/15/2024 If Site Rite image not attached, placement could not be confirmed due to current cardiac rhythm.  DG Chest 1 View Result Date: 07/14/2024 EXAM: 1 VIEW(S) XRAY OF THE CHEST 07/14/2024 11:47:42 AM COMPARISON: 07/12/2024 CLINICAL HISTORY: Follow-up exam [id number]. Acute on chronic systolic CHF (congestive heart failure) (HCC). FINDINGS: LUNGS AND PLEURA: Stable moderate severity diffusely increased interstitial lung markings are seen with mild areas of atelectasis and scarring noted within the bilateral lung bases. There is a small stable right pleural effusion versus pleural thickening. No pneumothorax is identified. HEART AND MEDIASTINUM: The cardiac silhouette is mildly enlarged and unchanged in size. BONES AND SOFT TISSUES: No acute osseous abnormality. IMPRESSION: 1. Stable moderate interstitial  lung markings with mild basilar atelectasis/scarring, unchanged. 2. Small right pleural effusion versus pleural thickening, stable. 3. Mild cardiomegaly, unchanged. Electronically signed by: [facility] MD 07/14/2024 02:21 PM EDT RP Workstation: [id number]   ECHOCARDIOGRAM COMPLETE Result Date: 07/13/2024    ECHOCARDIOGRAM REPORT   Patient Name:   [name] Date of Exam: 07/13/2024 Medical Rec #:  [id number]   Height:  66.0 in Accession #:    [id number]  Weight:       182.1 lb Date of Birth:  05/10/1941    BSA:          1.922 m Patient Age:    83 years    BP:           135/83 mmHg Patient Gender: M           HR:           62 bpm. Exam Location:  Inpatient Procedure: 2D Echo, Cardiac Doppler, Color Doppler and Intracardiac            Opacification Agent (Both Spectral and Color Flow Doppler were            utilized during procedure). Indications:    CHF-Acute Systolic I50.21  History:        Patient has no prior history of Echocardiogram examinations.                 CHF, CAD, CKD, stage 3; Risk Factors:Hypertension, Dyslipidemia,                 Sleep Apnea and Diabetes.  Sonographer:    Thea Norlander RCS Referring Phys: [id number] MARSA NOVAK MELVIN IMPRESSIONS  1. No left ventricular thrombus is seen (Definity contrast was used). Although there is global hypokinesis, there is disproportionately severe hypokinesis/akinesis in the mid-apical left anterior and anteroseptal walls. Left ventricular ejection fraction, by estimation, is 30 to 35%. The left ventricle has moderately decreased function. The left ventricle demonstrates regional wall motion abnormalities (see scoring diagram/findings for description). Left ventricular diastolic parameters are consistent with Grade II diastolic dysfunction (pseudonormalization). Elevated left atrial pressure. There is the interventricular septum is flattened in systole, consistent with right ventricular pressure overload.  2. Right ventricular systolic function is  moderately reduced. The right ventricular size is mildly enlarged. Tricuspid regurgitation signal is inadequate for assessing PA pressure.  3. Left atrial size was mildly dilated.  4. Right atrial size was mildly dilated.  5. The mitral valve is degenerative. Mild mitral valve regurgitation. No evidence of mitral stenosis. The mean mitral valve gradient is 3.0 mmHg with average heart rate of 66 bpm. Moderate to severe mitral annular calcification.  6. The aortic valve is tricuspid. There is severe calcifcation of the aortic valve. There is severe thickening of the aortic valve. Aortic valve regurgitation is trivial. Severe aortic valve stenosis.  7. The inferior vena cava is dilated in size with <50% respiratory variability, suggesting right atrial pressure of 15 mmHg. FINDINGS  Left Ventricle: No left ventricular thrombus is seen (Definity contrast was used). Although there is global hypokinesis, there is disproportionately severe hypokinesis/akinesis in the mid-apical left anterior and anteroseptal walls. Left ventricular ejection fraction, by estimation, is 30 to 35%. The left ventricle has moderately decreased function. The left ventricle demonstrates regional wall motion abnormalities. Definity contrast agent was given IV to delineate the left ventricular endocardial borders. The left ventricular internal cavity size was normal in size. There is borderline concentric left ventricular hypertrophy. The interventricular septum is flattened in systole, consistent with right ventricular pressure overload. Left ventricular  diastolic parameters are consistent with Grade II diastolic dysfunction (pseudonormalization). Elevated left atrial pressure.  LV Wall Scoring: The mid and distal anterior wall and entire apex are akinetic. The antero-lateral wall, anterior septum, inferior wall, posterior wall, mid inferoseptal segment, basal anterior segment, and basal inferoseptal segment are hypokinetic. Right Ventricle: The  right ventricular size  is mildly enlarged. No increase in right ventricular wall thickness. Right ventricular systolic function is moderately reduced. Tricuspid regurgitation signal is inadequate for assessing PA pressure. Left Atrium: Left atrial size was mildly dilated. Right Atrium: Right atrial size was mildly dilated. Pericardium: There is no evidence of pericardial effusion. Mitral Valve: The mitral valve is degenerative in appearance. Moderate to severe mitral annular calcification. Mild mitral valve regurgitation. No evidence of mitral valve stenosis. MV peak gradient, 9.4 mmHg. The mean mitral valve gradient is 3.0 mmHg with average heart rate of 66 bpm. Tricuspid Valve: The tricuspid valve is normal in structure. Tricuspid valve regurgitation is mild. Aortic Valve: The aortic valve is tricuspid. There is severe calcifcation of the aortic valve. There is severe thickening of the aortic valve. Aortic valve regurgitation is trivial. Severe aortic stenosis is present. Aortic valve mean gradient measures 33.0 mmHg. Aortic valve peak gradient measures 61.8 mmHg. Aortic valve area, by VTI measures 0.77 cm. Pulmonic Valve: The pulmonic valve was normal in structure. Pulmonic valve regurgitation is mild. No evidence of pulmonic stenosis. Aorta: The aortic root and ascending aorta are structurally normal, with no evidence of dilitation. Venous: The inferior vena cava is dilated in size with less than 50% respiratory variability, suggesting right atrial pressure of 15 mmHg. IAS/Shunts: No atrial level shunt detected by color flow Doppler.  LEFT VENTRICLE PLAX 2D LVIDd:         4.92 cm   Diastology LVIDs:         4.16 cm   LV e' medial:    3.00 cm/s LV PW:         1.14 cm   LV E/e' medial:  43.0 LV IVS:        1.20 cm   LV e' lateral:   5.81 cm/s LVOT diam:     2.26 cm   LV E/e' lateral: 22.2 LV SV:         79 LV SV Index:   41 LVOT Area:     4.01 cm  RIGHT VENTRICLE            IVC RV S prime:     6.87 cm/s  IVC  diam: 2.60 cm TAPSE (M-mode): 1.5 cm LEFT ATRIUM             Index        RIGHT ATRIUM           Index LA diam:        4.33 cm 2.25 cm/m   RA Area:     21.60 cm LA Vol (A2C):   57.2 ml 29.77 ml/m  RA Volume:   66.10 ml  34.40 ml/m LA Vol (A4C):   42.1 ml 21.91 ml/m LA Biplane Vol: 54.6 ml 28.41 ml/m  AORTIC VALVE AV Area (Vmax):    0.75 cm AV Area (Vmean):   0.72 cm AV Area (VTI):     0.77 cm AV Vmax:           393.00 cm/s AV Vmean:          265.500 cm/s AV VTI:            1.020 m AV Peak Grad:      61.8 mmHg AV Mean Grad:      33.0 mmHg LVOT Vmax:         73.90 cm/s LVOT Vmean:        47.700 cm/s LVOT VTI:          0.197 m  LVOT/AV VTI ratio: 0.19  AORTA Ao Root diam: 3.79 cm Ao Asc diam:  3.65 cm MITRAL VALVE MV Area (PHT): 3.85 cm     SHUNTS MV Area VTI:   2.05 cm     Systemic VTI:  0.20 m MV Peak grad:  9.4 mmHg     Systemic Diam: 2.26 cm MV Mean grad:  3.0 mmHg MV Vmax:       1.53 m/s MV Vmean:      77.0 cm/s MV Decel Time: 197 msec MV E velocity: 129.00 cm/s MV A velocity: 84.40 cm/s MV E/A ratio:  1.53 Mihai Croitoru MD Electronically signed by Jerel Balding MD Signature Date/Time: 07/13/2024/2:29:54 PM    Final    DG Chest 2 View Result Date: 07/12/2024 CLINICAL DATA:  Shortness of breath and edema. EXAM: CHEST - 2 VIEW COMPARISON:  01/25/2022 FINDINGS: Lungs are somewhat hypoinflated demonstrate mild bibasilar opacification compatible with small bilateral pleural effusions likely with associated basilar atelectasis. Mild hazy prominence of the central pulmonary vessels likely a degree of vascular congestion. Mild cardiomegaly. Remainder of the exam is unchanged. IMPRESSION: 1. Mild cardiomegaly with suggestion of mild vascular congestion. 2. Small bilateral pleural effusions with associated bibasilar atelectasis. Electronically Signed   By: Toribio Agreste M.D.   On: 07/12/2024 16:17    Microbiology: Recent Results (from the past 240 hours)  Resp panel by RT-PCR (RSV, Flu A&B, Covid)  Anterior Nasal Swab     Status: None   Collection Time: 07/12/24  3:23 PM   Specimen: Anterior Nasal Swab  Result Value Ref Range Status   SARS Coronavirus 2 by RT PCR NEGATIVE NEGATIVE Final   Influenza A by PCR NEGATIVE NEGATIVE Final   Influenza B by PCR NEGATIVE NEGATIVE Final    Comment: (NOTE) The Xpert Xpress SARS-CoV-2/FLU/RSV plus assay is intended as an aid in the diagnosis of influenza from Nasopharyngeal swab specimens and should not be used as a sole basis for treatment. Nasal washings and aspirates are unacceptable for Xpert Xpress SARS-CoV-2/FLU/RSV testing.  Fact Sheet for Patients: BloggerCourse.com  Fact Sheet for Healthcare Providers: SeriousBroker.it  This test is not yet approved or cleared by the [place]  FDA and has been authorized for detection and/or diagnosis of SARS-CoV-2 by FDA under an Emergency Use Authorization (EUA). This EUA will remain in effect (meaning this test can be used) for the duration of the COVID-19 declaration under Section 564(b)(1) of the Act, 21 U.S.C. section 360bbb-3(b)(1), unless the authorization is terminated or revoked.     Resp Syncytial Virus by PCR NEGATIVE NEGATIVE Final    Comment: (NOTE) Fact Sheet for Patients: BloggerCourse.com  Fact Sheet for Healthcare Providers: SeriousBroker.it  This test is not yet approved or cleared by the [place]  FDA and has been authorized for detection and/or diagnosis of SARS-CoV-2 by FDA under an Emergency Use Authorization (EUA). This EUA will remain in effect (meaning this test can be used) for the duration of the COVID-19 declaration under Section 564(b)(1) of the Act, 21 U.S.C. section 360bbb-3(b)(1), unless the authorization is terminated or revoked.  Performed at [facility], [address], [place], [place] [address]      Labs: Basic Metabolic  Panel: Recent Labs  Lab 07/16/24 0500 07/17/24 0820 07/18/24 0500 07/18/24 1135 07/18/24 1251 07/18/24 1252 07/19/24 0430 07/20/24 0510 07/21/24 0440  NA 135 135 134*   < > 134* 133* 133* 133* 134*  K 4.8 4.1 4.0   < > 4.3 4.4 4.6 4.0 4.2  CL 94* 88* 92*  --   --   --  93* 91* 93*  CO2 32 36* 32  --   --   --  32 29 29  GLUCOSE 121* 105* 80  --   --   --  193* 106* 130*  BUN 45* 41* 45*  --   --   --  50* 53* 48*  CREATININE 1.63* 1.43* 1.53*  --   --   --  1.61* 1.34* 1.53*  CALCIUM 9.0 9.0 9.0  --   --   --  8.7* 8.8* 8.8*  MG 1.9  --  2.0  --   --   --  2.0 2.1 2.1   < > = values in this interval not displayed.   Liver Function Tests: No results for input(s): AST, ALT, ALKPHOS, BILITOT, PROT, ALBUMIN in the last 168 hours. No results for input(s): LIPASE, AMYLASE in the last 168 hours. No results for input(s): AMMONIA in the last 168 hours. CBC: Recent Labs  Lab 07/18/24 1135 07/18/24 1251 07/18/24 1252 07/19/24 0430  WBC  --   --   --  6.3  HGB 11.9*  11.6* 15.6 16.0 11.2*  HCT 35.0*  34.0* 46.0 47.0 34.7*  MCV  --   --   --  89.0  PLT  --   --   --  223   Cardiac Enzymes: No results for input(s): CKTOTAL, CKMB, CKMBINDEX, TROPONINI in the last 168 hours. BNP: BNP (last 3 results) Recent Labs    07/12/24 1530  BNP 4,159.6*    ProBNP (last 3 results) No results for input(s): PROBNP in the last 8760 hours.  CBG: Recent Labs  Lab 07/20/24 0609 07/20/24 1219 07/20/24 1456 07/20/24 2117 07/21/24 0602  GLUCAP 81 89 91 119* 144*       Signed:  Sigurd Pac MD.  [facility] 07/21/2024, 11:49 AM

## 2024-07-21 NOTE — TOC Transition Note (Addendum)
 Transition of Care [facility]) - Discharge Note   Patient Details  Name: [name] MRN: [id number] Date of Birth: 1940-11-19  [facility] CM/SW Contact:  Justina Delcia Czar, RN Phone Number: [phone] 07/21/2024, 11:52 AM   Clinical Narrative:     Family will provide transportation home. Pt will return next week for surgery. Pt requesting Rollator be delivered to home. Wanted to check with son. States wife has RW x 2 at home.   Final next level of care: Home/Self Care Barriers to Discharge: No Barriers Identified   Patient Goals and CMS Choice Patient states their goals for this hospitalization and ongoing recovery are:: wants to remain independent CMS Medicare.gov Compare Post Acute Care list provided to:: Patient Choice offered to / list presented to : Patient [place] ownership interest in [facility].provided to:: Patient    Discharge Placement                       Discharge Plan and Services Additional resources added to the After Visit Summary for     Discharge Planning Services: CM Consult Post Acute Care Choice: Home Health                               Social Drivers of Health (SDOH) Interventions SDOH Screenings   Food Insecurity: Patient Declined (07/12/2024)  Housing: Unknown (07/12/2024)  Transportation Needs: Patient Declined (07/12/2024)  Utilities: Patient Declined (07/12/2024)  Social Connections: Patient Declined (07/12/2024)  Tobacco Use: Medium Risk (07/12/2024)     Readmission Risk Interventions     No data to display

## 2024-07-21 NOTE — Plan of Care (Signed)

## 2024-07-25 ENCOUNTER — Other Ambulatory Visit: Payer: Self-pay

## 2024-07-25 ENCOUNTER — Encounter (HOSPITAL_COMMUNITY)
Admit: 2024-07-25 | Discharge: 2024-07-25 | Disposition: A | Discharge status: Home / Self Care | Source: Ambulatory Visit | Attending: Cardiovascular Disease | Admitting: Cardiovascular Disease

## 2024-07-25 ENCOUNTER — Encounter (HOSPITAL_COMMUNITY): Payer: Self-pay

## 2024-07-25 DIAGNOSIS — Z952 Presence of prosthetic heart valve: Secondary | ICD-10-CM | POA: Diagnosis not present

## 2024-07-25 DIAGNOSIS — I5082 Biventricular heart failure: Secondary | ICD-10-CM | POA: Diagnosis present

## 2024-07-25 DIAGNOSIS — N400 Enlarged prostate without lower urinary tract symptoms: Secondary | ICD-10-CM | POA: Diagnosis present

## 2024-07-25 DIAGNOSIS — I350 Nonrheumatic aortic (valve) stenosis: Secondary | ICD-10-CM | POA: Diagnosis present

## 2024-07-25 DIAGNOSIS — Z452 Encounter for adjustment and management of vascular access device: Secondary | ICD-10-CM | POA: Diagnosis not present

## 2024-07-25 DIAGNOSIS — E785 Hyperlipidemia, unspecified: Secondary | ICD-10-CM | POA: Diagnosis present

## 2024-07-25 DIAGNOSIS — I10 Essential (primary) hypertension: Secondary | ICD-10-CM | POA: Diagnosis not present

## 2024-07-25 DIAGNOSIS — Z01818 Encounter for other preprocedural examination: Secondary | ICD-10-CM

## 2024-07-25 DIAGNOSIS — Z7982 Long term (current) use of aspirin: Secondary | ICD-10-CM | POA: Diagnosis not present

## 2024-07-25 DIAGNOSIS — G4730 Sleep apnea, unspecified: Secondary | ICD-10-CM | POA: Diagnosis present

## 2024-07-25 DIAGNOSIS — Z006 Encounter for examination for normal comparison and control in clinical research program: Secondary | ICD-10-CM | POA: Diagnosis not present

## 2024-07-25 DIAGNOSIS — I2510 Atherosclerotic heart disease of native coronary artery without angina pectoris: Secondary | ICD-10-CM | POA: Diagnosis present

## 2024-07-25 DIAGNOSIS — I252 Old myocardial infarction: Secondary | ICD-10-CM | POA: Diagnosis not present

## 2024-07-25 DIAGNOSIS — Z833 Family history of diabetes mellitus: Secondary | ICD-10-CM | POA: Diagnosis not present

## 2024-07-25 DIAGNOSIS — Z79899 Other long term (current) drug therapy: Secondary | ICD-10-CM | POA: Diagnosis not present

## 2024-07-25 DIAGNOSIS — I4510 Unspecified right bundle-branch block: Secondary | ICD-10-CM | POA: Diagnosis present

## 2024-07-25 DIAGNOSIS — Z950 Presence of cardiac pacemaker: Secondary | ICD-10-CM | POA: Diagnosis not present

## 2024-07-25 DIAGNOSIS — Z01812 Encounter for preprocedural laboratory examination: Secondary | ICD-10-CM | POA: Insufficient documentation

## 2024-07-25 DIAGNOSIS — E1122 Type 2 diabetes mellitus with diabetic chronic kidney disease: Secondary | ICD-10-CM | POA: Diagnosis present

## 2024-07-25 DIAGNOSIS — I130 Hypertensive heart and chronic kidney disease with heart failure and stage 1 through stage 4 chronic kidney disease, or unspecified chronic kidney disease: Secondary | ICD-10-CM | POA: Diagnosis present

## 2024-07-25 DIAGNOSIS — I442 Atrioventricular block, complete: Secondary | ICD-10-CM | POA: Diagnosis present

## 2024-07-25 DIAGNOSIS — K862 Cyst of pancreas: Secondary | ICD-10-CM | POA: Diagnosis present

## 2024-07-25 DIAGNOSIS — Z8249 Family history of ischemic heart disease and other diseases of the circulatory system: Secondary | ICD-10-CM | POA: Diagnosis not present

## 2024-07-25 DIAGNOSIS — F319 Bipolar disorder, unspecified: Secondary | ICD-10-CM | POA: Diagnosis present

## 2024-07-25 DIAGNOSIS — N1832 Chronic kidney disease, stage 3b: Secondary | ICD-10-CM | POA: Diagnosis present

## 2024-07-25 DIAGNOSIS — E039 Hypothyroidism, unspecified: Secondary | ICD-10-CM | POA: Diagnosis present

## 2024-07-25 DIAGNOSIS — Z87891 Personal history of nicotine dependence: Secondary | ICD-10-CM | POA: Diagnosis not present

## 2024-07-25 DIAGNOSIS — R0989 Other specified symptoms and signs involving the circulatory and respiratory systems: Secondary | ICD-10-CM | POA: Diagnosis not present

## 2024-07-25 DIAGNOSIS — I5023 Acute on chronic systolic (congestive) heart failure: Secondary | ICD-10-CM | POA: Diagnosis present

## 2024-07-25 DIAGNOSIS — Z79890 Hormone replacement therapy (postmenopausal): Secondary | ICD-10-CM | POA: Diagnosis not present

## 2024-07-25 DIAGNOSIS — I255 Ischemic cardiomyopathy: Secondary | ICD-10-CM | POA: Diagnosis present

## 2024-07-25 HISTORY — DX: Nonrheumatic aortic (valve) stenosis: I35.0

## 2024-07-25 LAB — COMPREHENSIVE METABOLIC PANEL WITH GFR
ALT: 32 U/L (ref 0–44)
AST: 31 U/L (ref 15–41)
Albumin: 3.6 g/dL (ref 3.5–5.0)
Alkaline Phosphatase: 97 U/L (ref 38–126)
Anion gap: 9 (ref 5–15)
BUN: 46 mg/dL — ABNORMAL HIGH (ref 8–23)
CO2: 25 mmol/L (ref 22–32)
Calcium: 9.2 mg/dL (ref 8.9–10.3)
Chloride: 105 mmol/L (ref 98–111)
Creatinine, Ser: 1.34 mg/dL — ABNORMAL HIGH (ref 0.61–1.24)
GFR, Estimated: 52 mL/min — ABNORMAL LOW (ref >60)
Glucose, Bld: 127 mg/dL — ABNORMAL HIGH (ref 70–99)
Potassium: 4.4 mmol/L (ref 3.5–5.1)
Sodium: 139 mmol/L (ref 135–145)
Total Bilirubin: 0.9 mg/dL (ref 0.0–1.2)
Total Protein: 7.2 g/dL (ref 6.5–8.1)

## 2024-07-25 LAB — CBC
HCT: 38.3 % — ABNORMAL LOW (ref 39.0–52.0)
Hemoglobin: 12.1 g/dL — ABNORMAL LOW (ref 13.0–17.0)
MCH: 28.9 pg (ref 26.0–34.0)
MCHC: 31.6 g/dL (ref 30.0–36.0)
MCV: 91.6 fL (ref 80.0–100.0)
Platelets: 211 K/uL (ref 150–400)
RBC: 4.18 MIL/uL — ABNORMAL LOW (ref 4.22–5.81)
RDW: 15.1 % (ref 11.5–15.5)
WBC: 8.3 K/uL (ref 4.0–10.5)
nRBC: 0 % (ref 0.0–0.2)

## 2024-07-25 LAB — URINALYSIS, ROUTINE W REFLEX MICROSCOPIC
Bacteria, UA: NONE SEEN
Bilirubin Urine: NEGATIVE
Glucose, UA: >=500 mg/dL — AB
Hgb urine dipstick: NEGATIVE
Ketones, ur: NEGATIVE mg/dL
Leukocytes,Ua: NEGATIVE
Nitrite: NEGATIVE
Protein, ur: 100 mg/dL — AB
Specific Gravity, Urine: 1.022 (ref 1.005–1.030)
pH: 5 (ref 5.0–8.0)

## 2024-07-25 LAB — PROTIME-INR
INR: 1.2 (ref 0.8–1.2)
Prothrombin Time: 15.4 s — ABNORMAL HIGH (ref 11.4–15.2)

## 2024-07-25 LAB — SURGICAL PCR SCREEN
MRSA, PCR: NEGATIVE
Staphylococcus aureus: NEGATIVE

## 2024-07-25 LAB — TYPE AND SCREEN
ABO/RH(D): O NEG
Antibody Screen: NEGATIVE

## 2024-07-25 MED ORDER — HEPARIN 30,000 UNITS/1000 ML (OHS) CELLSAVER SOLUTION
Status: DC
  Filled 2024-07-25: qty 1000

## 2024-07-25 MED ORDER — MAGNESIUM SULFATE 50 % IJ SOLN
40.0000 meq | INTRAMUSCULAR | Status: DC
  Filled 2024-07-25: qty 9.85

## 2024-07-25 MED ORDER — CEFAZOLIN SODIUM-DEXTROSE 2-4 GM/100ML-% IV SOLN
2.0000 g | INTRAVENOUS | Status: AC
  Administered 2024-07-26: 2 g via INTRAVENOUS
  Filled 2024-07-25: qty 100

## 2024-07-25 MED ORDER — NOREPINEPHRINE 4 MG/250ML-% IV SOLN
0.0000 ug/min | INTRAVENOUS | Status: AC
  Administered 2024-07-26: 2 ug/min via INTRAVENOUS
  Filled 2024-07-25: qty 250

## 2024-07-25 MED ORDER — POTASSIUM CHLORIDE 2 MEQ/ML IV SOLN
80.0000 meq | INTRAVENOUS | Status: DC
  Filled 2024-07-25: qty 40

## 2024-07-25 MED ORDER — DEXMEDETOMIDINE HCL IN NACL 400 MCG/100ML IV SOLN
0.1000 ug/kg/h | INTRAVENOUS | Status: AC
  Administered 2024-07-26: 1 ug/kg/h via INTRAVENOUS
  Filled 2024-07-25: qty 100

## 2024-07-25 NOTE — Progress Notes (Signed)
 All consents signed by patient at PAT lab appointment. Pt was sent home with printed copy of surgical instructions and CHG soap/CHG soap instructions. All instructions reviewed with patient and questions answered.  Patients chart send to anesthesia for review. Pt denies any respiratory illness/infection in the last two months.

## 2024-07-26 ENCOUNTER — Encounter (HOSPITAL_COMMUNITY)
Admission: RE | Disposition: A | Discharge status: Home / Self Care | Payer: Self-pay | Source: Home / Self Care | Attending: Cardiovascular Disease

## 2024-07-26 ENCOUNTER — Other Ambulatory Visit: Payer: Self-pay

## 2024-07-26 ENCOUNTER — Inpatient Hospital Stay (HOSPITAL_COMMUNITY)
Admission: RE | Admit: 2024-07-26 | Discharge: 2024-07-29 | DRG: 266 | Disposition: A | Discharge status: Home Health | Attending: Cardiovascular Disease | Admitting: Cardiovascular Disease

## 2024-07-26 ENCOUNTER — Inpatient Hospital Stay (HOSPITAL_COMMUNITY): Payer: Self-pay | Admitting: Certified Registered Nurse Anesthetist

## 2024-07-26 ENCOUNTER — Other Ambulatory Visit: Payer: Self-pay | Admitting: Physician Assistant

## 2024-07-26 ENCOUNTER — Encounter (HOSPITAL_COMMUNITY): Payer: Self-pay | Admitting: Cardiovascular Disease

## 2024-07-26 ENCOUNTER — Inpatient Hospital Stay (HOSPITAL_COMMUNITY)

## 2024-07-26 DIAGNOSIS — I255 Ischemic cardiomyopathy: Secondary | ICD-10-CM | POA: Diagnosis present

## 2024-07-26 DIAGNOSIS — I350 Nonrheumatic aortic (valve) stenosis: Secondary | ICD-10-CM

## 2024-07-26 DIAGNOSIS — I442 Atrioventricular block, complete: Secondary | ICD-10-CM | POA: Diagnosis present

## 2024-07-26 DIAGNOSIS — I4510 Unspecified right bundle-branch block: Secondary | ICD-10-CM | POA: Diagnosis present

## 2024-07-26 DIAGNOSIS — Z8249 Family history of ischemic heart disease and other diseases of the circulatory system: Secondary | ICD-10-CM

## 2024-07-26 DIAGNOSIS — E039 Hypothyroidism, unspecified: Secondary | ICD-10-CM | POA: Diagnosis present

## 2024-07-26 DIAGNOSIS — Z87891 Personal history of nicotine dependence: Secondary | ICD-10-CM

## 2024-07-26 DIAGNOSIS — I5023 Acute on chronic systolic (congestive) heart failure: Secondary | ICD-10-CM | POA: Diagnosis present

## 2024-07-26 DIAGNOSIS — I5082 Biventricular heart failure: Secondary | ICD-10-CM | POA: Diagnosis present

## 2024-07-26 DIAGNOSIS — Z952 Presence of prosthetic heart valve: Secondary | ICD-10-CM

## 2024-07-26 DIAGNOSIS — E785 Hyperlipidemia, unspecified: Secondary | ICD-10-CM | POA: Diagnosis present

## 2024-07-26 DIAGNOSIS — I2510 Atherosclerotic heart disease of native coronary artery without angina pectoris: Secondary | ICD-10-CM

## 2024-07-26 DIAGNOSIS — I252 Old myocardial infarction: Secondary | ICD-10-CM

## 2024-07-26 DIAGNOSIS — N1832 Chronic kidney disease, stage 3b: Secondary | ICD-10-CM | POA: Diagnosis present

## 2024-07-26 DIAGNOSIS — Z79890 Hormone replacement therapy (postmenopausal): Secondary | ICD-10-CM | POA: Diagnosis not present

## 2024-07-26 DIAGNOSIS — F319 Bipolar disorder, unspecified: Secondary | ICD-10-CM | POA: Diagnosis present

## 2024-07-26 DIAGNOSIS — Z7982 Long term (current) use of aspirin: Secondary | ICD-10-CM | POA: Diagnosis not present

## 2024-07-26 DIAGNOSIS — N400 Enlarged prostate without lower urinary tract symptoms: Secondary | ICD-10-CM | POA: Diagnosis present

## 2024-07-26 DIAGNOSIS — I130 Hypertensive heart and chronic kidney disease with heart failure and stage 1 through stage 4 chronic kidney disease, or unspecified chronic kidney disease: Secondary | ICD-10-CM | POA: Diagnosis present

## 2024-07-26 DIAGNOSIS — I2582 Chronic total occlusion of coronary artery: Secondary | ICD-10-CM | POA: Diagnosis present

## 2024-07-26 DIAGNOSIS — Z833 Family history of diabetes mellitus: Secondary | ICD-10-CM

## 2024-07-26 DIAGNOSIS — E669 Obesity, unspecified: Secondary | ICD-10-CM | POA: Diagnosis present

## 2024-07-26 DIAGNOSIS — G4730 Sleep apnea, unspecified: Secondary | ICD-10-CM | POA: Diagnosis present

## 2024-07-26 DIAGNOSIS — Z006 Encounter for examination for normal comparison and control in clinical research program: Secondary | ICD-10-CM

## 2024-07-26 DIAGNOSIS — K862 Cyst of pancreas: Secondary | ICD-10-CM | POA: Diagnosis present

## 2024-07-26 DIAGNOSIS — I10 Essential (primary) hypertension: Secondary | ICD-10-CM

## 2024-07-26 DIAGNOSIS — E1122 Type 2 diabetes mellitus with diabetic chronic kidney disease: Secondary | ICD-10-CM | POA: Diagnosis present

## 2024-07-26 DIAGNOSIS — N1831 Chronic kidney disease, stage 3a: Secondary | ICD-10-CM | POA: Diagnosis present

## 2024-07-26 DIAGNOSIS — Z79899 Other long term (current) drug therapy: Secondary | ICD-10-CM | POA: Diagnosis not present

## 2024-07-26 HISTORY — DX: Type 2 diabetes mellitus without complications: E11.9

## 2024-07-26 HISTORY — DX: Nonrheumatic aortic (valve) stenosis: I35.0

## 2024-07-26 HISTORY — DX: Hypothyroidism, unspecified: E03.9

## 2024-07-26 HISTORY — DX: Sleep apnea, unspecified: G47.30

## 2024-07-26 HISTORY — PX: INTRAOPERATIVE TRANSTHORACIC ECHOCARDIOGRAM: SHX6523

## 2024-07-26 LAB — ECHOCARDIOGRAM LIMITED
AR max vel: 3.11 cm2
AV Area VTI: 3.15 cm2
AV Area mean vel: 3.15 cm2
AV Mean grad: 2 mmHg
AV Peak grad: 4.4 mmHg
Ao pk vel: 1.05 m/s
S' Lateral: 4.9 cm

## 2024-07-26 LAB — POCT I-STAT, CHEM 8
BUN: 45 mg/dL — ABNORMAL HIGH (ref 8–23)
Calcium, Ion: 1.3 mmol/L (ref 1.15–1.40)
Chloride: 106 mmol/L (ref 98–111)
Creatinine, Ser: 1.4 mg/dL — ABNORMAL HIGH (ref 0.61–1.24)
Glucose, Bld: 110 mg/dL — ABNORMAL HIGH (ref 70–99)
HCT: 29 % — ABNORMAL LOW (ref 39.0–52.0)
Hemoglobin: 9.9 g/dL — ABNORMAL LOW (ref 13.0–17.0)
Potassium: 4.9 mmol/L (ref 3.5–5.1)
Sodium: 140 mmol/L (ref 135–145)
TCO2: 27 mmol/L (ref 22–32)

## 2024-07-26 LAB — GLUCOSE, CAPILLARY
Glucose-Capillary: 95 mg/dL (ref 70–99)
Glucose-Capillary: 90 mg/dL (ref 70–99)

## 2024-07-26 LAB — ABO/RH: ABO/RH(D): O NEG

## 2024-07-26 LAB — POCT ACTIVATED CLOTTING TIME: Activated Clotting Time: 233 s

## 2024-07-26 MED ORDER — CHLORHEXIDINE GLUCONATE 4 % EX SOLN: 30.0000 mL | CUTANEOUS | Status: DC

## 2024-07-26 MED ORDER — POTASSIUM CHLORIDE CRYS ER 20 MEQ PO TBCR
20.0000 meq | EXTENDED_RELEASE_TABLET | Freq: Once | ORAL | Status: AC
  Administered 2024-07-26: 20 meq via ORAL
  Filled 2024-07-26: qty 1

## 2024-07-26 MED ORDER — LIDOCAINE HCL (PF) 1 % IJ SOLN
INTRAMUSCULAR | Status: DC | PRN
  Administered 2024-07-26: 5 mL
  Administered 2024-07-26: 2 mL
  Administered 2024-07-26: 5 mL

## 2024-07-26 MED ORDER — PROTAMINE SULFATE 10 MG/ML IV SOLN
INTRAVENOUS | Status: DC | PRN
  Administered 2024-07-26: 120 mg via INTRAVENOUS

## 2024-07-26 MED ORDER — NOREPINEPHRINE 4 MG/250ML-% IV SOLN
0.0000 ug/min | INTRAVENOUS | Status: DC
  Administered 2024-07-26: 2 ug/min via INTRAVENOUS

## 2024-07-26 MED ORDER — SODIUM CHLORIDE 0.9 % IV SOLN: INTRAVENOUS | Status: DC

## 2024-07-26 MED ORDER — LEVOTHYROXINE SODIUM 75 MCG PO TABS
75.0000 ug | ORAL_TABLET | Freq: Every day | ORAL | Status: DC
  Administered 2024-07-27 – 2024-07-29 (×3): 75 ug via ORAL
  Filled 2024-07-26 (×3): qty 1

## 2024-07-26 MED ORDER — ACETAMINOPHEN 650 MG RE SUPP: 650.0000 mg | Freq: Four times a day (QID) | RECTAL | Status: DC | PRN

## 2024-07-26 MED ORDER — INSULIN ASPART 100 UNIT/ML IJ SOLN: 0.0000 [IU] | INTRAMUSCULAR | Status: DC | PRN

## 2024-07-26 MED ORDER — HEPARIN (PORCINE) IN NACL 1000-0.9 UT/500ML-% IV SOLN
INTRAVENOUS | Status: DC | PRN
  Administered 2024-07-26 (×3): 500 mL

## 2024-07-26 MED ORDER — LACTATED RINGERS IV SOLN: INTRAVENOUS | Status: DC | PRN

## 2024-07-26 MED ORDER — SODIUM CHLORIDE 0.9 % IV SOLN: INTRAVENOUS | Status: AC

## 2024-07-26 MED ORDER — CHLORHEXIDINE GLUCONATE 4 % EX SOLN: 60.0000 mL | Freq: Once | CUTANEOUS | Status: DC

## 2024-07-26 MED ORDER — ATORVASTATIN CALCIUM 80 MG PO TABS
80.0000 mg | ORAL_TABLET | Freq: Every day | ORAL | Status: DC
  Administered 2024-07-26 – 2024-07-29 (×3): 80 mg via ORAL
  Filled 2024-07-26 (×3): qty 1

## 2024-07-26 MED ORDER — FENTANYL CITRATE (PF) 100 MCG/2ML IJ SOLN
INTRAMUSCULAR | Status: AC
  Filled 2024-07-26: qty 2

## 2024-07-26 MED ORDER — FUROSEMIDE 10 MG/ML IJ SOLN
40.0000 mg | Freq: Once | INTRAMUSCULAR | Status: AC
  Administered 2024-07-26: 40 mg via INTRAVENOUS
  Filled 2024-07-26: qty 4

## 2024-07-26 MED ORDER — FENTANYL CITRATE (PF) 100 MCG/2ML IJ SOLN
INTRAMUSCULAR | Status: DC | PRN
  Administered 2024-07-26 (×3): 25 ug via INTRAVENOUS

## 2024-07-26 MED ORDER — ASPIRIN 81 MG PO TBEC
81.0000 mg | DELAYED_RELEASE_TABLET | Freq: Every day | ORAL | Status: DC
  Administered 2024-07-26 – 2024-07-29 (×4): 81 mg via ORAL
  Filled 2024-07-26 (×4): qty 1

## 2024-07-26 MED ORDER — OXYCODONE HCL 5 MG PO TABS: 5.0000 mg | ORAL_TABLET | ORAL | Status: DC | PRN

## 2024-07-26 MED ORDER — LIDOCAINE HCL (PF) 1 % IJ SOLN
INTRAMUSCULAR | Status: AC
  Filled 2024-07-26: qty 30

## 2024-07-26 MED ORDER — NOREPINEPHRINE BITARTRATE 1 MG/ML IV SOLN
INTRAVENOUS | Status: DC | PRN
  Administered 2024-07-26 (×2): .5 mL via INTRAVENOUS

## 2024-07-26 MED ORDER — CEFAZOLIN SODIUM-DEXTROSE 2-4 GM/100ML-% IV SOLN
2.0000 g | Freq: Three times a day (TID) | INTRAVENOUS | Status: AC
  Administered 2024-07-26 (×2): 2 g via INTRAVENOUS
  Filled 2024-07-26 (×2): qty 100

## 2024-07-26 MED ORDER — SODIUM CHLORIDE 0.9 % IV SOLN: 250.0000 mL | INTRAVENOUS | Status: DC | PRN

## 2024-07-26 MED ORDER — HEPARIN SODIUM (PORCINE) 1000 UNIT/ML IJ SOLN
INTRAMUSCULAR | Status: DC | PRN
  Administered 2024-07-26: 3000 [IU] via INTRAVENOUS
  Administered 2024-07-26: 9000 [IU] via INTRAVENOUS

## 2024-07-26 MED ORDER — LACTATED RINGERS IV SOLN: INTRAVENOUS | Status: DC

## 2024-07-26 MED ORDER — TRAMADOL HCL 50 MG PO TABS: 50.0000 mg | ORAL_TABLET | ORAL | Status: DC | PRN

## 2024-07-26 MED ORDER — DOCUSATE SODIUM 100 MG PO CAPS: 200.0000 mg | ORAL_CAPSULE | Freq: Two times a day (BID) | ORAL | Status: DC | PRN

## 2024-07-26 MED ORDER — MORPHINE SULFATE (PF) 2 MG/ML IV SOLN: 1.0000 mg | INTRAVENOUS | Status: DC | PRN

## 2024-07-26 MED ORDER — ONDANSETRON HCL 4 MG/2ML IJ SOLN: 4.0000 mg | Freq: Four times a day (QID) | INTRAMUSCULAR | Status: DC | PRN

## 2024-07-26 MED ORDER — SODIUM CHLORIDE 0.9% FLUSH: 3.0000 mL | INTRAVENOUS | Status: DC | PRN

## 2024-07-26 MED ORDER — ACETAMINOPHEN 325 MG PO TABS: 650.0000 mg | ORAL_TABLET | Freq: Four times a day (QID) | ORAL | Status: DC | PRN

## 2024-07-26 MED ORDER — CHLORHEXIDINE GLUCONATE 0.12 % MT SOLN
15.0000 mL | Freq: Once | OROMUCOSAL | Status: AC
  Administered 2024-07-26: 15 mL via OROMUCOSAL
  Filled 2024-07-26: qty 15

## 2024-07-26 MED ORDER — NITROGLYCERIN IN D5W 200-5 MCG/ML-% IV SOLN: 0.0000 ug/min | INTRAVENOUS | Status: DC

## 2024-07-26 MED ORDER — SODIUM CHLORIDE 0.9 % IV SOLN
INTRAVENOUS | Status: AC | PRN
  Administered 2024-07-26: 1000 mL via INTRAVENOUS

## 2024-07-26 MED ORDER — IOHEXOL 350 MG/ML SOLN
INTRAVENOUS | Status: DC | PRN
  Administered 2024-07-26: 30 mL

## 2024-07-26 MED ORDER — SODIUM CHLORIDE 0.9% FLUSH
3.0000 mL | Freq: Two times a day (BID) | INTRAVENOUS | Status: DC
  Administered 2024-07-26 – 2024-07-28 (×5): 3 mL via INTRAVENOUS

## 2024-07-26 NOTE — Progress Notes (Signed)
  Echocardiogram 2D Echocardiogram has been performed.  LAMON MAXWELL 07/26/2024, 9:19 AM

## 2024-07-26 NOTE — Interval H&P Note (Signed)
 History and Physical Interval Note:  07/26/2024 7:15 AM  [name]  has presented today for surgery, with the diagnosis of Severe Aortic Stenosis.  The various methods of treatment have been discussed with the patient and family. After consideration of risks, benefits and other options for treatment, the patient has consented to  Procedure(s): Transcatheter Aortic Valve Replacement, Transfemoral (N/A) ECHOCARDIOGRAM, TRANSTHORACIC (N/A) as a surgical intervention.  The patient's history has been reviewed, patient examined, no change in status, stable for surgery.  I have reviewed the patient's chart and labs.  Questions were answered to the patient's satisfaction.     [name] MALVA Rayas

## 2024-07-26 NOTE — CV Procedure (Signed)
 [facility]  TAVR OPERATIVE NOTE   Date of Procedure:  07/26/2024  Preoperative Diagnosis: Severe Aortic Stenosis   Postoperative Diagnosis: Same   Procedure:   Transcatheter Aortic Valve Replacement - Transfemoral Approach  Edwards Sapien 3 Ultra Resilia THV (size 26 mm, model # [id number], serial # [id number] )   Co-Surgeons:  Lonni Cash, MD and Linnie Rayas , MD   Anesthesiologist:  Leopoldo  Echocardiographer:  Burton  Pre-operative Echo Findings: Severe aortic stenosis Moderate left ventricular systolic dysfunction  Post-operative Echo Findings: No paravalvular leak Moderate left ventricular systolic dysfunction  BRIEF CLINICAL NOTE AND INDICATIONS FOR SURGERY  83 y.o. male with history of HTN, HLD, DM, CKD, RBBB, 1st degree AV block, hypothyroidism, sleep apnea, bipolar disorder admitted with massive volume overload. He was found to have severe biventricular heart failure and severe low flow/low gradient aortic stenosis. He was diuresed with inotropic support by the Advanced Heart Failure team. Echo with LVEF=30-35% with global HK with severe hypokinesis of the mid-apical anterior and anteroseptal walls. Severe LFLG AS with mean gradient 33 mmHg, AVA 0.77 cm2, DI 0.19, SVI 41. Evidence of RV overload. Cardiac cath with 100% Circumflex occlusion with filling from right to left collaterals and severe mid LAD stenosis.   During the course of the patient's preoperative work up they have been evaluated comprehensively by a multidisciplinary team of specialists coordinated through the Multidisciplinary Heart Valve Clinic in the [facility].  They have been demonstrated to suffer from symptomatic severe aortic stenosis as noted above. The patient has been counseled extensively as to the relative risks and benefits of all options for the treatment of severe aortic stenosis including long term medical therapy, conventional surgery for  aortic valve replacement, and transcatheter aortic valve replacement.  The patient has been independently evaluated by Dr. Rayas with CT surgery and they are felt to be at high risk for conventional surgical aortic valve replacement. The surgeon indicated the patient would be a poor candidate for conventional surgery. Based upon review of all of the patient's preoperative diagnostic tests they are felt to be candidate for transcatheter aortic valve replacement using the transfemoral approach as an alternative to high risk conventional surgery.    Following the decision to proceed with transcatheter aortic valve replacement, a discussion has been held regarding what types of management strategies would be attempted intraoperatively in the event of life-threatening complications, including whether or not the patient would be considered a candidate for the use of cardiopulmonary bypass and/or conversion to open sternotomy for attempted surgical intervention.  The patient has been advised of a variety of complications that might develop peculiar to this approach including but not limited to risks of death, stroke, paravalvular leak, aortic dissection or other major vascular complications, aortic annulus rupture, device embolization, cardiac rupture or perforation, acute myocardial infarction, arrhythmia, heart block or bradycardia requiring permanent pacemaker placement, congestive heart failure, respiratory failure, renal failure, pneumonia, infection, other late complications related to structural valve deterioration or migration, or other complications that might ultimately cause a temporary or permanent loss of functional independence or other long term morbidity.  The patient provides full informed consent for the procedure as described and all questions were answered preoperatively.    DETAILS OF THE OPERATIVE PROCEDURE  PREPARATION:   The patient is brought to the operating room on the above  mentioned date and central monitoring was established by the anesthesia team including placement of a radial arterial line. The patient  is placed in the supine position on the operating table.  Intravenous antibiotics are administered. Conscious sedation is used.   Baseline transthoracic echocardiogram was performed. The patient's chest, abdomen, both groins, and both lower extremities are prepared and draped in a sterile manner. A time out procedure is performed.   PERIPHERAL ACCESS:   Using the modified Seldinger technique, femoral arterial and venous access were obtained with placement of a 6 Fr sheath in the right femoral artery and a 6Fr sheath in the internal jugular vein on the right side using u/s guidance.  A pigtail diagnostic catheter was passed through the femoral arterial sheath under fluoroscopic guidance into the aortic root.  A temporary transvenous pacemaker catheter was passed through the venous sheath under fluoroscopic guidance into the right ventricle.  The pacemaker was tested to ensure stable lead placement and pacemaker capture. Aortic root angiography was performed in order to determine the optimal angiographic angle for valve deployment.  TRANSFEMORAL ACCESS:  A micropuncture kit was used to gain access to the left femoral artery using u/s guidance. Position confirmed with angiography. Pre-closure with double ProGlide closure devices. The patient was heparinized systemically and ACT verified > 250 seconds.    A 14 Fr transfemoral E-sheath was introduced into the left femoral artery after progressively dilating over an Amplatz superstiff wire. An AL-2 catheter was used to direct a straight-tip exchange length wire across the native aortic valve into the left ventricle. This was exchanged out for a pigtail catheter and position was confirmed in the LV apex. Simultaneous LV and Ao pressures were recorded.  The pigtail catheter was then exchanged for a Safari wire in the LV apex.    TRANSCATHETER HEART VALVE DEPLOYMENT:  An Edwards Sapien 3 THV (size 26 mm) was prepared and crimped per manufacturer's guidelines, and the proper orientation of the valve is confirmed on the [employer] delivery system. The valve was advanced through the introducer sheath using normal technique until in an appropriate position in the abdominal aorta beyond the sheath tip. The balloon was then retracted and using the fine-tuning wheel was centered on the valve. The valve was then advanced across the aortic arch using appropriate flexion of the catheter. The valve was carefully positioned across the aortic valve annulus. The Commander catheter was retracted using normal technique. Once final position of the valve has been confirmed by angiographic assessment, the valve is deployed while temporarily holding ventilation and during rapid ventricular pacing to maintain systolic blood pressure < 50 mmHg and pulse pressure < 10 mmHg. The balloon inflation is held for >3 seconds after reaching full deployment volume. Once the balloon has fully deflated the balloon is retracted into the ascending aorta and valve function is assessed using TTE. There is felt to be no paravalvular leak and no central aortic insufficiency.  The patient's hemodynamic recovery following valve deployment is good.  The deployment balloon and guidewire are both removed. Echo demostrated acceptable post-procedural gradients, stable mitral valve function, and no AI.   PROCEDURE COMPLETION:  The sheath was then removed and closure devices were completed. Protamine was administered once femoral arterial repair was complete. The temporary pacemaker was left in place as he was pacer dependent. The pigtail catheter and femoral arterial sheath were removed with a Mynx closure device placed in the artery.   The patient tolerated the procedure well and is transported to the surgical intensive care in stable condition. There were no immediate  intraoperative complications. All sponge instrument and needle counts are  verified correct at completion of the operation.   No blood products were administered during the operation.  The patient received a total of 30 mL of intravenous contrast during the procedure.  LVEDP: 21 mm Hg  Lonni Cash MD, FACC 07/26/2024 9:35 AM

## 2024-07-26 NOTE — Anesthesia Procedure Notes (Signed)
 Arterial Line Insertion Start/End10/14/2025 7:40 AM, 07/26/2024 7:45 AM Performed by: Zelphia Norleen HERO, CRNA, CRNA  Patient location: Pre-op. Preanesthetic checklist: patient identified, IV checked, site marked, risks and benefits discussed, surgical consent, monitors and equipment checked, pre-op evaluation, timeout performed and anesthesia consent Lidocaine 1% used for infiltration Left, radial was placed Catheter size: 20 G Hand hygiene performed  and maximum sterile barriers used   Attempts: 1 Following insertion, dressing applied and Biopatch. Post procedure assessment: normal and unchanged  Patient tolerated the procedure well with no immediate complications.

## 2024-07-26 NOTE — Discharge Summary (Cosign Needed Addendum)
 HEART AND VASCULAR CENTER   MULTIDISCIPLINARY HEART VALVE TEAM  Discharge Summary    Patient ID: [name] MRN: [id number]; DOB: 08-15-1941  Admit date: 07/26/2024 Discharge date: 07/29/2024  PCP:  Nichole Senior, MD  [facility] Cardiologist:  Alm Clay, MD  [facility] Structural heart: Lonni Cash, MD [facility] Electrophysiologist:  Will Gladis Norton, MD   Discharge Diagnoses    Principal Problem:   S/P TAVR (transcatheter aortic valve replacement) Active Problems:   Essential hypertension   Acquired hypothyroidism   Bipolar disorder (HCC)   Hyperlipidemia   BPH (benign prostatic hyperplasia)   Obesity   Stage 3a chronic kidney disease (HCC)   Sleep apnea   Acute on chronic systolic CHF (congestive heart failure) (HCC)   Biventricular heart failure, NYHA class 3 (HCC)   Symptomatic severe aortic stenosis with low ejection fraction   Coronary artery disease   Allergies No Known Allergies  Diagnostic Studies/Procedures    HEART AND VASCULAR CENTER  TAVR OPERATIVE NOTE     Date of Procedure:                07/26/2024   Preoperative Diagnosis:      Severe Aortic Stenosis    Postoperative Diagnosis:    Same    Procedure:        Transcatheter Aortic Valve Replacement - Transfemoral Approach             Edwards Sapien 3 Ultra Resilia THV (size 26 mm, model # [id number], serial # [id number] )              Co-Surgeons:                        Lonni Cash, MD and Linnie Rayas , MD    Anesthesiologist:                  Leopoldo   Echocardiographer:              Burton   Pre-operative Echo Findings: Severe aortic stenosis Moderate left ventricular systolic dysfunction   Post-operative Echo Findings: No paravalvular leak Moderate left ventricular systolic dysfunction  _____________    Echo 07/27/24: IMPRESSIONS   1. Akinesis of the distal septum, apex and distal inferior wall with  overall severe LV dysfunction.   2.  Left ventricular ejection fraction, by estimation, is 25 to 30%. The  left ventricle has severely decreased function. The left ventricle  demonstrates regional wall motion abnormalities (see scoring  diagram/findings for description). The left  ventricular internal cavity size was mildly dilated. Left ventricular  diastolic parameters are consistent with Grade II diastolic dysfunction  (pseudonormalization).   3. Right ventricular systolic function is normal. The right ventricular  size is mildly enlarged.   4. Left atrial size was moderately dilated.   5. Right atrial size was mildly dilated.   6. The mitral valve is normal in structure. No evidence of mitral valve  regurgitation. No evidence of mitral stenosis.   7. The aortic valve has been repaired/replaced. Aortic valve  regurgitation is not visualized. No aortic stenosis is present. There is a  26 mm Sapien prosthetic (TAVR) valve present in the aortic position.  Procedure Date: 07/26/24. Echo findings are  consistent with normal structure and function of the aortic valve  prosthesis.   8. Aortic dilatation noted. There is mild dilatation of the aortic root,  measuring 42 mm.   _____________   PPM Implantation 07/28/24 CONCLUSIONS:  1. Successful implantation of a Abbott Assurity [id number] dual-chamber pacemaker for symptomatic bradycardia  2. No early apparent complications.    History of Present Illness     [name] is a 83 y.o. male with a history of  HTN, HLD, DM, CKD stage IIIb, RBBB, 1st degree AV block, hypothyroidism, sleep apnea, bipolar disorder, severe biventricular heart failure and severe low flow/low gradient aortic stenosis who presented to [facility] on 07/26/24 for planned TAVR.   He reports having a murmur his entire life and told it was not a problem.  He has never been followed by cardiologist.  He was in his usual state of health until the past 2 to 3 months when he began experiencing progressive shortness of  breath and lower extremity/scrotal edema. Admitted to [facility] 9/30-10/9/25 for acute CHF requiring IV diuresis complicated by cardiorenal syndrome. Echo 07/13/24 showed 30-35% with global hypokinesis and disproportionately severe hypokinesis/akinesis in the mid-apical left anterior and anteroseptal walls, moderate RV dysfunction/enlargement, mild MR, and severe AS with mean gradient 33 mm hg, Vmax 3.93 m/s, AVA 0.77, DVI 0.19, SVI 41. There was interventricular septum flattened in systole, consistent with right ventricular pressure overload. [facility] 07/18/24 showed 100% pLCX stenosis with R-->L collaterals and 90% mLAD as well as mildly elevated filling pressures (PA mean 38 mm hg, PCWP 18 mm hg with V waves to 26), CO/CI 4.67L/min, 2.56 L/minm2. Pre TAVR CTs done during admission and found to be a VIV TAVR candidate, set up for 07/26/24.  Hospital Course     Consultants: none   Severe AS:  -- S/p successful TAVR with a 26 mm Edwards Sapien 3 Ultra Resilia THV via the TF approach on 07/26/24.  -- Post operative echo showed EF 25-30%, mild RVE, normally functioning TAVR with a mean gradient of 3 mmHg and no PVL. -- Groin sites are stable.  -- Continue Asprin 81mg  daily.  -- Met with cardiac rehab to discuss phase II. -- Plan discharge home with close follow up in the outpatient setting.    CHB: -- Underlying RBBB/1st deg AV block. -- Pacer dependant s/p TAVR and transferred to ICU.  -- S/p Abbott Assurity [id number] dual-chamber pacemaker on 07/28/24 by Dr. Inocencio.   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 -- LVEDP 21 mm Hg. -- Treated with one dose of IV Lasix 40mg  and KCL 20 meq post operatively. -- CXR today with pulm vasc congestion and diuretics held yesterday for PPM. Will treat with one dose of IV Lasix 40mg  and KCL 20 meq now. -- Resume home Lasix 40 mg daily and KCl 20 mEq daily at discharge. -- Resume Farxiga 10 mg daily. --  BMET at follow up.   CAD -- LHC w/ severe LAD disease and occluded LCx w/ R>>L collaterals -- Will need staged PCI after TAVR. Plan to do this after dental extractions, depending on when those will be scheduled.  -- Continue atorva 80 mg. -- Continue ASA 81 mg.   CKD IIIa -- Scr baseline ~ 1.3-1.6. -- Creat stable at 1.31 today    Poor dentition: -- Will need full mouth extractions after TAVR.  -- Referral sent to Dr. Celena. Orthopantogram done here sent to their office.   Cyst of pancreas: -- Pre TAVR CTs showed an 11 mm cystic lesion in the tail of the pancreas, increasing in size since previous study. Recommend follow up pre and post contrast MRI/MRCP or pancreatic protocol CT in 2 years. --  This will be dicussed in the outpatient setting and deferred to PCP.    _____________  Discharge Vitals Blood pressure (!) 144/79, pulse 63, temperature 98.2 F (36.8 C), temperature source Oral, resp. rate 20, height 5' 6 (1.676 m), weight 69.8 kg, SpO2 94%.  Filed Weights   07/27/24 0500 07/28/24 0557 07/29/24 0500  Weight: 71.4 kg 69.8 kg 69.8 kg     GEN: Well nourished, well developed in no acute distress NECK: No JVD CARDIAC: RRR, no murmurs, rubs, gallops RESPIRATORY:  Clear to auscultation without rales, wheezing or rhonchi  ABDOMEN: Soft, non-tender, non-distended EXTREMITIES:  No edema; No deformity.  Groin sites clear without hematoma or ecchymosis.    Disposition   Pt is being discharged home today in good condition.  Follow-up Plans & Appointments     Follow-up Information     [facility]. Go on 08/03/2024.   Specialty: Cardiology Why: @ 2pm, please arrive at least 20 minutes early. Contact information: [address] [place]  [id number] [phone]        Tranum, Jocelyn .   Specialty: General Practice        Dr Percilla at [facility]. Go on 08/09/2024.   Why: @ 12:15pm,  please arrive at least 20 minutes early. The consultation is free and we have emailed over the Xray of your mouth that we took in the hospital Contact information: [address] [place]  [place]                 Discharge Medications   Allergies as of 07/29/2024   No Known Allergies      Medication List     TAKE these medications    Aspirin Low Dose 81 MG chewable tablet Generic drug: aspirin Chew 1 tablet (81 mg total) by mouth daily.   atorvastatin 80 MG tablet Commonly known as: LIPITOR Take 1 tablet (80 mg total) by mouth daily.   carboxymethylcellulose 0.5 % Soln Commonly known as: REFRESH PLUS Place 1 drop into both eyes daily as needed (dry eyes).   docusate sodium  100 MG capsule Commonly known as: COLACE Take 200 mg by mouth 2 (two) times daily as needed for mild constipation.   Farxiga 10 MG Tabs tablet Generic drug: dapagliflozin propanediol Take 1 tablet (10 mg total) by mouth daily.   fluticasone 50 MCG/ACT nasal spray Commonly known as: FLONASE Place 2 sprays into both nostrils daily as needed for allergies.   furosemide 40 MG tablet Commonly known as: LASIX Take 1 tablet (40 mg total) by mouth daily.   levothyroxine 75 MCG tablet Commonly known as: SYNTHROID Take 75 mcg by mouth daily before breakfast.   polyethylene glycol 17 g packet Commonly known as: MIRALAX  / GLYCOLAX  Take 17 g by mouth 2 (two) times daily as needed for mild constipation.   potassium chloride SA 20 MEQ tablet Commonly known as: KLOR-CON M Take 1 tablet (20 mEq total) by mouth daily.          Outstanding Labs/Studies   BMET  ______________________  Duration of Discharge Encounter: APP Time: 35 minutes    Signed, Lamarr Hummer, PA-C 07/29/2024, 8:28 AM [phone]

## 2024-07-26 NOTE — Transfer of Care (Signed)
 Immediate Anesthesia Transfer of Care Note  Patient: [name]  Procedure(s) Performed: Transcatheter Aortic Valve Replacement, Transfemoral ECHOCARDIOGRAM, TRANSTHORACIC  Patient Location: Cath Lab  Anesthesia Type:MAC  Level of Consciousness: drowsy  Airway & Oxygen Therapy: Patient Spontanous Breathing and Patient connected to face mask oxygen  Post-op Assessment: Report given to RN and Post -op Vital signs reviewed and stable  Post vital signs: Reviewed and stable  Last Vitals:  Vitals Value Taken Time  BP 108/72 07/26/24 09:41  Temp    Pulse 69 07/26/24 09:43  Resp 12 07/26/24 09:43  SpO2 99 % 07/26/24 09:43  Vitals shown include unfiled device data.  Last Pain:  Vitals:   07/26/24 0646  TempSrc:   PainSc: 0-No pain         Complications: There were no known notable events for this encounter.

## 2024-07-26 NOTE — Progress Notes (Incomplete)
 HEART AND VASCULAR CENTER   MULTIDISCIPLINARY HEART VALVE TEAM  Patient Name: Mandela Bello Date of Encounter: 07/27/2024  Admit date: 07/26/2024   PCP:  Nichole Senior, MD  [facility] Cardiologist:  Alm Clay, MD  [facility] Structural heart: Lonni Cash, MD [facility] Electrophysiologist:  None   Hospital Problem List     Principal Problem:   S/P TAVR (transcatheter aortic valve replacement) Active Problems:   Essential hypertension   Acquired hypothyroidism   Bipolar disorder (HCC)   Hyperlipidemia   BPH (benign prostatic hyperplasia)   Obesity   Stage 3a chronic kidney disease (HCC)   Sleep apnea   Acute on chronic systolic CHF (congestive heart failure) (HCC)   Biventricular heart failure, NYHA class 3 (HCC)   Symptomatic severe aortic stenosis with low ejection fraction   Coronary artery disease     Subjective   No complaints. Worried about medical bills   Inpatient Medications    Scheduled Meds:  aspirin EC  81 mg Oral Daily   atorvastatin  80 mg Oral Daily   furosemide  40 mg Oral Daily   levothyroxine  75 mcg Oral QAC breakfast   potassium chloride SA  20 mEq Oral Daily   sodium chloride  flush  3 mL Intravenous Q12H   Continuous Infusions:  sodium chloride      nitroGLYCERIN Stopped (07/26/24 1230)   norepinephrine (LEVOPHED) Adult infusion Stopped (07/26/24 1522)   PRN Meds: sodium chloride , acetaminophen  **OR** acetaminophen , docusate sodium , morphine injection, ondansetron (ZOFRAN) IV, oxyCODONE, sodium chloride  flush, traMADol    Vital Signs    Vitals:   07/27/24 0545 07/27/24 0600 07/27/24 0700 07/27/24 0726  BP: 120/68  109/81   Pulse: 69 69 70   Resp: 20 13 18    Temp:    (!) 97.3 F (36.3 C)  TempSrc:    Axillary  SpO2: 91% 93% 96%   Weight:      Height:        Intake/Output Summary (Last 24 hours) at 07/27/2024 0932 Last data filed at 07/27/2024 0456 Gross per 24 hour  Intake 1323.43 ml  Output 2025 ml   Net -701.57 ml   Filed Weights   07/26/24 0625 07/27/24 0500  Weight: 69.5 kg 71.4 kg    Physical Exam    GEN: Well nourished, well developed, in no acute distress.  HEENT: Grossly normal.  Neck: Supple, no JVD or masses. RIJ in place.  Cardiac: RRR, no murmurs, rubs, or gallops. No clubbing, cyanosis, edema.   Respiratory:  Respirations regular and unlabored, clear to auscultation bilaterally. GI: Soft, nontender, nondistended, BS + x 4. MS: no deformity or atrophy. Skin: warm and dry, no rash.  Groin sites clear without hematoma or ecchymosis Neuro:  Strength and sensation are intact. Psych: AAOx3.  Normal affect.  Labs    CBC Recent Labs    07/25/24 0850 07/26/24 0948 07/27/24 0424  WBC 8.3  --  8.3  HGB 12.1* 9.9* 10.2*  HCT 38.3* 29.0* 31.6*  MCV 91.6  --  88.8  PLT 211  --  159   Basic Metabolic Panel: Recent Labs  Lab 07/21/24 0440 07/25/24 0850 07/26/24 0948 07/27/24 0424  NA 134* 139 140 134*  K 4.2 4.4 4.9 4.3  CL 93* 105 106 106  CO2 29 25  --  20*  GLUCOSE 130* 127* 110* 102*  BUN 48* 46* 45* 45*  CREATININE 1.53* 1.34* 1.40* 1.54*  CALCIUM 8.8* 9.2  --  8.3*  MG 2.1  --   --  1.8   GFR: Estimated Creatinine Clearance: 32.2 mL/min (A) (by C-G formula based on SCr of 1.54 mg/dL (H)). Recent Labs  Lab 07/25/24 0850 07/27/24 0424  WBC 8.3 8.3   Liver Function Tests Recent Labs    07/25/24 0850  AST 31  ALT 32  ALKPHOS 97  BILITOT 0.9  PROT 7.2  ALBUMIN 3.6    Telemetry    Vpaced - pacer dependant underneath- Personally Reviewed  ECG    Vpaced HR 71 bpm - Personally Reviewed  Patient Profile     Sylus Stgermain is a 83 y.o. male with a history of  HTN, HLD, DM, CKD stage IIIb, RBBB, 1st degree AV block, hypothyroidism, sleep apnea, bipolar disorder, severe biventricular heart failure and severe low flow/low gradient aortic stenosis who presented to [facility] on 07/26/24 for planned TAVR.   Assessment & Plan    Severe AS:  -- S/p  successful TAVR with a 26 mm Edwards Sapien 3 Ultra Resilia THV via the TF approach on 07/26/24.  -- Post operative echo pending.  -- Groin sites are stable.  -- Continue Asprin 81mg  daily.    CHB: -- Underlying RBBB/1st deg AV block. -- Pacer dependant s/p TAVR and transferred to ICU.  -- EP to see today with possible pacer this afternoon or tomorrow.    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 -- LVEDP 21 mm Hg. -- Treated with dose of IV Lasix 40mg  and KCL 20 meq yesterday. -- Hold Farxiga 10 mg daily until after PPM.  -- Resume home Lasix 40 mg daily and KCl 20 mEq daily.   CAD -- LHC w/ severe LAD disease and occluded LCx w/ R>>L collaterals -- Will need staged PCI after TAVR. Plan to do this after dental extractions.  -- Continue atorva 80 mg. -- Continue ASA 81 mg.   CKD IIIa -- Scr baseline ~ 1.3-1.6. -- Creat stable at 1.54 today    Poor dentition: -- Will need full mouth extractions after TAVR.   Bonney Lamarr Hummer, PA-C  07/27/2024, 9:32 AM  Pager [phone]   [name] was seen by me today along with Lamarr Hummer, PA-C. I have personally performed an evaluation on this patient.  My findings are as follows: 83 y.o. male with ischemic cardiomyopathy, severe mid LAD stenosis and severe AS. Now one day post TAVR. CHB post TAVR. Currently being paced with temp pacer in the right internal jugular vein.  No events overnight. He feels well this am.  BP stable.   Data: EKG(s) and pertinent labs, studies, etc were personally reviewed and interpreted by me:  V paced Tele: V paced Labs reviewed by me Otherwise, I agree with data as outlined by the advanced practice provider.  Exam performed by me: Gen: NAD Neck: No JVD Cardiac: RRR Lungs: clear bilaterally Extremities: No LE edema. Groins without hematoma.   My Assessment and Plan: Severe AS: s/p TAVR. Doing well.  CHB: EP to see for permanent  pacemaker Ischemic CM: Continue medical therapy CAD without angina: Medical mgmt of CAD for now. Likely stage PCI of LAD after dental extraction.   Signed,  Lonni Cash, MD  07/27/2024 12:10 PM

## 2024-07-26 NOTE — Anesthesia Preprocedure Evaluation (Signed)
 Anesthesia Evaluation  Patient identified by MRN, date of birth, ID band Patient awake    Reviewed: Allergy & Precautions, NPO status , Patient's Chart, lab work & pertinent test results  History of Anesthesia Complications Negative for: history of anesthetic complications  Airway Mallampati: III       Dental  (+) Chipped, Missing, Poor Dentition, Dental Advisory Given   Pulmonary shortness of breath, sleep apnea and Continuous Positive Airway Pressure Ventilation , neg COPD, neg recent URI, former smoker   breath sounds clear to auscultation       Cardiovascular hypertension, + CAD and +CHF  Past MI: .lasta.+ Valvular Problems/Murmurs AS  Rhythm:Regular + Systolic murmurs 1. No left ventricular thrombus is seen (Definity contrast was used).  Although there is global hypokinesis, there is disproportionately severe  hypokinesis/akinesis in the mid-apical left anterior and anteroseptal  walls. Left ventricular ejection  fraction, by estimation, is 30 to 35%. The left ventricle has moderately  decreased function. The left ventricle demonstrates regional wall motion  abnormalities (see scoring diagram/findings for description). Left  ventricular diastolic parameters are  consistent with Grade II diastolic dysfunction (pseudonormalization).  Elevated left atrial pressure. There is the interventricular septum is  flattened in systole, consistent with right ventricular pressure overload.   2. Right ventricular systolic function is moderately reduced. The right  ventricular size is mildly enlarged. Tricuspid regurgitation signal is  inadequate for assessing PA pressure.   3. Left atrial size was mildly dilated.   4. Right atrial size was mildly dilated.   5. The mitral valve is degenerative. Mild mitral valve regurgitation. No  evidence of mitral stenosis. The mean mitral valve gradient is 3.0 mmHg  with average heart rate of 66 bpm.  Moderate to severe mitral annular  calcification.   6. The aortic valve is tricuspid. There is severe calcifcation of the  aortic valve. There is severe thickening of the aortic valve. Aortic valve  regurgitation is trivial. Severe aortic valve stenosis.   7. The inferior vena cava is dilated in size with <50% respiratory  variability, suggesting right atrial pressure of 15 mmHg.     Neuro/Psych negative neurological ROS     GI/Hepatic Neg liver ROS,GERD  ,,  Endo/Other  diabetesHypothyroidism  Lab Results      Component                Value               Date                      HGBA1C                   6.2 (H)             07/16/2024             Renal/GU CRFRenal diseaseLab Results      Component                Value               Date                      NA                       139                 07/25/2024  K                        4.4                 07/25/2024                CO2                      25                  07/25/2024                GLUCOSE                  127 (H)             07/25/2024                BUN                      46 (H)              07/25/2024                CREATININE               1.34 (H)            07/25/2024                CALCIUM                  9.2                 07/25/2024                GFRNONAA                 52 (L)              07/25/2024                Musculoskeletal negative musculoskeletal ROS (+)    Abdominal   Peds  Hematology  (+) Blood dyscrasia, anemia Lab Results      Component                Value               Date                      WBC                      8.3                 07/25/2024                HGB                      12.1 (L)            07/25/2024                HCT                      38.3 (L)            07/25/2024                MCV  91.6                07/25/2024                PLT                      211                 07/25/2024              Anesthesia  Other Findings   Reproductive/Obstetrics                              Anesthesia Physical Anesthesia Plan  ASA: 4  Anesthesia Plan: MAC   Post-op Pain Management: Minimal or no pain anticipated   Induction: Intravenous  PONV Risk Score and Plan: 1 and Ondansetron  Airway Management Planned: Nasal Cannula, Natural Airway and Simple Face Mask  Additional Equipment: Arterial line  Intra-op Plan:   Post-operative Plan:   Informed Consent: I have reviewed the patients History and Physical, chart, labs and discussed the procedure including the risks, benefits and alternatives for the proposed anesthesia with the patient or authorized representative who has indicated his/her understanding and acceptance.     Dental advisory given  Plan Discussed with: CRNA  Anesthesia Plan Comments:          Anesthesia Quick Evaluation

## 2024-07-26 NOTE — Discharge Instructions (Addendum)
 ACTIVITY AND EXERCISE  Daily activity and exercise are an important part of your recovery. People recover at different rates depending on their general health and type of valve procedure.  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             - For six weeks for procedures through the chest wall or neck. NOTE: You will typically see one of our providers 7-14 days after your procedure to discuss WHEN TO RESUME the above activities.      DRIVING  Do not drive until you are seen for follow up and cleared by a provider. Generally, we ask patient to not drive for 1 week after their procedure.  If you have been told by your doctor in the past that you may not drive, you must talk with him/her before you begin driving again.   DRESSING  Groin site: you may leave the clear dressing over the site until follow up or remove 3-5 days post procedure.   HYGIENE  If you had a femoral (leg) procedure, you may take a shower when you return home. After the shower, pat the site dry. Do NOT use powder, oils or lotions in your groin area until the site has completely healed.   If you had a chest procedure, you may shower when you return home unless specifically instructed not to by your discharging practitioner.             - DO NOT scrub incision; pat dry with a towel.             - DO NOT apply any lotions, oils, powders to the incision.             - No tub baths / swimming for at least 2 weeks.  If you notice any fevers, chills, increased pain, swelling, bleeding or pus, please contact your provider.   ADDITIONAL INFORMATION  If you are going to have an upcoming dental procedure, please contact our office as you will require antibiotics ahead of time to prevent infection on your heart valve.    If you have any  questions or concerns you can call the structural heart phone during normal business hours 8am-4pm. If you have an urgent need after hours or weekends please call [phone] to talk to the on call provider for general cardiology. If you have an emergency that requires immediate attention, please call 911.    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 Your incision sites will be checked and you will be cleared to drive and resume all normal activities if you are doing well.     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   Follow up with your primary cardiologist You will need to be seen by your primary cardiologist in the following 3-6 months after your 1 month appointment in the valve clinic.    1 year echo and follow up (60 days +/- the anniversary of your TAVR) You will have another echo  to check on your heart valve after 1 year and be seen back in the office by a structural heart advanced practice provider. This your last structural heart visit.   Bacterial endocarditis prophylaxis  You will have to take antibiotics for the rest of your life before all dental procedures (even teeth cleanings) to protect your heart valve. Antibiotics are also required before some surgeries. Please check with your cardiologist before scheduling any surgeries. Also, please make sure to tell us  if you have a penicillin allergy as you will require an alternative antibiotic.    [facility] Address: [address] [place], [place] [address] Phone Number: [phone] Hours of Operation: Residents of [place] can come to obtain food Monday through Friday from 8:30am until 3:30pm. Photo ID and Social Security cards required for all residents of a  household. Can come six times a year  [facility] Address: [address] [place], [place] [address] Phone Number: [phone] Hours of Operation: Operates Tuesday-Friday 10:00 a.m. to 1 p.m. Requirements: Referral from DSS needed. May come 6 times a year, 30 days apart. Photo ID and SS required for all residents of household.  [facility] Address: [address] [place], [place] [address] Phone Number: [phone] Hours of Operation: Food pantry is open on the last Saturday of each month from 10:00 am - 12:00 noon. No appointment needed. No qualifications.  [facility] Address: [address] [place], [place] [address] Phone Number: [phone] EXT. 21 Hours of Operation: Must make reservations to pick up food on Saturdays. Sign ups for Saturday pick up beginning at 8:30 a.m. on Monday morning.  [facility] Address: [address] [place], [place] [address] Phone Number: [phone] Hours of Operation: If you need food, bring proper identification such as a driver's license to receive a bag of food once a month. Requirements: Can come once every 30 days with referral DSS, Holiday representative, Mental health etc. Each referral good for six visits. Photo ID required. *1st visit no referral required.  [facility] Address: [address] [place], [place] [address] Phone Number: [phone]  [facility] Address: [address] [place], [place] [address] Phone Number: [phone] Hours of Operation:  You can register at [email or web address] for free groceries  [facility] Address: [address] [place], [place] [address] Phone Number: [phone] Hours of Operation: Drive through giveaway, first come first served. Every 3rd Saturday 11AM - 1PM  [facility] [address] Address: [address] [place] [address] Phone Number: [phone]   [place]  [facility] Address: [address] [place], [place] [address] Phone Number: [phone] Hours of Operation: Once a month every 3rd Saturday  [facility] Address: [address] [place], [place] [address] Phone Number: [phone] Hours of Operation: Distribution happens from 9:00-10:00 a.m. every Saturday.     [facility] Address: [address] [place], [place] [address] Phone Number: [phone] Hours of Operation: ONCE a week for the community food distribution held every Tuesday, Wednesday and Thursday from 11 a.m. - 2:00 p.m. Food is available on a first come, first serve basis and varies week to week. No appointment necessary for drive thru pick up.  [facility] Address: [address] [place], [place] [address] Phone Number: [phone] Hours of Operation: Open every 3rd Thursday 9:30 a.m. - 11:00 a.m.  [facility] Address: [address]  [place], [place] [address] Phone Number: [phone] Hours of Operation: Please call for hours, directions, and questions  [facility] [place] [facility] Address: [address] [place], [place]  [address] Phone Number: [phone] Website: https://www.Hollyguns.co.za Food Finder app: https://findfood.ghpfa.org  [employer] Address: [address] [place], [place] [address] Phone Number: [phone] Hours of Operation: Contact Bree Harpe. Enrolled Substance Abuse Clients Only  [facility] Address: [address] [place], [address]  Phone Number: [phone] Hours of Operation: Contact Bartley Irving. Food pantry open the 3rd Saturday of each month from 9 a.m. -12 p.m. only  [place] [facility] Address: [address], [place] [address] Phone Number: [phone] Hours of Operation: Contact Geni Lee. Emergency food bank open on Saturdays by appointment only  [facility] Address: [address] [place], [place] [address] Phone Number: [phone] Hours of Operation: No specific contact person; Anyone can help  [facility] Address: [address] [place], [place] [address] Phone Number: [phone] Hours of Operation: Contact Medford Molt. Agency gives out a bag of food every Thursday from 2-4 p.m. only, and also provides a community meal every Thursday between 5-6 p.m. Other services provided include rent/mortgage and utility assistance, women's winter shelter, thrift store, and senior adult activities.  [facility] [place] Address: [address] [place], [place] [address] Phone Number: [phone] Hours of Operation: The Emergency Food Assistance Program provides individuals and families with a generous supply of food including meat, fresh vegetables, and nonperishable items. The food box contains five days' worth of food, and each family or individual can receive a box once per month. M, W, Th, Fr 11am-2pm, walk-ins welcome.  [facility] Address: [address] [place], [place] [address]  Phone Number: [phone] Hours of Operation: Contact Asia Cathlean. Tuesdays and Thursdays from 11am - 3pm by appointment only Sunday, by APPOINTMENT ONLY  [address] of [place], [address], [address], [phone], 3.2 mi from [facility], call in advance for appointment at 10:00am or at 4:00pm, must provide valid photo ID  Monday  9:30am-5:00pm [facility], [address] [place] [phone], [phone], 0.9 mi from [facility], can come four times per year, bring your photo ID and SS cards for other residents of household, will make appointments for those who work and need to come after 5pm  10:00am-12:00noon [employer], [address] [place]  [place], [address], [phone], 1.7 mi from [facility], can come once every 60 days per household, need referral from DSS, [employer], etc., bring photo ID and SS card   10:00am-1:00pm [facility] [facility],  [address] [place], [address], [phone], 7.7 mi from [facility], can come once every thirty days with a referral from DSS, [employer], Mental Health, etc. -- each referral good for six visits, bring photo ID   10:00am-1:00pm [address] [address], [phone], 4.2 mi from [facility], can come once every 6 months, open to [facility] residents, bring photo ID and copy of a current utility bill in your name, please call first to verify that food is available  6:30pm-8:30pm [facility], [address] [address], [phone], 3.2 mi from [facility], can come once every 30 days, maximum 6 times per year,  bring your photo ID and SS numbers for other residents of household  Monday by APPOINTMENT ONLY  [facility], [address],  [phone], 2.5 mi from [facility], call in advance for appointment between 10:00am-2:00pm, bring your photo ID and SS cards for all residents of household, can come once every 3 months  [facility], [address], [address], [phone], 0.7 mi from [facility], call in advance for appointment, can come once every 30 days, bring your photo ID and SS cards for other residents of household   Tuesday  9:00am-12:00noon [employer], [address] [address], [phone], 1.3 mi from [facility], can come once every 3 months, bring your photo ID and SS numbers for other residents of household   9:00am-1:00pm [facility] [address]  [place], [address], [phone], 1.6 mi from [facility], can come once every two weeks  9:30am-5:00pm [employer], [address] [place] [phone], [phone], 0.9 mi from [facility], can come four times per year, bring your photo ID and SS cards for other residents of  household, will make appointments for those who work and need to come after 5pm  10:00am-12:00noon [employer], [address]  [place], [address], [phone], 1.7 mi from [facility], can come once every 60 days per household, need referral from DSS, [employer], etc., bring photo ID and SS card  10:00am-1:00pm [facility], [id number],  [phone], 3.8 mi from [facility], with referral from DSS, may come six times, 30 days apart, bring your photo ID and SS cards for all residents of household   10:00am-1:00pm [address] the [facility], [address] [place], [address], [phone], 7.7 mi from [facility], can come once every thirty days with a referral from DSS, [employer], Mental Health, etc.- each referral good for six visits, bring photo ID   10:00am-1:00pm [address] [address], [phone], 4.2 mi from [facility], can come once every 6 months, open to [facility] residents, bring photo ID and copy of a current utility bill in your name, please call first to verify that food is available  2:00pm-3:30pm [facility], [address] [phone], [phone], 3.7 mi from [facility], can come twelve times per year, one bag per family, bring photo ID   FIRST AND THIRD Tuesdays  10:00am-1:00pm, [address] [place], [address], [phone], 7.2 mi from [facility], can come once every 30 days   Tuesday, WHEN FOOD IS AVAILABLE (call)  12:00noon-2:00pm [facility], [address], [address], [phone], 1.5 mi from  [facility], can come once every 30 days, bring photo ID   Tuesday, by APPOINTMENT ONLY  [facility], [address], [address],  [phone], 2.5 mi from [facility], call in advance for appointment between 1:00pm-4:00pm,  bring your photo ID and SS cards for all residents of household, can come once every 3 months   [address] of [place], [address], [address], [phone], 5 mi from [facility], call one day ahead for appointment the next day between 10:00am and 12:00noon, can come once every 3 months, bring your photo ID  and must qualify according to family income   [facility], [address] [address], [phone], 0.7 mi from [facility], call in advance for appointment, can come once every 30 days, bring your photo ID and SS cards for other residents of household   [address] of [place], [address], [address],  [phone], 5.1 mi from [facility], call between 9:00am and 1:00pm M-F to make appointment. Appointments are scheduled for Tues and Thurs from 10:00am-11:30am, bring photo ID, can come once every 6 months, limit three visits over 18 months, then must have referral  Wednesday  9:30am-5:00pm [facility], [address] [place] [phone], [phone], 0.9 mi from [facility], can come four times per year, bring your photo ID and SS cards for other residents of household, will make appointments for those who work and need to come after 5pm  9:30am-11:30am [employer] [facility], [address], [address], [phone], 6.6 mi from [facility], can come once every 30 days, bring your photo ID, and SS cards for other residents of household, each monthly visit requires a written referral from GUM or DSS with number in household on form  10:00am-12:00noon [address], [address]  [place], [address], [phone], 1.7 mi from [facility], can come once every 60 days per household, need referral from DSS, [employer], etc., bring photo ID and SS card  10:00am-1:00pm [facility], [id number],  [phone], 3.9 mi from [facility], with referral from DSS, may come six  times, 30 days apart, bring your photo ID and SS cards for all residents of household   10:00am-1:00pm [address] the [facility], [address] [place], [address], [phone], 7.7 mi from [facility], can come once every thirty days with a referral from DSS, [employer], Mental Health, etc. - each referral good for six visits, bring photo ID   2:00pm-5:45pm [address], [address] [place], [address], [phone], 3.2 mi from [facility], once every 30 days, first come/first served, limited to first 25, bring photo ID   6:30pm-8:30pm [facility], [address] [address], [phone], 3.2 mi from [facility], can come once every 30 days, maximum 6 times per year, bring your photo ID and SS numbers for other residents of household  FIRST and THIRD Wednesdays   9:00am-12:00noon, [facility], [address], [address], [phone], 4.2 mi from [facility], can come once a month. Please arrive and sign in no later than 11:15 so everyone can be served by 12 noon.  THIRD Wednesday  1:30pm-3:00pm, [address] [place], [address], [phone] or [phone], 2.1 mi from [facility]  Wednesday, by APPOINTMENT ONLY  [address] [facility] [place], [address], [address], [phone], 5 mi from [facility], call one day ahead for appointment the next day between 10:00am and 12:00noon, can come once every 3 months, bring your photo ID and must qualify according to family income   [facility], [address], [address], [phone], 0.7 mi from [facility], call in advance for appointment, can come once every 30 days, bring your photo ID and SS cards for other residents of household   [address] of [place], [address] [place], [address], [phone], 3.2  mi from [facility], call in advance for appointment at 7:00pm, must provide valid photo ID  Thursday   9:00am-12:00noon [employer], [address] [address], [phone], 1.3 mi from [facility], can come once every 3 months, bring your photo ID and SS numbers for other residents of household   9:00am-1:00pm [facility] [address]  [place], [address], [phone]/Ext [phone], 1.6 mi from [facility], can come once every two weeks  9:30am-12:00noon [facility], [address], [address], [phone], 4.5 mi from [facility], can come once every 30 days, must state income [closed Thanksgiving and week of Christmas]  9:30am-5:00pm [employer], [address] [place] [phone], [phone], 0.9 mi from [facility], can come four times per year, bring your photo ID and SS card for other residents of household, will make appointments for those who work and need to come after 5pm  10:00am-1:00pm [employer], [address] [place], [id number],  [phone], 3.9 mi from [facility], with referral from DSS, may come six times, 30 days apart, bring your photo ID and SS cards for all residents of household   10:00am-1:00pm [address] the [facility], [address] [place], [address], [phone], 7.7 mi from [facility], can come once every thirty days with a referral from DSS, [employer], Mental Health, etc.- each referral good for six visits, bring photo ID   10:00am-1:00pm [facility], [address] [address], [phone], 4.2 mi from [facility], can come once every 6 months, open to [facility] residents, bring photo ID and copy of a current utility bill in your name, please call first to verify that food is available  SUNDAYS BREAKFAST TWO LOCATIONS: 8:00am served in [facility] by [employer] 8:30am SHUTTLE provided from [facility], served at [employer], [address] LUNCH TWO LOCATIONS [plus one additional third Sunday only]  10:30am - 12:30pm served at [employer], [employer], [place] [address] (1.2 miles from [facility]) 12:30pm served in [place] by [employer] Team (THIRD Sunday only) 1:30pm served at [facility] by [facility] one location [plus one additional third Sunday only] 5:00pm Every Sunday, served under the bridge at [address] by Dovie Under the [employer] (.7 miles from [facility]) (THIRD Sunday ONLY) 4:00pm served in the parking garage, across from [employer], corner of [place] and [place] by [employer] MONDAYS BREAKFAST 7:30am served in [employer] by the [employer] and Friends LUNCH 10:30am - 12:30pm served at [employer], [employer], [place] [address] (1.2 miles from [facility]) DINNER TWO LOCATIONS: 7:00pm served in front of the courthouse at the corner of [employer] and [facility]. by [place] Monday Night Meal (3 blocks from [facility]) 4:30pm served at the [employer], [address] by [employer] [place] [employer] (0.6 miles from [facility]) TUESDAYS BREAKFAST 8:00am - 9:00am served at [employer], [address] (0.3 miles from [place]) LUNCH 10:30am - 12:30pm served at the [employer], [employer] [address] (1.2 miles from [place]) DINNER 6:00pm served at [employer], enter from [employer] and go to the [employer], (0.7 miles  from [facility]) [facility] BREAKFAST 7:00am - 8:00am served at [employer], [employer] [address], (1.2 miles from [place]) LUNCH ONE LOCATION [plus two additional locations listed below] 10:30am - 12:30pm served at [employer], [employer] [address], (1.2 miles from [place]) (FIRST Wednesday ONLY) 11:30am served at [employer], [place] [address] (6.6 miles from [place]) (SECOND Wednesday ONLY) 11:00am served at [place]. Garnette Ava [place] of [address], [address] (1.3 miles from [place]) [place] TWO LOCATIONS 6:00pm served at [employer], [place] [address] [employer]. (1.3 miles from [facility]) 4:00pm - 6:00pm (hot dogs and chips) served at [employer] of [facility], [address] (1.7 miles from [place]) [place] BREAKFAST NOT AVAILABLE AT THIS TIME LUNCH 10:30am - 12:30pm served at [employer], [employer], [place] [address] (1.2 miles from [place]) DINNER 6:00pm served at [employer], enter from [employer] and go to the [employer], (0.7 miles from [employer]) [place] BREAKFAST NOT AVAILABLE AT THIS TIME LUNCH 10:30am - 12:30pm served at [employer], [employer] [address], (1.2 miles from [place]) DINNER TWO LOCATIONS, [plus one additional first Friday only] 6:00pm served under the bridge at [address] by Dovie Under [employer]. (.7 miles from [facility]) 5:00pm - 7:00pm served at [employer] of [facility], [address] (1.7 miles from [place]) (FIRST Friday ONLY) 5:45 pm - SHUTTLE provided from the LIBRARY at 5:45pm. Served at [facility], [address] [place]. SATURDAYS BREAKFAST TWO LOCATIONS [plus one additional last Saturday only] 8:00am served at [facility] by [employer] 8:30am served at [employer], [address] (2.2 miles from [facility]) (LAST Saturday ONLY) 8:30am served at [employer], [address] (5 miles from [place]) LUNCH 10:30am - 12:30pm served at [employer], [employer] [address] [place] (1.2 miles from [facility]) DINNER 6:00pm served under the  bridge at [address] by [employer] (0.7 miles from [employer])  DIRECTIONS FROM [place] TO ALL MEAL LOCATIONS The Bridge at [address] (.7 miles from [address]) [address] on [place]. Turn Right onto [employer]V 433 ft. Continue onto [address] under bridge, about 500 ft. Courthouse (3 blocks from [facility]) [place] on [address]. Turn right on [place]  1 block to [employer] (.5 miles from [place]) [place] on [place]. [employer]. past [employer] to [employer]. Enter from [employer] and go to the [employer] building [employer] [address]. (1.3 miles from [facility]) [address] on [place]. Turn Right onto [address]. Jama [place]is. church will be on the Left. The [employer] [address] (.3 miles from [facility]) Go .3 miles on [address] Destination is on your right [employer] at [employer] (6.6 miles from [address]) [address] on [place] toward [address] Turn right onto [address] Continue onto Al[employer]a Inc. Continue onto T[employer]. 5. [place] is on right [employer] Futures trader) [address] (.6 miles from  Center [address]) [place] on [place]. [address] Turn Left onto [address] 0.3 miles Destination is on the Left. [place] [employer] at [employer] (5 miles from [employer]) 1. Head south on [address]. Turn right onto [address] Turn slightly left onto [employer] Continue onto [employer] Turn right at [employer] Continue to church on right [place] Sounds of [facility] [address] (1.7 miles from [place]) [address] on [place] 1.4 miles [place] becomes [place]. [place] [address]. Continue 0.6 miles and church will be on theright. [place] [employer] at [address] (2.5 miles from [employer]) [place] provided from  [employer] [employer] [place]] [place] on [place]. [place] toward [employer] right onto [employer] left onto [employer] Turn left onto [employer] [address] (2.2 miles from [address]) [address] on [place] 1.4 miles [place] becomes [place]. [place] [address] Turn right onto [address] [place]  [address] and church will be on the Left. [place] [employer] [address] (1.2 miles from [facility]) 1.Turn right onto [facility] 2.Turn left onto GEANNIE Beagle 3.Micheline MICAEL Ruth 4.Destination is on your right [place]. [place] Ava [place] of [address] at [employer] (1.3 miles from [facility]) [address] on [address] Turn left onto Genuine Parts right onto S. Maryellen Cassis. Continue onto KB Home	[place]. Turn left onto [employer]. Turn right onto [employer].       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   If you have an Abbott or AutoZone device, plug your home monitor once you get home, sit near the device, and press the large activation button. Sit nearby until the process is complete, usually notated by lights on the monitor.   If you were set up for monitoring using an app on your phone, make sure the app remains open in the background and the Bluetooth remains on.  ACTIVITY Do not lift your arm above shoulder height for 1 week after your procedure. After 7 days, you may progress as below.  You should remove your sling 24 hours after your procedure, unless otherwise instructed by your provider.     Friday August 05, 2024  Saturday August 06, 2024 Sunday August 07, 2024 Monday August 08, 2024   Do not lift, push, pull, or carry anything over 10 pounds with the affected arm until 6 weeks (Friday September 09, 2024 ) after your procedure.   You may drive AFTER your wound check, unless you have been  told otherwise by your provider.   Ask your healthcare provider when you can go back to work   INCISION/Dressing If you are on a blood thinner such as Coumadin, Xarelto, Eliquis, Plavix, or Pradaxa please confirm with your provider when this should be resumed.   Do not remove steri-strips or glue as below.   If a PRESSURE DRESSING (a bulky dressing that usually goes up over your shoulder) was applied or left in place, please follow instructions given by your provider on when to return to have this removed.   Monitor your Pacemaker site for redness, swelling, and drainage. Call the device clinic at [phone] if you experience these symptoms or fever/chills.  If your incision is sealed with Steri-strips or staples, you may shower 7 days after your procedure or when told by your provider. Do not remove the steri-strips or let the shower hit directly on your site. You may wash around your site with soap and water.    If you were discharged in a sling, please do not wear this during the day more than 48 hours after your surgery unless otherwise instructed. This may increase the risk of stiffness and soreness in your shoulder.   Avoid lotions, ointments, or perfumes over your incision until it is well-healed.  You may use a hot tub or a pool AFTER your wound check appointment if the incision is completely closed.  Pacemaker Alerts:  Some alerts are vibratory and others beep. These are NOT emergencies. Please call our office to let us  know. If this occurs at night or on weekends, it can wait until the next business day. Send a remote transmission.  If your device is capable of reading fluid status (for heart failure), you will be offered monthly monitoring to review this with you.   DEVICE MANAGEMENT Remote monitoring is used to monitor your pacemaker from home. This monitoring is scheduled every 91 days by our office. It allows us  to keep an eye on the functioning of your device to ensure it  is working properly. You will routinely see your Electrophysiologist annually (more often if necessary).  This will appear as a REMOTE check on your MyChart schedule. These are automatic and there is nothing for you to manually do unless otherwise instructed.  You should receive your ID card for your new device in 4-8 weeks. Keep this card with you at all times once received. Consider wearing a medical alert bracelet or necklace.  Your Pacemaker may be MRI compatible. This will be discussed at your next office visit/wound check.  You should avoid contact with strong electric or magnetic fields.   Do not use amateur (ham) radio equipment or electric (arc) welding torches. MP3 player headphones with magnets should not be used. Some devices are safe to use if held at least 12 inches (30 cm) from your Pacemaker. These include power tools, lawn mowers, and speakers. If you are unsure if something is safe to use, ask your health care provider.  When using your cell phone, hold it to the ear that is on the opposite side from the Pacemaker. Do not leave your cell phone in a pocket over the Pacemaker.  You may safely use electric blankets, heating pads, computers, and microwave ovens.  Call the office right away if: You have chest pain. You feel more short of breath than you have felt before. You feel more light-headed than you have felt before. Your incision starts to open up.  This information is not intended to replace advice given to you by your health care provider. Make sure you discuss any questions you have with your health care provider.

## 2024-07-26 NOTE — Progress Notes (Addendum)
  HEART AND VASCULAR CENTER   MULTIDISCIPLINARY HEART VALVE TEAM  Patient doing well s/p TAVR. He is hemodynamically stable. Groin sites stable. ECG with V paced rhythm. Tele shows paced rhythm at 70. I turned pacer down and he has CHB underneath with pacing at 30bpm. EP team consulted and will likely need PPM. LVEDP ~21 mm hg at the time of TAVR. Will treat with one dose of IV Lasix 40mg  and KCL 20meq.   Lamarr Hummer PA-C  [facility]  Pager [phone]

## 2024-07-26 NOTE — Op Note (Signed)
 HEART AND VASCULAR CENTER  TAVR OPERATIVE NOTE     Date of Procedure:                07/26/2024   Preoperative Diagnosis:      Severe Aortic Stenosis    Postoperative Diagnosis:    Same    Procedure:        Transcatheter Aortic Valve Replacement - Transfemoral Approach             Edwards Sapien 3 Ultra Resilia THV (size 26 mm, model # [id number], serial # [id number] )              Co-Surgeons:                        Lonni Cash, MD and Linnie Rayas , MD    Anesthesiologist:                  Leopoldo   Echocardiographer:              Burton   Pre-operative Echo Findings: Severe aortic stenosis Moderate left ventricular systolic dysfunction   Post-operative Echo Findings: No paravalvular leak Moderate left ventricular systolic dysfunction   BRIEF CLINICAL NOTE AND INDICATIONS FOR SURGERY   83 y.o. male with history of HTN, HLD, DM, CKD, RBBB, 1st degree AV block, hypothyroidism, sleep apnea, bipolar disorder admitted with massive volume overload. He was found to have severe biventricular heart failure and severe low flow/low gradient aortic stenosis. He was diuresed with inotropic support by the Advanced Heart Failure team. Echo with LVEF=30-35% with global HK with severe hypokinesis of the mid-apical anterior and anteroseptal walls. Severe LFLG AS with mean gradient 33 mmHg, AVA 0.77 cm2, DI 0.19, SVI 41. Evidence of RV overload. Cardiac cath with 100% Circumflex occlusion with filling from right to left collaterals and severe mid LAD stenosis.    During the course of the patient's preoperative work up they have been evaluated comprehensively by a multidisciplinary team of specialists coordinated through the Multidisciplinary Heart Valve Clinic in the [facility].  They have been demonstrated to suffer from symptomatic severe aortic stenosis as noted above. The patient has been counseled extensively as to the relative risks and benefits of all options  for the treatment of severe aortic stenosis including long term medical therapy, conventional surgery for aortic valve replacement, and transcatheter aortic valve replacement.  The patient has been independently evaluated by Dr. Rayas with CT surgery and they are felt to be at high risk for conventional surgical aortic valve replacement. The surgeon indicated the patient would be a poor candidate for conventional surgery. Based upon review of all of the patient's preoperative diagnostic tests they are felt to be candidate for transcatheter aortic valve replacement using the transfemoral approach as an alternative to high risk conventional surgery.     Following the decision to proceed with transcatheter aortic valve replacement, a discussion has been held regarding what types of management strategies would be attempted intraoperatively in the event of life-threatening complications, including whether or not the patient would be considered a candidate for the use of cardiopulmonary bypass and/or conversion to open sternotomy for attempted surgical intervention.  The patient has been advised of a variety of complications that might develop peculiar to this approach including but not limited to risks of death, stroke, paravalvular leak, aortic dissection or other major vascular complications, aortic annulus rupture, device embolization, cardiac rupture or perforation, acute  myocardial infarction, arrhythmia, heart block or bradycardia requiring permanent pacemaker placement, congestive heart failure, respiratory failure, renal failure, pneumonia, infection, other late complications related to structural valve deterioration or migration, or other complications that might ultimately cause a temporary or permanent loss of functional independence or other long term morbidity.  The patient provides full informed consent for the procedure as described and all questions were answered preoperatively.       DETAILS OF  THE OPERATIVE PROCEDURE   PREPARATION:   The patient is brought to the operating room on the above mentioned date and central monitoring was established by the anesthesia team including placement of a radial arterial line. The patient is placed in the supine position on the operating table.  Intravenous antibiotics are administered. Conscious sedation is used.    Baseline transthoracic echocardiogram was performed. The patient's chest, abdomen, both groins, and both lower extremities are prepared and draped in a sterile manner. A time out procedure is performed.     PERIPHERAL ACCESS:   Using the modified Seldinger technique, femoral arterial and venous access were obtained with placement of a 6 Fr sheath in the right femoral artery and a 6Fr sheath in the internal jugular vein on the right side using u/s guidance.  A pigtail diagnostic catheter was passed through the femoral arterial sheath under fluoroscopic guidance into the aortic root.  A temporary transvenous pacemaker catheter was passed through the venous sheath under fluoroscopic guidance into the right ventricle.  The pacemaker was tested to ensure stable lead placement and pacemaker capture. Aortic root angiography was performed in order to determine the optimal angiographic angle for valve deployment.   TRANSFEMORAL ACCESS:  A micropuncture kit was used to gain access to the left femoral artery using u/s guidance. Position confirmed with angiography. Pre-closure with double ProGlide closure devices. The patient was heparinized systemically and ACT verified > 250 seconds.     A 14 Fr transfemoral E-sheath was introduced into the left femoral artery after progressively dilating over an Amplatz superstiff wire. An AL-2 catheter was used to direct a straight-tip exchange length wire across the native aortic valve into the left ventricle. This was exchanged out for a pigtail catheter and position was confirmed in the LV apex. Simultaneous LV and  Ao pressures were recorded.  The pigtail catheter was then exchanged for a Safari wire in the LV apex.    TRANSCATHETER HEART VALVE DEPLOYMENT:  An Edwards Sapien 3 THV (size 26 mm) was prepared and crimped per manufacturer's guidelines, and the proper orientation of the valve is confirmed on the [employer] delivery system. The valve was advanced through the introducer sheath using normal technique until in an appropriate position in the abdominal aorta beyond the sheath tip. The balloon was then retracted and using the fine-tuning wheel was centered on the valve. The valve was then advanced across the aortic arch using appropriate flexion of the catheter. The valve was carefully positioned across the aortic valve annulus. The Commander catheter was retracted using normal technique. Once final position of the valve has been confirmed by angiographic assessment, the valve is deployed while temporarily holding ventilation and during rapid ventricular pacing to maintain systolic blood pressure < 50 mmHg and pulse pressure < 10 mmHg. The balloon inflation is held for >3 seconds after reaching full deployment volume. Once the balloon has fully deflated the balloon is retracted into the ascending aorta and valve function is assessed using TTE. There is felt to be no paravalvular leak and  no central aortic insufficiency.  The patient's hemodynamic recovery following valve deployment is good.  The deployment balloon and guidewire are both removed. Echo demostrated acceptable post-procedural gradients, stable mitral valve function, and no AI.    PROCEDURE COMPLETION:  The sheath was then removed and closure devices were completed. Protamine was administered once femoral arterial repair was complete. The temporary pacemaker was left in place as he was pacer dependent. The pigtail catheter and femoral arterial sheath were removed with a Mynx closure device placed in the artery.    The patient tolerated the  procedure well and is transported to the surgical intensive care in stable condition. There were no immediate intraoperative complications. All sponge instrument and needle counts are verified correct at completion of the operation.    No blood products were administered during the operation.   The patient received a total of 30 mL of intravenous contrast during the procedure.   LVEDP: 21 mm Hg  Linnie MALVA Rayas, MD

## 2024-07-27 ENCOUNTER — Telehealth: Payer: Self-pay

## 2024-07-27 ENCOUNTER — Encounter (HOSPITAL_COMMUNITY): Payer: Self-pay | Admitting: Cardiovascular Disease

## 2024-07-27 ENCOUNTER — Inpatient Hospital Stay (HOSPITAL_COMMUNITY)

## 2024-07-27 DIAGNOSIS — K862 Cyst of pancreas: Secondary | ICD-10-CM | POA: Diagnosis not present

## 2024-07-27 DIAGNOSIS — I442 Atrioventricular block, complete: Secondary | ICD-10-CM

## 2024-07-27 DIAGNOSIS — I350 Nonrheumatic aortic (valve) stenosis: Secondary | ICD-10-CM | POA: Diagnosis not present

## 2024-07-27 DIAGNOSIS — I5023 Acute on chronic systolic (congestive) heart failure: Secondary | ICD-10-CM | POA: Diagnosis not present

## 2024-07-27 DIAGNOSIS — Z952 Presence of prosthetic heart valve: Secondary | ICD-10-CM

## 2024-07-27 DIAGNOSIS — I2510 Atherosclerotic heart disease of native coronary artery without angina pectoris: Secondary | ICD-10-CM

## 2024-07-27 LAB — ECHOCARDIOGRAM COMPLETE
AR max vel: 2.39 cm2
AV Area VTI: 2.82 cm2
AV Area mean vel: 2.51 cm2
AV Mean grad: 3 mmHg
AV Peak grad: 6.1 mmHg
Ao pk vel: 1.23 m/s
Area-P 1/2: 3.61 cm2
Height: 66 in
S' Lateral: 4.6 cm
Weight: 2518.54 [oz_av]

## 2024-07-27 LAB — BASIC METABOLIC PANEL WITH GFR
Anion gap: 8 (ref 5–15)
BUN: 45 mg/dL — ABNORMAL HIGH (ref 8–23)
CO2: 20 mmol/L — ABNORMAL LOW (ref 22–32)
Calcium: 8.3 mg/dL — ABNORMAL LOW (ref 8.9–10.3)
Chloride: 106 mmol/L (ref 98–111)
Creatinine, Ser: 1.54 mg/dL — ABNORMAL HIGH (ref 0.61–1.24)
GFR, Estimated: 44 mL/min — ABNORMAL LOW (ref >60)
Glucose, Bld: 102 mg/dL — ABNORMAL HIGH (ref 70–99)
Potassium: 4.3 mmol/L (ref 3.5–5.1)
Sodium: 134 mmol/L — ABNORMAL LOW (ref 135–145)

## 2024-07-27 LAB — CBC
HCT: 31.6 % — ABNORMAL LOW (ref 39.0–52.0)
Hemoglobin: 10.2 g/dL — ABNORMAL LOW (ref 13.0–17.0)
MCH: 28.7 pg (ref 26.0–34.0)
MCHC: 32.3 g/dL (ref 30.0–36.0)
MCV: 88.8 fL (ref 80.0–100.0)
Platelets: 159 K/uL (ref 150–400)
RBC: 3.56 MIL/uL — ABNORMAL LOW (ref 4.22–5.81)
RDW: 15 % (ref 11.5–15.5)
WBC: 8.3 K/uL (ref 4.0–10.5)
nRBC: 0 % (ref 0.0–0.2)

## 2024-07-27 LAB — MAGNESIUM: Magnesium: 1.8 mg/dL (ref 1.7–2.4)

## 2024-07-27 MED ORDER — FUROSEMIDE 40 MG PO TABS
40.0000 mg | ORAL_TABLET | Freq: Every day | ORAL | Status: DC
  Administered 2024-07-27: 40 mg via ORAL
  Filled 2024-07-27 (×2): qty 1

## 2024-07-27 MED ORDER — POTASSIUM CHLORIDE CRYS ER 20 MEQ PO TBCR
20.0000 meq | EXTENDED_RELEASE_TABLET | Freq: Every day | ORAL | Status: DC
  Administered 2024-07-27: 20 meq via ORAL
  Filled 2024-07-27: qty 1

## 2024-07-27 MED ORDER — PERFLUTREN LIPID MICROSPHERE
1.0000 mL | INTRAVENOUS | Status: AC | PRN
  Administered 2024-07-27: 2 mL via INTRAVENOUS

## 2024-07-27 MED ORDER — CHLORHEXIDINE GLUCONATE CLOTH 2 % EX PADS
6.0000 | MEDICATED_PAD | Freq: Every day | CUTANEOUS | Status: DC
  Administered 2024-07-28 (×2): 6 via TOPICAL

## 2024-07-27 MED ORDER — ORAL CARE MOUTH RINSE: 15.0000 mL | OROMUCOSAL | Status: DC | PRN

## 2024-07-27 NOTE — Progress Notes (Signed)
  Echocardiogram 2D Echocardiogram has been performed.  Tinnie FORBES Gosling RDCS 07/27/2024, 10:56 AM

## 2024-07-27 NOTE — TOC CM/SW Note (Signed)
 Transition of Care [facility]) - Inpatient Brief Assessment   Patient Details  Name: [name] MRN: [id number] Date of Birth: 28-Aug-1941  [facility] of [facility]) CM/SW Contact:    Lauraine FORBES Saa, LCSWA Phone Number: 07/27/2024, 3:25 PM   Clinical Narrative:  3:25 PM Per chart review, patient resides at home with spouse. Patient has insurance and does not have a PCP but is followed by Endocrinology, Cardiology, and Structural Heart Cardiology. Patient does not have SNF or HH history. Patient has DME (RW, rollator, cane, shower seat, wheelchair,) history. Patient's preferred pharmacy is [facility] [phone] [facility]. Per bedside RN, patient expressed interest in financial assistance for hospital bills. CSW consulted financial counseling. CSW provided SDOH (food) resources. TOC will continue to follow.  Transition of Care Asessment: Insurance and Status: Insurance coverage has been reviewed Patient has primary care physician: No (Followed by Cardiology, Endocrinology, and Structural Heart Cardiology) Home environment has been reviewed: Private Residence Prior level of function:: N/A Prior/Current Home Services: No current home services Social Drivers of Health Review: SDOH reviewed interventions complete Readmission risk has been reviewed: Yes (Currently Yellow 19%) Transition of care needs: transition of care needs identified, TOC will continue to follow

## 2024-07-27 NOTE — Telephone Encounter (Addendum)
 I faxed a referral to [facility] (Dr. Jettie office) for the patient to have dental extractions prior to his PCI. Received fax confirmation.

## 2024-07-27 NOTE — Consult Note (Addendum)
 ELECTROPHYSIOLOGY CONSULT NOTE    Patient ID: [name] MRN: [id number], DOB/AGE: December 13, 1940 83 y.o.  Admit date: 07/26/2024 Date of Consult: 07/27/2024  Primary Physician: Nichole Senior, MD Primary Cardiologist: Alm Clay, MD  Electrophysiologist: Dr. Inocencio Marny)  Referring Provider: Dr. Verlin / Rockie Hummer PA-C  Patient Profile: [name] is a 83 y.o. male with a history of RBBB, first-degree AV block, HTN, HLD, DM, CKD, hypothyroidism, OSA, bipolar disorder who is being seen today for the evaluation of complete heart block at the request of Dr. Verlin.  HPI:  [name] is a 83 y.o. male who presented to [facility] on 07/26/2024 with profound volume overload.  He was found to have severe biventricular heart failure and severe low-flow/low gradient aortic stenosis.  Advanced heart failure team was consulted and the patient was diuresed and started on inotropic support.  Echo demonstrated LVEF of 30 to 35% with global hypokinesis, severe hypokinesis of the mid apical anterior and anteroseptal walls.  AS mean gradient 33 mmHg.  He underwent left heart cath which showed 100% occlusion of circumflex with filling from right to left collaterals and severe mid LAD stenosis.  Patient was evaluated by the structural heart team and felt not to be a good candidate for conventional aortic valve replacement by surgical approach.  On 07/26/2024 he underwent TAVR with Dr. Shyrl and Dr. Verlin.  A 26 mm Edwards SAPIEN 3 THV valve was placed.  Patient had pre-existing RBBB and first-degree AV block prior to surgery.  In the postoperative phase he was noted to have complete heart block requiring TVP.   ICU exam on 07/27/2024 by EP with patient noted to be dependent at 40 bpm.  He denies chest pain, palpitations, PND, orthopnea, nausea, vomiting, dizziness, syncope, weight gain, or early satiety.   Labs Potassium4.3 (10/15 0424) Magnesium  1.8 (10/15 0424) Creatinine, ser  1.54*  (10/15 0424) PLT  159 (10/15 0424) HGB  10.2* (10/15 0424) WBC 8.3 (10/15 0424)  .    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 Verlin and Dr. Shyrl   Sleep apnea      Surgical History:  Past Surgical History:  Procedure Laterality Date   APPENDECTOMY     INTRAOPERATIVE TRANSTHORACIC ECHOCARDIOGRAM N/A 07/26/2024   Procedure: ECHOCARDIOGRAM, TRANSTHORACIC;  Surgeon: Verlin Lonni BIRCH, MD;  Location: [facility] INVASIVE CV LAB;  Service: Cardiovascular;  Laterality: N/A;   RIGHT HEART CATH AND CORONARY ANGIOGRAPHY N/A 07/18/2024   Procedure: RIGHT HEART CATH AND CORONARY ANGIOGRAPHY;  Surgeon: Zenaida Morene PARAS, MD;  Location: [facility] INVASIVE CV LAB;  Service: Cardiovascular;  Laterality: N/A;     Medications Prior to Admission  Medication Sig Dispense Refill Last Dose/Taking   aspirin 81 MG chewable tablet Chew 1 tablet (81 mg total) by mouth daily. 30 tablet 0 07/25/2024   atorvastatin (LIPITOR) 80 MG tablet Take 1 tablet (80 mg total) by mouth daily. 30 tablet 1 07/25/2024   fluticasone (FLONASE) 50 MCG/ACT nasal spray Place 2 sprays into both nostrils daily as needed for allergies.   Past Week   furosemide (LASIX) 40 MG tablet Take 1 tablet (40 mg total) by mouth daily. 30 tablet 1 07/25/2024   levothyroxine (SYNTHROID) 75  MCG tablet Take 75 mcg by mouth daily before breakfast.   07/25/2024   potassium chloride SA (KLOR-CON M) 20 MEQ tablet Take 1 tablet (20 mEq total) by mouth daily. 30 tablet 1 07/25/2024   carboxymethylcellulose (REFRESH PLUS) 0.5 % SOLN Place 1 drop into both eyes daily as needed (dry eyes).   More than a month   dapagliflozin propanediol (FARXIGA) 10 MG TABS tablet Take 1 tablet (10 mg total) by mouth  daily. 30 tablet 1 07/24/2024   docusate sodium  (COLACE) 100 MG capsule Take 200 mg by mouth 2 (two) times daily as needed for mild constipation.      polyethylene glycol (MIRALAX  / GLYCOLAX ) 17 g packet Take 17 g by mouth 2 (two) times daily as needed for mild constipation.   More than a month    Inpatient Medications:   aspirin EC  81 mg Oral Daily   atorvastatin  80 mg Oral Daily   furosemide  40 mg Oral Daily   levothyroxine  75 mcg Oral QAC breakfast   potassium chloride SA  20 mEq Oral Daily   sodium chloride  flush  3 mL Intravenous Q12H    Allergies: No Known Allergies  Family History  Problem Relation Age of Onset   CAD Father    Diabetes Mellitus II Maternal Grandmother      Physical Exam: Vitals:   07/27/24 0600 07/27/24 0700 07/27/24 0726 07/27/24 1154  BP:  109/81    Pulse: 69 70    Resp: 13 18    Temp:   (!) 97.3 F (36.3 C) (!) 97.3 F (36.3 C)  TempSrc:   Axillary Axillary  SpO2: 93% 96%    Weight:      Height:        GEN-elderly adult male in NAD, A&O x 3, normal affect PSY: Tearful in discussion about pacemaker HEENT: Normocephalic, atraumatic Lungs- CTAB, Normal effort.  Heart- Regular rate and rhythm, V-paced, no M/G/R.  GI- Soft, NT, ND.  Extremities- No clubbing, cyanosis, or edema   Radiology/Studies: ECHOCARDIOGRAM LIMITED Result Date: 07/26/2024    ECHOCARDIOGRAM LIMITED REPORT   Patient Name:   [name] Date of Exam: 07/26/2024 Medical Rec #:  [id number]   Height:       66.0 in Accession #:    [id number]  Weight:       153.3 lb Date of Birth:  11/16/1940    BSA:          1.786 m Patient Age:    83 years    BP:           160/93 mmHg Patient Gender: M           HR:           71 bpm. Exam Location:  Inpatient Procedure: Limited Echo, Limited Color Doppler and Cardiac Doppler (Both            Spectral and Color Flow Doppler were utilized during procedure). Indications:     aortic vavle replacement. TAVR.  History:         Patient has prior history  of Echocardiogram examinations, most                  recent 07/13/2024. CHF, CAD, chronic kidney disease; Risk                  Factors:Hypertension, Dyslipidemia, Diabetes and Sleep Apnea.  Aortic Valve: 26 mm Sapien prosthetic, stented (TAVR) valve is                  present in the aortic position. Procedure Date: 07/26/2024.  Sonographer:     Tinnie Barefoot RDCS Referring Phys:  [id number] LONNI JONETTA CASH Diagnosing Phys: Darryle Decent MD IMPRESSIONS  1. TTE guided TAVR. 26 mm S3. Vmax 1.0 m/s, MG 2 mmHG, EOA 3.15 cm2, no regurgitation or paravalvular leak. The aortic valve has been repaired/replaced. There is a 26 mm Sapien prosthetic (TAVR) valve present in the aortic position. Procedure Date: 07/26/2024.  2. Left ventricular ejection fraction, by estimation, is 25 to 30%. The left ventricle has severely decreased function. The left ventricle demonstrates global hypokinesis. There is moderate concentric left ventricular hypertrophy.  3. Right ventricular systolic function is moderately reduced. The right ventricular size is moderately enlarged.  4. The mitral valve is degenerative. Moderate mitral annular calcification. FINDINGS  Left Ventricle: Left ventricular ejection fraction, by estimation, is 25 to 30%. The left ventricle has severely decreased function. The left ventricle demonstrates global hypokinesis. The left ventricular internal cavity size was normal in size. There is moderate concentric left ventricular hypertrophy. Right Ventricle: The right ventricular size is moderately enlarged. No increase in right ventricular wall thickness. Right ventricular systolic function is moderately reduced. Pericardium: There is no evidence of pericardial effusion. Mitral Valve: The mitral valve is degenerative in appearance. Moderate mitral annular calcification. MV peak gradient, 16.3 mmHg. The mean mitral valve gradient is 6.0 mmHg. Tricuspid Valve: The tricuspid valve is grossly normal.  Tricuspid valve regurgitation is trivial. No evidence of tricuspid stenosis. Aortic Valve: TTE guided TAVR. 26 mm S3. Vmax 1.0 m/s, MG 2 mmHG, EOA 3.15 cm2, no regurgitation or paravalvular leak. The aortic valve has been repaired/replaced. Aortic valve mean gradient measures 2.0 mmHg. Aortic valve peak gradient measures 4.4 mmHg. Aortic valve area, by VTI measures 3.15 cm. There is a 26 mm Sapien prosthetic, stented (TAVR) valve present in the aortic position. Procedure Date: 07/26/2024. Pulmonic Valve: The pulmonic valve was grossly normal. Pulmonic valve regurgitation is trivial. No evidence of pulmonic stenosis. Aorta: The aortic root and ascending aorta are structurally normal, with no evidence of dilitation. Additional Comments: Spectral Doppler performed. Color Doppler performed.  LEFT VENTRICLE PLAX 2D LVIDd:         5.50 cm LVIDs:         4.90 cm LV PW:         1.30 cm LV IVS:        1.30 cm LVOT diam:     2.45 cm LV SV:         72 LV SV Index:   41 LVOT Area:     4.71 cm  LEFT ATRIUM         Index LA diam:    4.20 cm 2.35 cm/m  AORTIC VALVE AV Area (Vmax):    3.11 cm AV Area (Vmean):   3.15 cm AV Area (VTI):     3.15 cm AV Vmax:           105.00 cm/s AV Vmean:          68.600 cm/s AV VTI:            0.230 m AV Peak Grad:      4.4 mmHg AV Mean Grad:      2.0 mmHg LVOT Vmax:         69.35 cm/s LVOT Vmean:  45.900 cm/s LVOT VTI:          0.154 m LVOT/AV VTI ratio: 0.67  AORTA Ao Asc diam: 3.60 cm MITRAL VALVE MV Peak grad: 16.3 mmHg  SHUNTS MV Mean grad: 6.0 mmHg   Systemic VTI:  0.15 m MV Vmax:      2.02 m/s   Systemic Diam: 2.45 cm MV Vmean:     108.0 cm/s Darryle Decent MD Electronically signed by Darryle Decent MD Signature Date/Time: 07/26/2024/12:59:42 PM    Final    Structural Heart Procedure Result Date: 07/26/2024 See surgical note for result.  CT ANGIO ABDOMEN PELVIS  W &/OR WO CONTRAST Result Date: 07/20/2024 CLINICAL DATA:  Aortic valve replacement TAVR preoperative evaluation.  EXAM: CT ANGIOGRAPHY CHEST, ABDOMEN AND PELVIS TECHNIQUE: Multidetector CT imaging through the chest, abdomen and pelvis was performed using the standard protocol during bolus administration of intravenous contrast. Multiplanar reconstructed images and MIPs were obtained and reviewed to evaluate the vascular anatomy. RADIATION DOSE REDUCTION: This exam was performed according to the departmental dose-optimization program which includes automated exposure control, adjustment of the mA and/or kV according to patient size and/or use of iterative reconstruction technique. CONTRAST:  OMNIPAQUE  IOHEXOL  350 MG/ML SOLN COMPARISON:  CT abdomen pelvis 01/25/2022 FINDINGS: CTA CHEST FINDINGS Cardiovascular: Normal caliber thoracic aorta. No aortic dissection. Calcification in the aortic valve, thoracic aorta, and coronary arteries. Great vessel origins are patent. Cardiac enlargement. No pericardial effusions. Calcification in the mitral valve annulus. Mediastinum/Nodes: Thyroid  gland is unremarkable. Esophagus is decompressed. Lymph nodes demonstrated throughout the mediastinum without pathologic enlargement, likely reactive. Lungs/Pleura: Small right pleural effusion with basilar atelectasis. Peribronchial thickening and scarring most prominent in the right hilar region with mild bronchiectasis in the lung bases. No mucous plugging or focal lesions identified. This likely represents chronic bronchitis. Mild emphysematous changes in the lungs. No pneumothorax. Musculoskeletal: Degenerative changes in the spine. No acute bony abnormalities. Review of the MIP images confirms the above findings. CTA ABDOMEN AND PELVIS FINDINGS VASCULAR Aorta: Normal caliber aorta without aneurysm, dissection, vasculitis or significant stenosis. Diffuse aortic calcification and tortuosity. Celiac: Patent without evidence of aneurysm, dissection, vasculitis or significant stenosis. SMA: Patent without evidence of aneurysm, dissection,  vasculitis or significant stenosis. Renals: Duplicated right and triplicated left renal arteries are patent without evidence of aneurysm, dissection, vasculitis, fibromuscular dysplasia or significant stenosis. 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 Inflow: Very short bilateral common iliac arteries with high bifurcation into internal and external iliac arteries bilaterally. Tortuous external iliac arteries. Diffuse calcification. No aneurysm, dissection, or critical stenosis. Veins: No obvious venous abnormality within the limitations of this arterial phase study. Review of the MIP images confirms the above findings. NON-VASCULAR Hepatobiliary: No focal liver abnormality is seen. No gallstones, gallbladder wall thickening, or biliary dilatation. Pancreas: Atrophic pancreatic parenchyma. No ductal dilatation. Cyst in the tail of the pancreas measuring 11 mm diameter. This is enlarging since the previous study. No peripancreatic infiltration. Spleen: Normal in size without focal abnormality. Adrenals/Urinary Tract: No adrenal gland nodules. Kidneys are symmetrical with symmetrical nephrograms. No hydronephrosis or hydroureter. Bladder is normal. Stomach/Bowel: Stomach, small bowel, and colon are not abnormally distended. Diffusely stool-filled colon. No wall thickening or inflammatory stranding are identified. Appendix is not identified. Lymphatic: No significant lymphadenopathy. Reproductive: Prostate gland is enlarged. Other: Mild diffuse soft tissue edema in the subcutaneous fat. No free air or free fluid in the  abdomen. Abdominal wall musculature appears intact. Musculoskeletal: Lumbar scoliosis convex towards the right. Diffuse degenerative change in the lumbar spine. No acute bony abnormalities. Review of the MIP images confirms the above findings. IMPRESSION: 1. Diffuse aortic atherosclerosis.  Calcification of the aortic valve. No aneurysm or dissection involving the thoracic or abdominal aorta. 2. Mild cardiac enlargement with calcification of the mitral valve annulus. 3. Small right pleural effusion with basilar atelectasis. 4. Peribronchial changes with bronchiectasis likely representing chronic bronchitis. Mild emphysematous changes in the lungs. 5. Abdominal vascular variations including duplex right renal artery, triplex left renal artery, and high origin of the internal/external iliac arteries bilaterally. 6. No bowel obstruction or inflammation. 7. 11 mm cystic lesion in the tail of the pancreas, increasing in size since previous study. Recommend follow up pre and post contrast MRI/MRCP or pancreatic protocol CT in 2 years. This recommendation follows ACR consensus guidelines: Management of Incidental Pancreatic Cysts: A White Paper of the ACR Incidental Findings Committee. J Am Coll Radiol 2017;14:911-923. Electronically Signed   By: Elsie Gravely M.D.   On: 07/20/2024 17:46   CT ANGIO CHEST AORTA W/CM & OR WO/CM Result Date: 07/20/2024 CLINICAL DATA:  Aortic valve replacement TAVR preoperative evaluation. EXAM: CT ANGIOGRAPHY CHEST, ABDOMEN AND PELVIS TECHNIQUE: Multidetector CT imaging through the chest, abdomen and pelvis was performed using the standard protocol during bolus administration of intravenous contrast. Multiplanar reconstructed images and MIPs were obtained and reviewed to evaluate the vascular anatomy. RADIATION DOSE REDUCTION: This exam was performed according to the departmental dose-optimization program which includes automated exposure control, adjustment of the mA and/or kV according to patient size and/or use of iterative reconstruction technique. CONTRAST:  OMNIPAQUE  IOHEXOL  350 MG/ML SOLN COMPARISON:  CT abdomen pelvis 01/25/2022 FINDINGS: CTA CHEST FINDINGS Cardiovascular: Normal caliber thoracic aorta. No aortic dissection. Calcification in the aortic valve,  thoracic aorta, and coronary arteries. Great vessel origins are patent. Cardiac enlargement. No pericardial effusions. Calcification in the mitral valve annulus. Mediastinum/Nodes: Thyroid  gland is unremarkable. Esophagus is decompressed. Lymph nodes demonstrated throughout the mediastinum without pathologic enlargement, likely reactive. Lungs/Pleura: Small right pleural effusion with basilar atelectasis. Peribronchial thickening and scarring most prominent in the right hilar region with mild bronchiectasis in the lung bases. No mucous plugging or focal lesions identified. This likely represents chronic bronchitis. Mild emphysematous changes in the lungs. No pneumothorax. Musculoskeletal: Degenerative changes in the spine. No acute bony abnormalities. Review of the MIP images confirms the above findings. CTA ABDOMEN AND PELVIS FINDINGS VASCULAR Aorta: Normal caliber aorta without aneurysm, dissection, vasculitis or significant stenosis. Diffuse aortic calcification and tortuosity. Celiac: Patent without evidence of aneurysm, dissection, vasculitis or significant stenosis. SMA: Patent without evidence of aneurysm, dissection, vasculitis or significant stenosis. Renals: Duplicated right and triplicated left renal arteries are patent without evidence of aneurysm, dissection, vasculitis, fibromuscular dysplasia or significant stenosis. 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 Inflow: Very short bilateral common iliac arteries with high bifurcation into internal and external iliac arteries bilaterally. Tortuous external iliac arteries. Diffuse calcification. No aneurysm, dissection, or critical stenosis. Veins: No obvious venous abnormality within the limitations of this arterial phase study. Review of the MIP images confirms the above findings. NON-VASCULAR Hepatobiliary: No focal liver abnormality is  seen. No gallstones, gallbladder wall thickening, or biliary dilatation. Pancreas: Atrophic pancreatic parenchyma. No ductal dilatation. Cyst in the tail of the pancreas measuring 11 mm diameter. This is  enlarging since the previous study. No peripancreatic infiltration. Spleen: Normal in size without focal abnormality. Adrenals/Urinary Tract: No adrenal gland nodules. Kidneys are symmetrical with symmetrical nephrograms. No hydronephrosis or hydroureter. Bladder is normal. Stomach/Bowel: Stomach, small bowel, and colon are not abnormally distended. Diffusely stool-filled colon. No wall thickening or inflammatory stranding are identified. Appendix is not identified. Lymphatic: No significant lymphadenopathy. Reproductive: Prostate gland is enlarged. Other: Mild diffuse soft tissue edema in the subcutaneous fat. No free air or free fluid in the abdomen. Abdominal wall musculature appears intact. Musculoskeletal: Lumbar scoliosis convex towards the right. Diffuse degenerative change in the lumbar spine. No acute bony abnormalities. Review of the MIP images confirms the above findings. IMPRESSION: 1. Diffuse aortic atherosclerosis. Calcification of the aortic valve. No aneurysm or dissection involving the thoracic or abdominal aorta. 2. Mild cardiac enlargement with calcification of the mitral valve annulus. 3. Small right pleural effusion with basilar atelectasis. 4. Peribronchial changes with bronchiectasis likely representing chronic bronchitis. Mild emphysematous changes in the lungs. 5. Abdominal vascular variations including duplex right renal artery, triplex left renal artery, and high origin of the internal/external iliac arteries bilaterally. 6. No bowel obstruction or inflammation. 7. 11 mm cystic lesion in the tail of the pancreas, increasing in size since previous study. Recommend follow up pre and post contrast MRI/MRCP or pancreatic protocol CT in 2 years. This recommendation follows ACR consensus  guidelines: Management of Incidental Pancreatic Cysts: A White Paper of the ACR Incidental Findings Committee. J Am Coll Radiol 2017;14:911-923. Electronically Signed   By: Elsie Gravely M.D.   On: 07/20/2024 17:46   CT CORONARY MORPH W/CTA COR W/SCORE W/CA W/CM &/OR WO/CM Addendum Date: 07/20/2024 ADDENDUM REPORT: 07/20/2024 17:30 EXAM: OVER-READ INTERPRETATION CT CHEST The following report is an over-read performed by radiologist Dr. Rogelia Myers of [facility] on 07/20/2024. This over-read does not include interpretation of cardiac or coronary anatomy or pathology. The coronary CTA interpretation by the cardiologist is attached. COMPARISON:  July 15, 2024 FINDINGS: Pulmonary Embolism: No pulmonary embolism. Cardiovascular: Mild cardiomegaly with biatrial predominance. Trace pericardial effusion. Mediastinum/Nodes: No mediastinal mass. No mediastinal or hilar lymphadenopathy. Normal esophagus. Lungs/Pleura: The midline trachea and bronchi are patent. Small to moderate volume right pleural effusion, partially visualized, with right basilar atelectasis. No pneumothorax or left pleural effusion. Fibrolinear scarring in the lingula and left lower lobe. Musculoskeletal: No acute fracture or destructive bone lesion. Osteopenia. Multilevel degenerative disc disease of the spine. Thoracic DISH. Upper Abdomen: No acute abnormality in the partially visualized upper abdomen. IMPRESSION: Small to moderate volume right pleural effusion, partially visualized, with right basilar atelectasis. Electronically Signed   By: Rogelia Myers M.D.   On: 07/20/2024 17:30   Result Date: 07/20/2024 CLINICAL DATA:  Aortic valve replacement (TAVR), pre-op eval EXAM: Cardiac TAVR CT TECHNIQUE: The patient was scanned on a Siemens Force 192 slice scanner. A 120 kV retrospective scan was triggered in the descending thoracic aorta at 111 HU's. Gantry rotation speed was 270 msecs and collimation was .9 mm. The 3D data set  was reconstructed in 5% intervals of the R-R cycle. Systolic and diastolic phases were analyzed on a dedicated work station using MPR, MIP and VRT modes. The patient received 100mL OMNIPAQUE  IOHEXOL  350 MG/ML SOLN of contrast. FINDINGS: Motion artifact. Aortic Valve: Tricuspid aortic valve with severely reduced cusp excursion. Severely thickened and severely calcified aortic valve cusps. AV calcium score: 3343 Virtual Basal Annulus Measurements: Phase assessed 35%. Maximum/Minimum Diameter: 27.9 x 23 mm Perimeter: 78.7 mm  Area:  473 mm2 Mild LVOT calcifications. Membranous septal length: 5.9 mm Based on these measurements, the annulus would be suitable for a 26 mm Sapien 3 valve. Alternatively, Heart Team can consider 29 mm Evolut valve. Recommend Heart Team discussion for valve selection. Sinus of Valsalva Measurements: Non-coronary:  36 mm Right - coronary:  34 mm Left - coronary:  35 mm Coronary height and sinus of Valsalva Height: Left main: 17.5 mm, Left sinus: 22.6 mm Right coronary: 18.6 mm, Right sinus: 20.1 mm Aorta: Conventional 3 vessel branch pattern of aortic arch. Sinotubular Junction:  30 mm Ascending Thoracic Aorta:  36 mm Aortic Arch:  25 mm Descending Thoracic Aorta:  25 mm Coronary Arteries: Normal coronary origin. Right dominance. The study was performed without use of NTG and insufficient for plaque evaluation. Coronary artery calcium seen in 3 vessel distribution. Optimum Fluoroscopic Angle for Delivery: LAO 4 CAU 5 OTHER: Atria: Biatrial dilation Left atrial appendage: No thrombus, slow flowing contrast. Mitral valve: Grossly normal, mild-moderate mitral annular calcifications. Pulmonary artery: Mildly dilated. Pulmonary veins: Normal anatomy. IMPRESSION: 1. Tricuspid aortic valve with severely reduced cusp excursion. Severely thickened and severely calcified aortic valve cusps. 2. Aortic valve calcium score: 3343 3. Annulus area: 473 mm2, suitable for 26 mm Sapien 3 valve. Mild LVOT  calcifications. Membranous septal length 5.9 mm. 4. Sufficient coronary artery heights from annulus. 5. Optimum fluoroscopic angle for delivery:  LAO 4 CAU 5 Electronically Signed: By: Soyla Merck M.D. On: 07/20/2024 12:26   DG Orthopantogram Result Date: 07/19/2024 CLINICAL DATA:  Poor dentition.  Possible TAVR. EXAM: ORTHOPANTOGRAM/PANORAMIC COMPARISON:  None Available. FINDINGS: Numerous prior tooth extractions with dental reconstructions. Persistent left mandibular third molar demonstrated within the bone, possibly impacted. Dental caries are demonstrated in multiple remaining teeth. No definite bone erosion or focal bone lesion. IMPRESSION: Multiple tooth extractions, dental reconstructions, and dental caries demonstrated. Residual left mandibular third molar may be impacted. Electronically Signed   By: Elsie Gravely M.D.   On: 07/19/2024 20:02   CARDIAC CATHETERIZATION Result Date: 07/18/2024   Prox Cx lesion is 100% stenosed.   Mid LAD lesion is 90% stenosed. 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 Discussed with Dr. Mady, would plan for ideally staged PCI prior to TAVR Mildly elevated filling pressures Normal cardiac index by assumed Fick RECOMMENDATIONS: Discuss in structural conference   DG CHEST PORT 1 VIEW Result Date: 07/15/2024 CLINICAL DATA:  PICC line placement. EXAM: PORTABLE CHEST 1 VIEW COMPARISON:  07/14/2024 FINDINGS: Low volume film. The cardio pericardial silhouette is enlarged. There is pulmonary vascular congestion without overt pulmonary edema. Minimal basilar atelectasis noted with probable small right effusion. Right PICC line tip overlies the region of the SVC/RA junction.  Telemetry leads overlie the chest. IMPRESSION: 1. Right PICC line tip overlies the region of the SVC/RA junction. 2. Low volume film with vascular congestion and probable small right effusion. Electronically Signed   By: Camellia Candle M.D.   On: 07/15/2024 12:16   US  EKG SITE RITE Result Date: 07/15/2024 If Site Rite image not attached, placement could not be confirmed due to current  cardiac rhythm.  US  EKG SITE RITE Result Date: 07/15/2024 If Site Rite image not attached, placement could not be confirmed due to current cardiac rhythm.  DG Chest 1 View Result Date: 07/14/2024 EXAM: 1 VIEW(S) XRAY OF THE CHEST 07/14/2024 11:47:42 AM COMPARISON: 07/12/2024 CLINICAL HISTORY: Follow-up exam [id number]. Acute on chronic systolic CHF (congestive heart failure) (HCC). FINDINGS: LUNGS AND PLEURA: Stable moderate severity diffusely increased interstitial lung markings are seen with mild areas of atelectasis and scarring noted within the bilateral lung bases. There is a small stable right pleural effusion versus pleural thickening. No pneumothorax is identified. HEART AND MEDIASTINUM: The cardiac silhouette is mildly enlarged and unchanged in size. BONES AND SOFT TISSUES: No acute osseous abnormality. IMPRESSION: 1. Stable moderate interstitial lung markings with mild basilar atelectasis/scarring, unchanged. 2. Small right pleural effusion versus pleural thickening, stable. 3. Mild cardiomegaly, unchanged. Electronically signed by: [facility] MD 07/14/2024 02:21 PM EDT RP Workstation: [id number]   ECHOCARDIOGRAM COMPLETE Result Date: 07/13/2024    ECHOCARDIOGRAM REPORT   Patient Name:   [name] Date of Exam: 07/13/2024 Medical Rec #:  [id number]   Height:       66.0 in Accession #:    [id number]  Weight:       182.1 lb Date of Birth:  08-12-1941    BSA:          1.922 m Patient Age:    83 years    BP:           135/83 mmHg Patient Gender: M           HR:           62 bpm. Exam Location:  Inpatient Procedure: 2D  Echo, Cardiac Doppler, Color Doppler and Intracardiac            Opacification Agent (Both Spectral and Color Flow Doppler were            utilized during procedure). Indications:    CHF-Acute Systolic I50.21  History:        Patient has no prior history of Echocardiogram examinations.                 CHF, CAD, CKD, stage 3; Risk Factors:Hypertension, Dyslipidemia,                 Sleep Apnea and Diabetes.  Sonographer:    Thea Norlander RCS Referring Phys: [id number] MARSA NOVAK MELVIN IMPRESSIONS  1. No left ventricular thrombus is seen (Definity contrast was used). Although there is global hypokinesis, there is disproportionately severe hypokinesis/akinesis in the mid-apical left anterior and anteroseptal walls. Left ventricular ejection fraction, by estimation, is 30 to 35%. The left ventricle has moderately decreased function. The left ventricle demonstrates regional wall motion abnormalities (see scoring diagram/findings for description). Left ventricular diastolic parameters are consistent with Grade II diastolic dysfunction (pseudonormalization). Elevated left atrial pressure. There is the interventricular septum is flattened in systole, consistent with right ventricular pressure overload.  2. Right ventricular systolic function is moderately reduced. The right ventricular size is mildly enlarged. Tricuspid regurgitation signal is inadequate for assessing PA pressure.  3. Left atrial size was mildly dilated.  4. Right atrial size was mildly dilated.  5. The mitral valve is degenerative. Mild mitral valve regurgitation. No evidence of mitral stenosis. The mean mitral valve gradient is 3.0 mmHg with average heart rate of 66 bpm. Moderate to severe mitral annular calcification.  6. The aortic valve is tricuspid. There is severe calcifcation of the aortic  valve. There is severe thickening of the aortic valve. Aortic valve regurgitation is trivial. Severe aortic valve stenosis.  7. The inferior vena cava is  dilated in size with <50% respiratory variability, suggesting right atrial pressure of 15 mmHg. FINDINGS  Left Ventricle: No left ventricular thrombus is seen (Definity contrast was used). Although there is global hypokinesis, there is disproportionately severe hypokinesis/akinesis in the mid-apical left anterior and anteroseptal walls. Left ventricular ejection fraction, by estimation, is 30 to 35%. The left ventricle has moderately decreased function. The left ventricle demonstrates regional wall motion abnormalities. Definity contrast agent was given IV to delineate the left ventricular endocardial borders. The left ventricular internal cavity size was normal in size. There is borderline concentric left ventricular hypertrophy. The interventricular septum is flattened in systole, consistent with right ventricular pressure overload. Left ventricular  diastolic parameters are consistent with Grade II diastolic dysfunction (pseudonormalization). Elevated left atrial pressure.  LV Wall Scoring: The mid and distal anterior wall and entire apex are akinetic. The antero-lateral wall, anterior septum, inferior wall, posterior wall, mid inferoseptal segment, basal anterior segment, and basal inferoseptal segment are hypokinetic. Right Ventricle: The right ventricular size is mildly enlarged. No increase in right ventricular wall thickness. Right ventricular systolic function is moderately reduced. Tricuspid regurgitation signal is inadequate for assessing PA pressure. Left Atrium: Left atrial size was mildly dilated. Right Atrium: Right atrial size was mildly dilated. Pericardium: There is no evidence of pericardial effusion. Mitral Valve: The mitral valve is degenerative in appearance. Moderate to severe mitral annular calcification. Mild mitral valve regurgitation. No evidence of mitral valve stenosis. MV peak gradient, 9.4 mmHg. The mean mitral valve gradient is 3.0 mmHg with average heart rate of 66 bpm. Tricuspid  Valve: The tricuspid valve is normal in structure. Tricuspid valve regurgitation is mild. Aortic Valve: The aortic valve is tricuspid. There is severe calcifcation of the aortic valve. There is severe thickening of the aortic valve. Aortic valve regurgitation is trivial. Severe aortic stenosis is present. Aortic valve mean gradient measures 33.0 mmHg. Aortic valve peak gradient measures 61.8 mmHg. Aortic valve area, by VTI measures 0.77 cm. Pulmonic Valve: The pulmonic valve was normal in structure. Pulmonic valve regurgitation is mild. No evidence of pulmonic stenosis. Aorta: The aortic root and ascending aorta are structurally normal, with no evidence of dilitation. Venous: The inferior vena cava is dilated in size with less than 50% respiratory variability, suggesting right atrial pressure of 15 mmHg. IAS/Shunts: No atrial level shunt detected by color flow Doppler.  LEFT VENTRICLE PLAX 2D LVIDd:         4.92 cm   Diastology LVIDs:         4.16 cm   LV e' medial:    3.00 cm/s LV PW:         1.14 cm   LV E/e' medial:  43.0 LV IVS:        1.20 cm   LV e' lateral:   5.81 cm/s LVOT diam:     2.26 cm   LV E/e' lateral: 22.2 LV SV:         79 LV SV Index:   41 LVOT Area:     4.01 cm  RIGHT VENTRICLE            IVC RV S prime:     6.87 cm/s  IVC diam: 2.60 cm TAPSE (M-mode): 1.5 cm LEFT ATRIUM             Index  RIGHT ATRIUM           Index LA diam:        4.33 cm 2.25 cm/m   RA Area:     21.60 cm LA Vol (A2C):   57.2 ml 29.77 ml/m  RA Volume:   66.10 ml  34.40 ml/m LA Vol (A4C):   42.1 ml 21.91 ml/m LA Biplane Vol: 54.6 ml 28.41 ml/m  AORTIC VALVE AV Area (Vmax):    0.75 cm AV Area (Vmean):   0.72 cm AV Area (VTI):     0.77 cm AV Vmax:           393.00 cm/s AV Vmean:          265.500 cm/s AV VTI:            1.020 m AV Peak Grad:      61.8 mmHg AV Mean Grad:      33.0 mmHg LVOT Vmax:         73.90 cm/s LVOT Vmean:        47.700 cm/s LVOT VTI:          0.197 m LVOT/AV VTI ratio: 0.19  AORTA Ao Root diam:  3.79 cm Ao Asc diam:  3.65 cm MITRAL VALVE MV Area (PHT): 3.85 cm     SHUNTS MV Area VTI:   2.05 cm     Systemic VTI:  0.20 m MV Peak grad:  9.4 mmHg     Systemic Diam: 2.26 cm MV Mean grad:  3.0 mmHg MV Vmax:       1.53 m/s MV Vmean:      77.0 cm/s MV Decel Time: 197 msec MV E velocity: 129.00 cm/s MV A velocity: 84.40 cm/s MV E/A ratio:  1.53 Mihai Croitoru MD Electronically signed by Jerel Balding MD Signature Date/Time: 07/13/2024/2:29:54 PM    Final    DG Chest 2 View Result Date: 07/12/2024 CLINICAL DATA:  Shortness of breath and edema. EXAM: CHEST - 2 VIEW COMPARISON:  01/25/2022 FINDINGS: Lungs are somewhat hypoinflated demonstrate mild bibasilar opacification compatible with small bilateral pleural effusions likely with associated basilar atelectasis. Mild hazy prominence of the central pulmonary vessels likely a degree of vascular congestion. Mild cardiomegaly. Remainder of the exam is unchanged. IMPRESSION: 1. Mild cardiomegaly with suggestion of mild vascular congestion. 2. Small bilateral pleural effusions with associated bibasilar atelectasis. Electronically Signed   By: Toribio Agreste M.D.   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 Inocencio reviewed risks / benefits with patient at bedside -assess TSH  -ECHO with LVEF 25-30% pre-TAVR, QRS pre TAVR 148 ms, paced QRS is 214 ms, anticipate CRT-P      For questions or updates, please contact [facility] Please consult [email or web address] for contact info under     Signed, Daphne Barrack, NP-C, AGACNP-BC [facility] - Electrophysiology  07/27/2024, 12:26 PM     [name] was seen by me today along with Daphne Barrack. I have personally performed an evaluation on this patient.  My findings are as follows: 83 y.o.  male with a past medical history as above.  He presented to the hospital 07/26/2024 with profound volume overload.  He was found to have severe biventricular heart failure and severe aortic stenosis.  Left heart catheterization showed occlusion of the circumflex and severe mid LAD stenosis.  He is post TAVR 07/26/2024.  Post TAVR, he has developed complete heart block.  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  He is lying in bed without acute complaint.  Ventricular paced on telemetry..  Data: EKG(s) and pertinent labs, studies, etc were personally reviewed and interpreted by me:  EKG, telemetry,Labs Otherwise, I agree with data as outlined by the advanced practice provider.  Exam performed by me: Gen: No acute distress Neck: No JVD Cardiac: Regular rhythm Lungs: Normal work of breathing Extremities: No edema  My Assessment and Plan:  1.  Complete heart block 2.  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  Aortic stenosis  Patient is post TAVR implant.  He has complete heart block after implantation.  He [name] likely require pacemaker implant with pre-existing right bundle branch block.  Risks and benefits have been discussed.  Due to his ischemic cardiomyopathy and reduced ejection fraction, would benefit from CRT-P.    Explained risks, benefits, and alternatives to PPM implantation, including but not limited to bleeding, infection, pneumothorax, pericardial effusion, lead dislodgement, heart attack, stroke, or death.  Pt verbalized understanding and agrees to proceed.   Signed,  David Mares Gladis Norton, MD  07/27/2024 1:26 PM

## 2024-07-28 ENCOUNTER — Other Ambulatory Visit (HOSPITAL_COMMUNITY)

## 2024-07-28 ENCOUNTER — Encounter (HOSPITAL_COMMUNITY): Payer: Self-pay

## 2024-07-28 ENCOUNTER — Inpatient Hospital Stay (HOSPITAL_COMMUNITY)
Admission: RE | Disposition: A | Discharge status: Home / Self Care | Payer: Self-pay | Source: Home / Self Care | Attending: Cardiovascular Disease

## 2024-07-28 DIAGNOSIS — I442 Atrioventricular block, complete: Secondary | ICD-10-CM | POA: Diagnosis not present

## 2024-07-28 DIAGNOSIS — Z952 Presence of prosthetic heart valve: Secondary | ICD-10-CM | POA: Diagnosis not present

## 2024-07-28 DIAGNOSIS — I2510 Atherosclerotic heart disease of native coronary artery without angina pectoris: Secondary | ICD-10-CM | POA: Diagnosis not present

## 2024-07-28 HISTORY — PX: PACEMAKER IMPLANT: EP1218

## 2024-07-28 LAB — BASIC METABOLIC PANEL WITH GFR
Anion gap: 10 (ref 5–15)
BUN: 37 mg/dL — ABNORMAL HIGH (ref 8–23)
CO2: 21 mmol/L — ABNORMAL LOW (ref 22–32)
Calcium: 8.6 mg/dL — ABNORMAL LOW (ref 8.9–10.3)
Chloride: 105 mmol/L (ref 98–111)
Creatinine, Ser: 1.34 mg/dL — ABNORMAL HIGH (ref 0.61–1.24)
GFR, Estimated: 52 mL/min — ABNORMAL LOW (ref >60)
Glucose, Bld: 78 mg/dL (ref 70–99)
Potassium: 4.4 mmol/L (ref 3.5–5.1)
Sodium: 136 mmol/L (ref 135–145)

## 2024-07-28 LAB — CBC
HCT: 33.1 % — ABNORMAL LOW (ref 39.0–52.0)
Hemoglobin: 10.7 g/dL — ABNORMAL LOW (ref 13.0–17.0)
MCH: 29.2 pg (ref 26.0–34.0)
MCHC: 32.3 g/dL (ref 30.0–36.0)
MCV: 90.2 fL (ref 80.0–100.0)
Platelets: 165 K/uL (ref 150–400)
RBC: 3.67 MIL/uL — ABNORMAL LOW (ref 4.22–5.81)
RDW: 15.1 % (ref 11.5–15.5)
WBC: 7.1 K/uL (ref 4.0–10.5)
nRBC: 0 % (ref 0.0–0.2)

## 2024-07-28 LAB — GLUCOSE, CAPILLARY
Glucose-Capillary: 70 mg/dL (ref 70–99)
Glucose-Capillary: 63 mg/dL — ABNORMAL LOW (ref 70–99)
Glucose-Capillary: 102 mg/dL — ABNORMAL HIGH (ref 70–99)
Glucose-Capillary: 76 mg/dL (ref 70–99)
Glucose-Capillary: 141 mg/dL — ABNORMAL HIGH (ref 70–99)
Glucose-Capillary: 114 mg/dL — ABNORMAL HIGH (ref 70–99)

## 2024-07-28 LAB — MAGNESIUM: Magnesium: 1.9 mg/dL (ref 1.7–2.4)

## 2024-07-28 MED ORDER — CHLORHEXIDINE GLUCONATE 4 % EX SOLN: 60.0000 mL | Freq: Once | CUTANEOUS | Status: AC

## 2024-07-28 MED ORDER — SODIUM CHLORIDE 0.9 % IV SOLN: INTRAVENOUS | Status: DC

## 2024-07-28 MED ORDER — MIDAZOLAM HCL 2 MG/2ML IJ SOLN
INTRAMUSCULAR | Status: AC
  Filled 2024-07-28: qty 2

## 2024-07-28 MED ORDER — FENTANYL CITRATE (PF) 100 MCG/2ML IJ SOLN
INTRAMUSCULAR | Status: AC
  Filled 2024-07-28: qty 2

## 2024-07-28 MED ORDER — SODIUM CHLORIDE 0.9% FLUSH
3.0000 mL | Freq: Two times a day (BID) | INTRAVENOUS | Status: DC
  Administered 2024-07-28 (×2): 3 mL via INTRAVENOUS

## 2024-07-28 MED ORDER — CEFAZOLIN SODIUM-DEXTROSE 2-4 GM/100ML-% IV SOLN
INTRAVENOUS | Status: AC
  Administered 2024-07-28: 2 g via INTRAVENOUS
  Filled 2024-07-28: qty 100

## 2024-07-28 MED ORDER — MIDAZOLAM HCL 5 MG/5ML IJ SOLN
INTRAMUSCULAR | Status: DC | PRN
  Administered 2024-07-28: 1 mg via INTRAVENOUS

## 2024-07-28 MED ORDER — DEXTROSE 50 % IV SOLN
INTRAVENOUS | Status: AC
  Administered 2024-07-28: 12.5 g via INTRAVENOUS
  Filled 2024-07-28: qty 50

## 2024-07-28 MED ORDER — CEFAZOLIN SODIUM-DEXTROSE 2-4 GM/100ML-% IV SOLN: 2.0000 g | INTRAVENOUS | Status: AC

## 2024-07-28 MED ORDER — FENTANYL CITRATE (PF) 100 MCG/2ML IJ SOLN
INTRAMUSCULAR | Status: DC | PRN
  Administered 2024-07-28: 25 ug via INTRAVENOUS

## 2024-07-28 MED ORDER — MAGNESIUM SULFATE 2 GM/50ML IV SOLN
2.0000 g | Freq: Once | INTRAVENOUS | Status: AC
  Administered 2024-07-28: 2 g via INTRAVENOUS
  Filled 2024-07-28: qty 50

## 2024-07-28 MED ORDER — HEPARIN (PORCINE) IN NACL 1000-0.9 UT/500ML-% IV SOLN
INTRAVENOUS | Status: DC | PRN
  Administered 2024-07-28: 500 mL

## 2024-07-28 MED ORDER — SODIUM CHLORIDE 0.9% FLUSH: 3.0000 mL | INTRAVENOUS | Status: DC | PRN

## 2024-07-28 MED ORDER — LIDOCAINE HCL (PF) 1 % IJ SOLN
INTRAMUSCULAR | Status: AC
  Filled 2024-07-28: qty 60

## 2024-07-28 MED ORDER — SODIUM CHLORIDE 0.9 % IV SOLN
INTRAVENOUS | Status: AC
  Administered 2024-07-28: 80 mg
  Filled 2024-07-28: qty 2

## 2024-07-28 MED ORDER — DEXTROSE 50 % IV SOLN: 25.0000 mL | Freq: Once | INTRAVENOUS | Status: DC

## 2024-07-28 MED ORDER — DEXTROSE 50 % IV SOLN: 12.5000 g | INTRAVENOUS | Status: AC

## 2024-07-28 MED ORDER — LIDOCAINE HCL (PF) 1 % IJ SOLN
INTRAMUSCULAR | Status: DC | PRN
  Administered 2024-07-28: 40 mL

## 2024-07-28 MED ORDER — SODIUM CHLORIDE 0.9 % IV SOLN
80.0000 mg | INTRAVENOUS | Status: AC
  Filled 2024-07-28: qty 2

## 2024-07-28 NOTE — Anesthesia Postprocedure Evaluation (Signed)
 Anesthesia Post Note  Patient: [name]  Procedure(s) Performed: Transcatheter Aortic Valve Replacement, Transfemoral ECHOCARDIOGRAM, TRANSTHORACIC     Patient location during evaluation: Cath Lab Anesthesia Type: MAC Level of consciousness: awake and alert Pain management: pain level controlled Vital Signs Assessment: post-procedure vital signs reviewed and stable Respiratory status: spontaneous breathing, nonlabored ventilation and respiratory function stable Cardiovascular status: stable and blood pressure returned to baseline Postop Assessment: no apparent nausea or vomiting Anesthetic complications: no   There were no known notable events for this encounter.  Last Vitals:  Vitals:   07/28/24 1100 07/28/24 1130  BP: (!) 135/90   Pulse: 69 69  Resp: 19 13  Temp:  36.9 C  SpO2: 93% 99%    Last Pain:  Vitals:   07/28/24 1130  TempSrc: Oral  PainSc:                  Teonia Yager

## 2024-07-28 NOTE — Plan of Care (Signed)
  Problem: Education: Goal: Knowledge of General Education information will improve Description: Including pain rating scale, medication(s)/side effects and non-pharmacologic comfort measures Outcome: Progressing   Problem: Health Behavior/Discharge Planning: Goal: Ability to manage health-related needs will improve Outcome: Progressing   Problem: Clinical Measurements: Goal: Ability to maintain clinical measurements within normal limits will improve Outcome: Progressing Goal: Will remain free from infection Outcome: Progressing Goal: Diagnostic test results will improve Outcome: Progressing Goal: Respiratory complications will improve Outcome: Progressing Goal: Cardiovascular complication will be avoided Outcome: Progressing   Problem: Nutrition: Goal: Adequate nutrition will be maintained Outcome: Progressing   Problem: Coping: Goal: Level of anxiety will decrease Outcome: Progressing   Problem: Pain Managment: Goal: General experience of comfort will improve and/or be controlled Outcome: Progressing   Problem: Safety: Goal: Ability to remain free from injury will improve Outcome: Progressing   Problem: Skin Integrity: Goal: Risk for impaired skin integrity will decrease Outcome: Progressing   Problem: Education: Goal: Knowledge of cardiac device and self-care will improve Outcome: Progressing Goal: Ability to safely manage health related needs after discharge will improve Outcome: Progressing   Problem: Cardiac: Goal: Ability to achieve and maintain adequate cardiopulmonary perfusion will improve Outcome: Progressing

## 2024-07-28 NOTE — Progress Notes (Addendum)
 [facility]   MULTIDISCIPLINARY HEART VALVE TEAM  Patient Name: [name] Date of Encounter: 07/28/2024  Admit date: 07/26/2024   PCP:  Nichole Senior, MD  [facility] Cardiologist:  Alm Clay, MD  [facility] Structural heart: Lonni Cash, MD [facility] Electrophysiologist:  None   Hospital Problem List     Principal Problem:   S/P TAVR (transcatheter aortic valve replacement) Active Problems:   Essential hypertension   Acquired hypothyroidism   Bipolar disorder (HCC)   Hyperlipidemia   BPH (benign prostatic hyperplasia)   Obesity   Stage 3a chronic kidney disease (HCC)   Sleep apnea   Acute on chronic systolic CHF (congestive heart failure) (HCC)   Biventricular heart failure, NYHA class 3 (HCC)   Symptomatic severe aortic stenosis with low ejection fraction   Coronary artery disease     Subjective   No complaints. Waiting for pacemaker.   Inpatient Medications    Scheduled Meds:  aspirin EC  81 mg Oral Daily   atorvastatin  80 mg Oral Daily   Chlorhexidine Gluconate Cloth  6 each Topical Daily   furosemide  40 mg Oral Daily   levothyroxine  75 mcg Oral QAC breakfast   potassium chloride SA  20 mEq Oral Daily   sodium chloride  flush  3 mL Intravenous Q12H   Continuous Infusions:  sodium chloride      magnesium sulfate bolus IVPB 2 g (07/28/24 1229)   PRN Meds: sodium chloride , acetaminophen  **OR** acetaminophen , docusate sodium , morphine injection, ondansetron (ZOFRAN) IV, mouth rinse, oxyCODONE, sodium chloride  flush, traMADol    Vital Signs    Vitals:   07/28/24 0900 07/28/24 1000 07/28/24 1100 07/28/24 1130  BP: 121/86 (!) 131/101 (!) 135/90   Pulse: 69 69 69 69  Resp: (!) 23 (!) 21 19 13   Temp:    98.5 F (36.9 C)  TempSrc:    Oral  SpO2: 93% 93% 93% 99%  Weight:      Height:        Intake/Output Summary (Last 24 hours) at 07/28/2024 1316 Last data filed at 07/28/2024 0800 Gross per 24 hour  Intake  20 ml  Output 600 ml  Net -580 ml   Filed Weights   07/26/24 0625 07/27/24 0500 07/28/24 0557  Weight: 69.5 kg 71.4 kg 69.8 kg    Physical Exam    GEN: Well nourished, well developed, in no acute distress.  HEENT: Grossly normal.  Neck: Supple, no JVD or masses. RIJ in place.  Cardiac: RRR, no murmurs, rubs, or gallops. No clubbing, cyanosis, edema.   Respiratory:  Respirations regular and unlabored, clear to auscultation bilaterally. GI: Soft, nontender, nondistended, BS + x 4. MS: no deformity or atrophy. Skin: warm and dry, no rash.  Groin sites clear without hematoma or ecchymosis Neuro:  Strength and sensation are intact. Psych: AAOx3.  Normal affect.  Labs    CBC Recent Labs    07/27/24 0424 07/28/24 0523  WBC 8.3 7.1  HGB 10.2* 10.7*  HCT 31.6* 33.1*  MCV 88.8 90.2  PLT 159 165   Basic Metabolic Panel: Recent Labs  Lab 07/25/24 0850 07/26/24 0948 07/27/24 0424 07/28/24 0523  NA 139 140 134* 136  K 4.4 4.9 4.3 4.4  CL 105 106 106 105  CO2 25  --  20* 21*  GLUCOSE 127* 110* 102* 78  BUN 46* 45* 45* 37*  CREATININE 1.34* 1.40* 1.54* 1.34*  CALCIUM 9.2  --  8.3* 8.6*  MG  --   --  1.8 1.9   GFR: Estimated Creatinine Clearance: 37 mL/min (A) (by C-G formula based on SCr of 1.34 mg/dL (H)). Recent Labs  Lab 07/25/24 0850 07/27/24 0424 07/28/24 0523  WBC 8.3 8.3 7.1   Liver Function Tests No results for input(s): AST, ALT, ALKPHOS, BILITOT, PROT, ALBUMIN in the last 72 hours.   Telemetry    Vpaced - pacer dependant underneath- Personally Reviewed  ECG    Vpaced HR 71 bpm - Personally Reviewed  Patient Profile     [name] is a 83 y.o. male with a history of  HTN, HLD, DM, CKD stage IIIb, RBBB, 1st degree AV block, hypothyroidism, sleep apnea, bipolar disorder, severe biventricular heart failure and severe low flow/low gradient aortic stenosis who presented to [facility] on 07/26/24 for planned TAVR.   Assessment & Plan    Severe  AS:  -- S/p successful TAVR with a 26 mm Edwards Sapien 3 Ultra Resilia THV via the TF approach on 07/26/24.  -- Post operative echo showed EF 25-30%, mild RVE, normally functioning TAVR with a mean gradient of 3 mmHg and no PVL. -- Groin sites are stable.  -- Continue Asprin 81mg  daily.    CHB: -- Underlying RBBB/1st deg AV block. -- Pacer dependant s/p TAVR and transferred to ICU.  -- EP has recommended pacemaker placement, hopefully today.    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 -- LVEDP 21 mm Hg. -- Treated with dose of IV Lasix 40mg  and KCL 20 meq post operatively. -- Resumed on home Lasix 40 mg daily and KCl 20 mEq daily. -- Hold Farxiga 10 mg daily until after PPM.    CAD -- LHC w/ severe LAD disease and occluded LCx w/ R>>L collaterals -- Will need staged PCI after TAVR. Plan to do this after dental extractions, depending on when those will be scheduled.  -- Continue atorva 80 mg. -- Continue ASA 81 mg.   CKD IIIa -- Scr baseline ~ 1.3-1.6. -- Creat stable at 1.34 today    Poor dentition: -- Will need full mouth extractions after TAVR.  -- Referral sent to Dr. Celena.  Signed, Lonni Cash, MD  07/28/2024, 1:16 PM  Pager [phone]  I have personally seen and examined this patient. I agree with the assessment and plan as outlined above.  He is doing well today. No complaints.  Still requiring pacing.  BP stable Tele reviewed by me- Vent paced EKG with V pacing Labs reviewed by me Exam: NAD, Regular rate and rhythm, Lungs clear bilaterally, Ext: No edema. No groin hematoma Plan: Severe AS: POD 2 post TAVR. Doing well CHB: EP team has seen him. Planning permanent pacemaker today CAD without angina: Plan staged PCI of the LAD following dental extractions  Lonni Cash, MD, [facility] 07/28/2024 1:16 PM

## 2024-07-28 NOTE — Progress Notes (Signed)
 Hypoglycemic Event  CBG: 63 at 10:25am  Treatment: D50 25 mL (12.5 gm)  Symptoms: Nervous/irritable and dizzy/lightheaded  Follow-up CBG: Time:10:57am CBG Result:102  Possible Reasons for Event: Inadequate meal intake and Other: Pt NPO or surgery  Comments/MD notified: Will notify Cards NP  [place], [employer]

## 2024-07-28 NOTE — Progress Notes (Addendum)
 Patient Name: [name] Date of Encounter: 07/28/2024  Primary Cardiologist: Alm Clay, MD Electrophysiologist: None  Interval Summary   The patient is doing well today.  At this time, the patient denies chest pain, shortness of breath, or any new concerns.  Vital Signs    Vitals:   07/28/24 0630 07/28/24 0645 07/28/24 0700 07/28/24 0759  BP:      Pulse: 70 69 69   Resp: 13 12 16    Temp:    98.7 F (37.1 C)  TempSrc:    Oral  SpO2: 91% (!) 89% 93%   Weight:      Height:        Intake/Output Summary (Last 24 hours) at 07/28/2024 9177 Last data filed at 07/28/2024 0700 Gross per 24 hour  Intake --  Output 600 ml  Net -600 ml   Filed Weights   07/26/24 0625 07/27/24 0500 07/28/24 0557  Weight: 69.5 kg 71.4 kg 69.8 kg    Physical Exam    GEN- NAD, Alert and oriented  Lungs- Clear to ausculation bilaterally, normal work of breathing Cardiac- Regular rate and rhythm (VP), no murmurs, rubs or gallops GI- soft, NT, ND, + BS Extremities- no clubbing or cyanosis. No edema  Telemetry    VP 70's, rare PVC (personally reviewed)  Hospital Course    [name] is a 83 y.o. male with a history of RBBB, first-degree AV block, HTN, HLD, DM, CKD, hypothyroidism, OSA, bipolar disorder admitted 07/26/24 with volume overload in the setting of severe biventricular heart failure and severe low-flow/low gradient aortic stenosis.  Advanced heart failure team was consulted and the patient was diuresed and started on inotropic support.  Echo demonstrated LVEF of 30 to 35% with global hypokinesis, severe hypokinesis of the mid apical anterior and anteroseptal walls.  AS mean gradient 33 mmHg.  He underwent left heart cath which showed 100% occlusion of circumflex with filling from right to left collaterals and severe mid LAD stenosis.  Patient was evaluated by the structural heart team and felt not to be a good candidate for conventional aortic valve replacement by surgical approach.   On 07/26/2024 he underwent TAVR with Dr. Shyrl and Dr. Verlin.  A 26 mm Edwards SAPIEN 3 THV valve was placed.  Patient had pre-existing RBBB and first-degree AV block prior to surgery.  In the postoperative phase he was noted to have complete heart block requiring TVP.      Assessment & Plan    Complete Heart Block s/p TAVR Pre-Existing RBBB, 1AVB -continue TVP VVI 70 bpm  -anticipate PPM 10/16  -ok for clear liquid breakfast > NPO after 0930 -LVEF 25-30%, pre-TAVR QRS 148 ms and anticipate >40% pacing,  consider CRT-P -dependent at 40 bpm, 1.5V  -risks / benefits of procedure reviewed per Dr. 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 I have personally performed an evaluation on this patient.  My findings are as follows: 83 y.o. male feeling well without acute complaint.  Data: EKG(s) and pertinent labs, studies, etc were personally reviewed and interpreted by me:  EKG, telemetry, labs Otherwise, I agree with data  as outlined by the advanced practice provider.  Exam performed by me: Gen: No acute distress Neck: No JVD Cardiac: Regular Lungs: Normal work of breathing Extremities: No edema  My Assessment and Plan: Complete heart block: Patient is status post TAVR.  He had pre-existing right bundle branch block.  He is dependent on his temporary pacing wire.  He would benefit from pacemaker implant.  Due to heart failure, plan for CRT-P.  Risks and benefits of been discussed.  The patient understands the risks and has agreed to the procedure.  Explained risks, benefits, and alternatives to PPM implantation,  including but not limited to bleeding, infection, pneumothorax, pericardial effusion, lead dislodgement, heart attack, stroke, or death.  Pt verbalized understanding and agrees to proceed.  2.  Aortic stenosis: Post TAVR.  Plan per primary team.  Signed,  Jinny Sweetland Gladis Norton, MD  07/28/2024 1:27 PM

## 2024-07-29 ENCOUNTER — Inpatient Hospital Stay (HOSPITAL_COMMUNITY)

## 2024-07-29 ENCOUNTER — Encounter (HOSPITAL_COMMUNITY): Payer: Self-pay | Admitting: Cardiology

## 2024-07-29 DIAGNOSIS — Z952 Presence of prosthetic heart valve: Secondary | ICD-10-CM

## 2024-07-29 DIAGNOSIS — I350 Nonrheumatic aortic (valve) stenosis: Secondary | ICD-10-CM | POA: Diagnosis not present

## 2024-07-29 DIAGNOSIS — I5023 Acute on chronic systolic (congestive) heart failure: Secondary | ICD-10-CM | POA: Diagnosis not present

## 2024-07-29 DIAGNOSIS — I442 Atrioventricular block, complete: Secondary | ICD-10-CM | POA: Diagnosis not present

## 2024-07-29 DIAGNOSIS — K862 Cyst of pancreas: Secondary | ICD-10-CM | POA: Diagnosis not present

## 2024-07-29 LAB — GLUCOSE, CAPILLARY
Glucose-Capillary: 93 mg/dL (ref 70–99)
Glucose-Capillary: 132 mg/dL — ABNORMAL HIGH (ref 70–99)
Glucose-Capillary: 99 mg/dL (ref 70–99)

## 2024-07-29 LAB — BASIC METABOLIC PANEL WITH GFR
Anion gap: 8 (ref 5–15)
BUN: 38 mg/dL — ABNORMAL HIGH (ref 8–23)
CO2: 23 mmol/L (ref 22–32)
Calcium: 8.7 mg/dL — ABNORMAL LOW (ref 8.9–10.3)
Chloride: 105 mmol/L (ref 98–111)
Creatinine, Ser: 1.31 mg/dL — ABNORMAL HIGH (ref 0.61–1.24)
GFR, Estimated: 54 mL/min — ABNORMAL LOW (ref >60)
Glucose, Bld: 92 mg/dL (ref 70–99)
Potassium: 4.5 mmol/L (ref 3.5–5.1)
Sodium: 136 mmol/L (ref 135–145)

## 2024-07-29 LAB — CBC
HCT: 32.6 % — ABNORMAL LOW (ref 39.0–52.0)
Hemoglobin: 10.4 g/dL — ABNORMAL LOW (ref 13.0–17.0)
MCH: 28.6 pg (ref 26.0–34.0)
MCHC: 31.9 g/dL (ref 30.0–36.0)
MCV: 89.6 fL (ref 80.0–100.0)
Platelets: 151 K/uL (ref 150–400)
RBC: 3.64 MIL/uL — ABNORMAL LOW (ref 4.22–5.81)
RDW: 14.8 % (ref 11.5–15.5)
WBC: 8.3 K/uL (ref 4.0–10.5)
nRBC: 0 % (ref 0.0–0.2)

## 2024-07-29 LAB — MAGNESIUM: Magnesium: 2.1 mg/dL (ref 1.7–2.4)

## 2024-07-29 MED ORDER — POTASSIUM CHLORIDE CRYS ER 20 MEQ PO TBCR
20.0000 meq | EXTENDED_RELEASE_TABLET | Freq: Once | ORAL | Status: AC
  Administered 2024-07-29: 20 meq via ORAL
  Filled 2024-07-29: qty 1

## 2024-07-29 MED ORDER — FUROSEMIDE 10 MG/ML IJ SOLN
40.0000 mg | Freq: Once | INTRAMUSCULAR | Status: AC
  Administered 2024-07-29: 40 mg via INTRAVENOUS
  Filled 2024-07-29: qty 4

## 2024-07-29 MED FILL — Midazolam HCl Inj 2 MG/2ML (Base Equivalent): INTRAMUSCULAR | Qty: 1 | Status: AC

## 2024-07-29 NOTE — Evaluation (Signed)
 Physical Therapy Evaluation and Discharge Patient Details Name: [name] MRN: [id number] DOB: 1941/08/28 Today's Date: 07/29/2024  History of Present Illness  Pt is a 83 y.o male admitted for planned TAVR 10/14. S/p dual-chamber pacemaker on 07/28/24. PMH: HTN, HLD, DM, CKD stage IIIb, RBBB, 1st degree AV block, hypothyroidism, sleep apnea, bipolar disorder, severe biventricular heart failure and severe low flow/low gradient aortic stenosis.   Clinical Impression  Patient evaluated by Physical Therapy with no further acute PT needs identified. All education has been completed and the patient has no further questions. Requires cues for pacemaker precautions, showing some reduced safety awareness. Pt did not require physical assist with activities, able to transfer and ambulate up to 550 feet with supervision and RW for support. VSS throughout with SpO2 98% on RA, HR in 80s with exertion. Handout provided regarding precautions. States he has 24/7 supervision/assist available between wife, daughter, and son. Wants to purchase his own RW which I suggested he use for a while. All questions answered. Adequate for d/c from mobility standpoint. See below for any follow-up Physical Therapy or equipment needs. PT is signing off. Thank you for this referral.         If plan is discharge home, recommend the following: A little help with bathing/dressing/bathroom;Assistance with cooking/housework;Assist for transportation;Help with stairs or ramp for entrance   Can travel by private vehicle        Equipment Recommendations None recommended by PT  Recommendations for Other Services       Functional Status Assessment Patient has had a recent decline in their functional status and demonstrates the ability to make significant improvements in function in a reasonable and predictable amount of time.     Precautions / Restrictions Precautions Precautions: Fall;ICD/Pacemaker Recall of  Precautions/Restrictions: Impaired Restrictions Weight Bearing Restrictions Per Provider Order: No      Mobility  Bed Mobility Overal bed mobility: Needs Assistance Bed Mobility: Supine to Sit, Sit to Supine     Supine to sit: Supervision Sit to supine: Supervision   General bed mobility comments: Cues for precautions, to avoid use of LUE.    Transfers Overall transfer level: Needs assistance Equipment used: Rolling walker (2 wheels) Transfers: Sit to/from Stand Sit to Stand: Supervision           General transfer comment: Steady upon rising. Difficulty extending back to neutral, states baseline. RW for support.    Ambulation/Gait Ambulation/Gait assistance: Supervision Gait Distance (Feet): 550 Feet Assistive device: Rolling walker (2 wheels) Gait Pattern/deviations: Step-through pattern, Decreased stride length, Trunk flexed Gait velocity: functional Gait velocity interpretation: >4.37 ft/sec, indicative of normal walking speed   General Gait Details: Intermittent cues for upright posture as tolerated and for proximity to AD. Light use of RW, no overt buckling or LOB noted with this device. VSS. Supervision for safety. Good pace. HR 80s  Stairs         General stair comments: Declines (States family can assist.)  Wheelchair Mobility     Tilt Bed    Modified Rankin (Stroke Patients Only)       Balance Overall balance assessment: Needs assistance Sitting-balance support: No upper extremity supported, Feet supported Sitting balance-Leahy Scale: Good     Standing balance support: No upper extremity supported Standing balance-Leahy Scale: Fair Standing balance comment: more stable with AD in standing  Pertinent Vitals/Pain Pain Assessment Pain Assessment: No/denies pain    Home Living Family/patient expects to be discharged to:: Private residence Living Arrangements: Spouse/significant other (Dtr stays  during week, son lives nearby. per pt.) Available Help at Discharge: Family;Available 24 hours/day Type of Home: House Home Access: Stairs to enter Entrance Stairs-Rails: None (Son is in process of building rails) Secretary/administrator of Steps: 4   Home Layout: One level Home Equipment: Shower seat;Grab bars - tub/shower;Toilet riser;Rollator (4 wheels) Additional Comments: Wife is on oxygen but requires assist    Prior Function Prior Level of Function : Independent/Modified Independent;Driving;History of Falls (last six months)             Mobility Comments: no AD reports he has fallen due to low sodium. ADLs Comments: Ind, assists wife     Extremity/Trunk Assessment   Upper Extremity Assessment Upper Extremity Assessment: Defer to OT evaluation    Lower Extremity Assessment Lower Extremity Assessment: Generalized weakness    Cervical / Trunk Assessment Cervical / Trunk Assessment: Kyphotic  Communication   Communication Communication: No apparent difficulties    Cognition Arousal: Alert Behavior During Therapy: WFL for tasks assessed/performed   PT - Cognitive impairments: Safety/Judgement                       PT - Cognition Comments: pt with brisk walking pace, mildly impulsive but responds quickly to redirection from PT - consistent with prior visit. Following commands: Intact       Cueing Cueing Techniques: Verbal cues     General Comments General comments (skin integrity, edema, etc.): Extensive review of precautions post-op. Handout provided following PPM.    Exercises     Assessment/Plan    PT Assessment Patient does not need any further PT services  PT Problem List Decreased strength;Decreased activity tolerance;Decreased balance;Decreased mobility;Decreased range of motion;Decreased knowledge of use of DME;Decreased safety awareness;Decreased knowledge of precautions       PT Treatment Interventions DME instruction;Gait  training;Stair training;Functional mobility training;Therapeutic activities;Therapeutic exercise;Balance training;Neuromuscular re-education;Patient/family education;Modalities    PT Goals (Current goals can be found in the Care Plan section)  Acute Rehab PT Goals Patient Stated Goal: go home PT Goal Formulation: All assessment and education complete, DC therapy    Frequency       Co-evaluation               AM-PAC PT 6 Clicks Mobility  Outcome Measure Help needed turning from your back to your side while in a flat bed without using bedrails?: None Help needed moving from lying on your back to sitting on the side of a flat bed without using bedrails?: A Little Help needed moving to and from a bed to a chair (including a wheelchair)?: A Little Help needed standing up from a chair using your arms (e.g., wheelchair or bedside chair)?: A Little Help needed to walk in hospital room?: A Little Help needed climbing 3-5 steps with a railing? : A Little 6 Click Score: 19    End of Session Equipment Utilized During Treatment: Gait belt Activity Tolerance: Patient tolerated treatment well Patient left: in bed;with call bell/phone within reach;with bed alarm set Nurse Communication: Mobility status PT Visit Diagnosis: Other abnormalities of gait and mobility (R26.89);Unsteadiness on feet (R26.81);Muscle weakness (generalized) (M62.81);Difficulty in walking, not elsewhere classified (R26.2);History of falling (Z91.81)    Time: 8785-8756 PT Time Calculation (min) (ACUTE ONLY): 29 min   Charges:   PT Evaluation $PT Eval Low Complexity:  1 Low PT Treatments $Therapeutic Activity: 8-22 mins PT General Charges $$ ACUTE PT VISIT: 1 Visit         Leontine Roads, PT, DPT [facility]  Rehabilitation Services Physical Therapist Office: [phone] Website: [email or web address]   Leontine GORMAN Roads 07/29/2024, 1:36 PM

## 2024-07-29 NOTE — Progress Notes (Addendum)
 Patient Name: [name] Date of Encounter: 07/29/2024  Primary Cardiologist: Alm Clay, MD Electrophysiologist: None  Interval Summary   The patient is doing well today.  At this time, the patient denies chest pain, shortness of breath, or any new concerns.  Vital Signs    Vitals:   07/29/24 0400 07/29/24 0500 07/29/24 0600 07/29/24 0700  BP: (!) 141/76 123/81 (!) 118/93 (!) 144/79  Pulse: 68 60 66 63  Resp: (!) 22 (!) 28 19 20   Temp:      TempSrc:      SpO2: 91% 95% 94% 94%  Weight:  69.8 kg    Height:        Intake/Output Summary (Last 24 hours) at 07/29/2024 0730 Last data filed at 07/29/2024 0600 Gross per 24 hour  Intake 806 ml  Output 525 ml  Net 281 ml   Filed Weights   07/27/24 0500 07/28/24 0557 07/29/24 0500  Weight: 71.4 kg 69.8 kg 69.8 kg    Physical Exam    GEN- pleasant, in NAD, Alert and oriented  Lungs- Clear to ausculation bilaterally, normal work of breathing Cardiac- Regular rate and rhythm, no murmurs, rubs or gallops. PPM site wnl, no significant edema GI- soft, NT, ND, + BS Extremities- no clubbing or cyanosis. No edema  Telemetry    VP 60's with occ PVC's, couplets (personally reviewed)  Device:  Abbott Dual Chamber PPM implanted 07/28/24 with LBA pacing  Hospital Course    [name] is a 83 y.o. male with a history of RBBB, first-degree AV block, HTN, HLD, DM, CKD, hypothyroidism, OSA, bipolar disorder admitted 07/26/24 with volume overload in the setting of severe biventricular heart failure and severe low-flow/low gradient aortic stenosis.  Advanced heart failure team was consulted and the patient was diuresed and started on inotropic support.  Echo demonstrated LVEF of 30 to 35% with global hypokinesis, severe hypokinesis of the mid apical anterior and anteroseptal walls.  AS mean gradient 33 mmHg.  He underwent left heart cath which showed 100% occlusion of circumflex with filling from right to left collaterals and severe mid  LAD stenosis.  Patient was evaluated by the structural heart team and felt not to be a good candidate for conventional aortic valve replacement by surgical approach.  On 07/26/2024 he underwent TAVR with Dr. Shyrl and Dr. Verlin.  A 26 mm Edwards SAPIEN 3 THV valve was placed.  Patient had pre-existing RBBB and first-degree AV block prior to surgery.  In the postoperative phase he was noted to have complete heart block requiring TVP.    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  Agree with above, note added to reflect my findings.  On exam, regular rhythm, device site clean dry and intact.  She is now status post pacemaker implant for complete heart block post TAVR.  Device functioning appropriately.  Sensing, threshold, impedance within normal limits as expected post implant.  Chest x-ray and interrogation without issue.  Okay for discharge today with follow-up in device clinic.   Fran Neiswonger M. Danell Vazquez MD 07/29/2024 10:36 AM

## 2024-07-29 NOTE — TOC Transition Note (Signed)
 Transition of Care [facility]) - Discharge Note   Patient Details  Name: [name] MRN: [id number] Date of Birth: 1941-01-29  [facility] CM/SW Contact:  Justina Delcia Czar, RN Phone Number: [phone] 07/29/2024, 11:44 AM   Clinical Narrative:    Spoke to pt and gave permission to speak to son. Contacted son and states pt has RW at home. Son in the home to assist pt and take to appts.   Contacted Dr Rosalene office and left message with his medical assistance for return call for hospital follow up appt. Waiting call back. Son prefers in the am appts.   Medicare.gov list with rating placed on chart and given to pt to arrange [facility]. Waiting PT/OT evaluation and recommendations. Son agreeable to [facility] for [facility]. Will notify rep, Burnard with new referral.    Final next level of care: Home w Home Health Services Barriers to Discharge: No Barriers Identified   Patient Goals and CMS Choice Patient states their goals for this hospitalization and ongoing recovery are:: wants to go home CMS Medicare.gov Compare Post Acute Care list provided to:: Patient Represenative (must comment) Choice offered to / list presented to : Adult Children      Discharge Placement                       Discharge Plan and Services Additional resources added to the After Visit Summary for     Discharge Planning Services: CM Consult Post Acute Care Choice: Home Health                    HH Arranged: RN, PT [facility] Agency: [facility]        Social Drivers of Health (SDOH) Interventions SDOH Screenings   Food Insecurity: Food Insecurity Present (07/27/2024)  Housing: Low Risk  (07/27/2024)  Transportation Needs: No Transportation Needs (07/27/2024)  Utilities: Not At Risk (07/27/2024)  Social Connections: Moderately Integrated (07/27/2024)  Tobacco Use: Medium Risk (07/26/2024)     Readmission Risk Interventions     No data to display

## 2024-07-29 NOTE — Progress Notes (Signed)
 CARDIAC REHAB PHASE I   Post TAVR education including site care, restrictions, risk factors, heart healthy diabetic diet, exercise guidelines, smoking cessation and CRP2 reviewed. Will refer to [facility] rehabilitation. All questions and concerns addressed. [id number]  Isaiah JAYSON Liverpool, RN BSN 07/29/2024 9:41 AM

## 2024-08-01 ENCOUNTER — Telehealth: Payer: Self-pay

## 2024-08-01 NOTE — Telephone Encounter (Signed)
 Patient contacted regarding discharge from [facility] on 07/29/24  Patient understands to follow up with HF Clinic  on 08/03/24 at 2:00PM at [address] Location. Patient understands discharge instructions? Yes Patient understands medications and regiment? Yes Patient understands to bring all medications to this visit? Yes

## 2024-08-02 ENCOUNTER — Telehealth (HOSPITAL_COMMUNITY): Payer: Self-pay

## 2024-08-02 NOTE — Telephone Encounter (Signed)
 Phase II referral. Attempted to call patient regarding interest in cardiac rehab- no answer, unable to leave message due to call screening. Sent MyChart message.  F/u appts on 10/22, 10/30 & 11/24.

## 2024-08-02 NOTE — Progress Notes (Signed)
 [facility]ED [facility] CLINIC CONSULT NOTE  Referring Physician: Nichole Senior, MD Primary Care: Nichole Senior, MD Primary Cardiologist: Alm Clay, MD AHF Cardiologist: Dr. Rolan  Chief Complaint: Chronic Biventricular Systolic Heart Failure  HPI: [name] is a 83 y.o. male w/ HTN, HLD, Type 2 DM, CKD IIIa, hypothyroidism, OSA and bioplar disorder but no prior cardiac history.   He presented w/ new HF symptoms and marked volume overload. Echo demonstrated biventricular failure, LVEF 30-35%, RV mod reduced, global hypokinesis w/ disproportionately severe hypokinesis/akinesis in the mid-apical left anterior and anteroseptal walls and severe AS, AVA 0.72 cm, mean gradient 33 mmHg. Interventricular septum flattened in systole, consistent with right ventricular pressure overload. Cardiology consulted. Being considered for TAVR. SCr rose w/ diuresis and low BP limiting GDMT. Concern for low output HF. AHF team consulted. Co-ox intitally stable, not started on inotropes and diuresed improved with higher diuretic doses. L/RHC with occluded LCx with R>L collaterals, severe LAD disease, mildly elevated filling pressures, normal fick CI (RA 7, PA 69/23 (38), PAPi 6.6, Fick CO/CI 4.67/2.56)). Plan transitioned to TAVR + PCI. Optimized from AHF standpoint. Discharged with plan for scheduled TAVR 10/14.   Underwent TAVR 10/14 by Dr. Shyrl. Patient came out with TVP. Paced. 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 EP consulted. Echo 07/27/24 EF 25-30%, LV with RWMA, GIIDD, RV normal, LA mod dilated, RA mildly dilated, no MR, AV repaired/replaced, normal structure/function of AV. Underwent PPM 07/28/24.  Staged PCI pending dental extractions.    Today he returns for post hospital follow up. Overall feeling good. Denies palpitations, CP, dizziness, edema, or PND/Orthopnea. No SOB. Appetite ok. No fever or chills. Does not weight at home. Has not been taking any medications since discharged on the 17th. Movement  limited by scoliosis. Continuously repeats feeling drunk because of his sleep apnea because he wasn't sleeping well. His son brought him in to clinic.     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 Verlin and Dr. Shyrl   Sleep apnea     Current Outpatient Medications  Medication Sig Dispense Refill   aspirin 81 MG chewable tablet Chew 1 tablet (81 mg total) by mouth daily. 30 tablet 0   atorvastatin (LIPITOR) 80 MG tablet Take 1 tablet (80 mg total) by mouth daily. 30 tablet 1   carboxymethylcellulose (REFRESH PLUS) 0.5 % SOLN Place 1 drop into both eyes daily as needed (dry eyes).     dapagliflozin propanediol (FARXIGA) 10 MG TABS tablet Take 1 tablet (10 mg total) by mouth daily. 30 tablet 1   docusate sodium  (COLACE) 100 MG capsule Take 200 mg by mouth 2 (two) times daily as needed for mild constipation.     fluticasone (FLONASE) 50 MCG/ACT nasal spray Place 2 sprays into both nostrils daily as needed for allergies.     furosemide (LASIX) 40 MG tablet Take 1 tablet (40 mg total) by mouth daily. 30 tablet 1   levothyroxine (SYNTHROID) 75 MCG tablet Take 75 mcg by mouth daily before breakfast.     polyethylene glycol (MIRALAX  / GLYCOLAX ) 17 g packet Take 17 g by mouth 2 (two) times daily as needed for mild constipation.     potassium chloride SA (KLOR-CON M)  20 MEQ tablet Take 1 tablet (20 mEq total) by mouth daily. 30 tablet 1   No current facility-administered medications for this visit.    No Known Allergies    Social History   Socioeconomic History   Marital status: Married    Spouse name: Not on file   Number of children: Not on file   Years of education: Not on file   Highest education level: Not on file   Occupational History   Not on file  Tobacco Use   Smoking status: Former    Types: Cigarettes   Smokeless tobacco: Never  Substance and Sexual Activity   Alcohol use: Yes   Drug use: Never   Sexual activity: Not on file  Other Topics Concern   Not on file  Social History Narrative   Not on file   Social Drivers of Health   Financial Resource Strain: Not on file  Food Insecurity: Food Insecurity Present (07/27/2024)   Hunger Vital Sign    Worried About Running Out of Food in the Last Year: Sometimes true    Ran Out of Food in the Last Year: Never true  Transportation Needs: No Transportation Needs (07/27/2024)   PRAPARE - Administrator, Civil Service (Medical): No    Lack of Transportation (Non-Medical): No  Physical Activity: Not on file  Stress: Not on file  Social Connections: Moderately Integrated (07/27/2024)   Social Connection and Isolation Panel    Frequency of Communication with Friends and Family: More than three times a week    Frequency of Social Gatherings with Friends and Family: More than three times a week    Attends Religious Services: Never    Database administrator or Organizations: Yes    Attends Banker Meetings: 1 to 4 times per year    Marital Status: Married  Catering manager Violence: Not At Risk (07/27/2024)   Humiliation, Afraid, Rape, and Kick questionnaire    Fear of Current or Ex-Partner: No    Emotionally Abused: No    Physically Abused: No    Sexually Abused: No      Family History  Problem Relation Age of Onset   CAD Father    Diabetes Mellitus II Maternal Grandmother     There were no vitals filed for this visit.  PHYSICAL EXAM: General:  elderly appearing.  No respiratory difficulty Neck: JVD ~6 cm.  Cor: Regular rate & rhythm. No murmurs. Lungs: clear Extremities: trace BLE edema  Neuro: alert & oriented x 3. Affect pleasant.   Wt Readings from Last 3 Encounters:  08/03/24 68.8 kg (151 lb 9.6  oz)  07/29/24 69.8 kg (153 lb 14.1 oz)  07/21/24 69.2 kg (152 lb 8.9 oz)     ASSESSMENT & PLAN: Chronic Biventricular Systolic Heart Failure - recent diagnosis - Echo 07/13/24 LVEF 30-35%, RV mod reduced, GHK w/ disproportionately severe hypokinesis/akinesis in the mid-apical L anterior and anteroseptal walls and severe AS, AVA 0.72 cm, mean gradient 33 mmHg. Interventricular septum flattened in systole, consistent with RV pressure overload, mod RV dysfunction with mild RV enlargement. 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  - Post operative echo showed EF 25-30%, mild RVE, normally functioning TAVR with a mean gradient of 3 mmHg and no PVL    CAD - LHC w/ severe LAD disease and occluded LCx w/ R>>L collaterals - Plan for staged PCI post dental extractions.  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 Unable to interrogate yet.   Able to answer most questions during visit but HOH and constantly kept bringing up that he feels drunk all the time 2/2 sleep apnea. He lives with his wife. His son tries to help take care of them, lives relatively close. Offered paramedicine program to him today, open to service. I believe he would benefit significantly from them.   Follow up in 2 weeks with APP. Will need labs at that time.   [facility] [address] [facility] [place] [address] [phone] (office) [phone] (fax)   Beckey LITTIE Coe AGACNP-BC

## 2024-08-02 NOTE — Telephone Encounter (Signed)
 Called to confirm/remind patient of their appointment at the [facility] on 08/03/24 2:00.   Appointment:   [] Confirmed  [x] Left mess   [] No answer/No voice mail  [] VM Full/unable to leave message  [] Phone not in service  Patient reminded to bring all medications and/or complete list.  Confirmed patient has transportation. Gave directions, instructed to utilize valet parking.

## 2024-08-03 ENCOUNTER — Ambulatory Visit (HOSPITAL_COMMUNITY)
Admit: 2024-08-03 | Discharge: 2024-08-03 | Disposition: A | Discharge status: Home / Self Care | Source: Ambulatory Visit | Attending: Internal Medicine | Admitting: Internal Medicine

## 2024-08-03 ENCOUNTER — Encounter (HOSPITAL_COMMUNITY): Payer: Self-pay

## 2024-08-03 ENCOUNTER — Ambulatory Visit (HOSPITAL_COMMUNITY): Payer: Self-pay | Admitting: Internal Medicine

## 2024-08-03 VITALS — BP 138/76 | HR 63 | Wt 151.6 lb

## 2024-08-03 DIAGNOSIS — N1831 Chronic kidney disease, stage 3a: Secondary | ICD-10-CM | POA: Insufficient documentation

## 2024-08-03 DIAGNOSIS — E785 Hyperlipidemia, unspecified: Secondary | ICD-10-CM | POA: Diagnosis not present

## 2024-08-03 DIAGNOSIS — Z7984 Long term (current) use of oral hypoglycemic drugs: Secondary | ICD-10-CM | POA: Diagnosis not present

## 2024-08-03 DIAGNOSIS — Z950 Presence of cardiac pacemaker: Secondary | ICD-10-CM | POA: Insufficient documentation

## 2024-08-03 DIAGNOSIS — I5082 Biventricular heart failure: Secondary | ICD-10-CM | POA: Insufficient documentation

## 2024-08-03 DIAGNOSIS — I5022 Chronic systolic (congestive) heart failure: Secondary | ICD-10-CM | POA: Insufficient documentation

## 2024-08-03 DIAGNOSIS — Z952 Presence of prosthetic heart valve: Secondary | ICD-10-CM

## 2024-08-03 DIAGNOSIS — E039 Hypothyroidism, unspecified: Secondary | ICD-10-CM | POA: Insufficient documentation

## 2024-08-03 DIAGNOSIS — G4733 Obstructive sleep apnea (adult) (pediatric): Secondary | ICD-10-CM | POA: Insufficient documentation

## 2024-08-03 DIAGNOSIS — Z955 Presence of coronary angioplasty implant and graft: Secondary | ICD-10-CM | POA: Insufficient documentation

## 2024-08-03 DIAGNOSIS — Z79890 Hormone replacement therapy (postmenopausal): Secondary | ICD-10-CM | POA: Insufficient documentation

## 2024-08-03 DIAGNOSIS — I442 Atrioventricular block, complete: Secondary | ICD-10-CM | POA: Diagnosis not present

## 2024-08-03 DIAGNOSIS — I130 Hypertensive heart and chronic kidney disease with heart failure and stage 1 through stage 4 chronic kidney disease, or unspecified chronic kidney disease: Secondary | ICD-10-CM | POA: Diagnosis not present

## 2024-08-03 DIAGNOSIS — E1122 Type 2 diabetes mellitus with diabetic chronic kidney disease: Secondary | ICD-10-CM | POA: Insufficient documentation

## 2024-08-03 DIAGNOSIS — F319 Bipolar disorder, unspecified: Secondary | ICD-10-CM | POA: Diagnosis not present

## 2024-08-03 DIAGNOSIS — I2510 Atherosclerotic heart disease of native coronary artery without angina pectoris: Secondary | ICD-10-CM | POA: Diagnosis not present

## 2024-08-03 LAB — BASIC METABOLIC PANEL WITH GFR
Anion gap: 6 (ref 5–15)
BUN: 20 mg/dL (ref 8–23)
CO2: 24 mmol/L (ref 22–32)
Calcium: 8.9 mg/dL (ref 8.9–10.3)
Chloride: 105 mmol/L (ref 98–111)
Creatinine, Ser: 1.13 mg/dL (ref 0.61–1.24)
GFR, Estimated: >60 mL/min (ref >60)
Glucose, Bld: 104 mg/dL — ABNORMAL HIGH (ref 70–99)
Potassium: 4.5 mmol/L (ref 3.5–5.1)
Sodium: 135 mmol/L (ref 135–145)

## 2024-08-03 LAB — BRAIN NATRIURETIC PEPTIDE: B Natriuretic Peptide: 2986.2 pg/mL — ABNORMAL HIGH (ref 0.0–100.0)

## 2024-08-03 MED ORDER — FUROSEMIDE 20 MG PO TABS: 20.0000 mg | ORAL_TABLET | Freq: Every day | ORAL | 3 refills | Status: DC

## 2024-08-03 MED ORDER — LEVOTHYROXINE SODIUM 75 MCG PO TABS: 75.0000 ug | ORAL_TABLET | Freq: Every day | ORAL | 1 refills | Status: AC

## 2024-08-03 MED ORDER — ASPIRIN 81 MG PO CHEW: 81.0000 mg | CHEWABLE_TABLET | Freq: Every day | ORAL | 0 refills | Status: AC

## 2024-08-03 MED ORDER — DAPAGLIFLOZIN PROPANEDIOL 10 MG PO TABS: 10.0000 mg | ORAL_TABLET | Freq: Every day | ORAL | 1 refills | Status: DC

## 2024-08-03 MED ORDER — ATORVASTATIN CALCIUM 80 MG PO TABS: 80.0000 mg | ORAL_TABLET | Freq: Every day | ORAL | 1 refills | Status: DC

## 2024-08-03 NOTE — Patient Instructions (Signed)
 Medication Changes:  RESTART ASPIRIN 81MG  ONCE DAILY   RESTART FARXIGA 10MG  ONCE DAILY   RESTART FUROSEMIDE 20MG  ONCE DAILY   RESTART SYNTHROID 75MCG ONCE DAILY   RESTART ATORVASTATIN 80MG  ONCE DAILY   STOP POTASSIUM SUPPLEMENT   Lab Work:  Labs done today, your results will be available in MyChart, we will contact you for abnormal readings.  AND THEN LABS AGAIN IN 1 WEEK AS SCHEDULED   Referrals:  YOU HAVE BEEN REFERRED TO PARAMEDICINE THEY WILL REACH OUT TO YOU OR CALL TO ARRANGE THIS. PLEASE CALL US  WITH ANY CONCERNS   Follow-Up in: 2 WEEKS AS SCHEDULED WITH APP CLINIC   At the [facility], you and your health needs are our priority. We have a designated team specialized in the treatment of Heart Failure. This Care Team includes your primary Heart Failure Specialized Cardiologist (physician), Advanced Practice Providers (APPs- Physician Assistants and Nurse Practitioners), and Pharmacist who all work together to provide you with the care you need, when you need it.   You may see any of the following providers on your designated Care Team at your next follow up:  Dr. Toribio Fuel Dr. [name] Dr. Ria Commander Dr. Odis Brownie Greig Mosses, NP Caffie Shed, [place] [facility] [place], [place] Beckey Coe, NP [place] Lee, NP Tinnie Redman, PharmD   Please be sure to bring in all your medications bottles to every appointment.   Need to Contact Us :  If you have any questions or concerns before your next appointment please send us  a message through [place] or call our office at [phone].    TO LEAVE A MESSAGE FOR THE NURSE SELECT OPTION 2, PLEASE LEAVE A MESSAGE INCLUDING: YOUR NAME DATE OF BIRTH CALL BACK NUMBER REASON FOR CALL**this is important as we prioritize the call backs  YOU WILL RECEIVE A CALL BACK THE SAME DAY AS LONG AS YOU CALL BEFORE 4:00 PM

## 2024-08-04 ENCOUNTER — Emergency Department (HOSPITAL_COMMUNITY)
Admission: EM | Admit: 2024-08-04 | Discharge: 2024-08-05 | Disposition: A | Discharge status: Other Acute Inpatient Hospital | Attending: Student in an Organized Health Care Education/Training Program | Admitting: Student in an Organized Health Care Education/Training Program

## 2024-08-04 ENCOUNTER — Encounter (HOSPITAL_COMMUNITY): Payer: Self-pay | Admitting: Psychiatry

## 2024-08-04 ENCOUNTER — Emergency Department (HOSPITAL_COMMUNITY)

## 2024-08-04 DIAGNOSIS — I130 Hypertensive heart and chronic kidney disease with heart failure and stage 1 through stage 4 chronic kidney disease, or unspecified chronic kidney disease: Secondary | ICD-10-CM | POA: Diagnosis not present

## 2024-08-04 DIAGNOSIS — R4182 Altered mental status, unspecified: Secondary | ICD-10-CM | POA: Insufficient documentation

## 2024-08-04 DIAGNOSIS — E039 Hypothyroidism, unspecified: Secondary | ICD-10-CM | POA: Diagnosis not present

## 2024-08-04 DIAGNOSIS — Z950 Presence of cardiac pacemaker: Secondary | ICD-10-CM | POA: Insufficient documentation

## 2024-08-04 DIAGNOSIS — I10 Essential (primary) hypertension: Secondary | ICD-10-CM | POA: Insufficient documentation

## 2024-08-04 DIAGNOSIS — R404 Transient alteration of awareness: Secondary | ICD-10-CM | POA: Diagnosis not present

## 2024-08-04 DIAGNOSIS — E1122 Type 2 diabetes mellitus with diabetic chronic kidney disease: Secondary | ICD-10-CM | POA: Insufficient documentation

## 2024-08-04 DIAGNOSIS — F419 Anxiety disorder, unspecified: Secondary | ICD-10-CM | POA: Diagnosis not present

## 2024-08-04 DIAGNOSIS — G4730 Sleep apnea, unspecified: Secondary | ICD-10-CM | POA: Diagnosis not present

## 2024-08-04 DIAGNOSIS — F22 Delusional disorders: Secondary | ICD-10-CM | POA: Insufficient documentation

## 2024-08-04 DIAGNOSIS — F29 Unspecified psychosis not due to a substance or known physiological condition: Secondary | ICD-10-CM | POA: Insufficient documentation

## 2024-08-04 DIAGNOSIS — Z7982 Long term (current) use of aspirin: Secondary | ICD-10-CM | POA: Diagnosis not present

## 2024-08-04 DIAGNOSIS — I5082 Biventricular heart failure: Secondary | ICD-10-CM | POA: Insufficient documentation

## 2024-08-04 DIAGNOSIS — N1831 Chronic kidney disease, stage 3a: Secondary | ICD-10-CM | POA: Diagnosis not present

## 2024-08-04 DIAGNOSIS — E119 Type 2 diabetes mellitus without complications: Secondary | ICD-10-CM | POA: Insufficient documentation

## 2024-08-04 LAB — COMPREHENSIVE METABOLIC PANEL WITH GFR
ALT: 19 U/L (ref 0–44)
AST: 33 U/L (ref 15–41)
Albumin: 3.9 g/dL (ref 3.5–5.0)
Alkaline Phosphatase: 87 U/L (ref 38–126)
Anion gap: 9 (ref 5–15)
BUN: 19 mg/dL (ref 8–23)
CO2: 25 mmol/L (ref 22–32)
Calcium: 9.9 mg/dL (ref 8.9–10.3)
Chloride: 105 mmol/L (ref 98–111)
Creatinine, Ser: 1.13 mg/dL (ref 0.61–1.24)
GFR, Estimated: >60 mL/min (ref >60)
Glucose, Bld: 101 mg/dL — ABNORMAL HIGH (ref 70–99)
Potassium: 4.6 mmol/L (ref 3.5–5.1)
Sodium: 139 mmol/L (ref 135–145)
Total Bilirubin: 1.3 mg/dL — ABNORMAL HIGH (ref 0.0–1.2)
Total Protein: 7.5 g/dL (ref 6.5–8.1)

## 2024-08-04 LAB — URINALYSIS, ROUTINE W REFLEX MICROSCOPIC
Bacteria, UA: NONE SEEN
Bilirubin Urine: NEGATIVE
Glucose, UA: NEGATIVE mg/dL
Hgb urine dipstick: NEGATIVE
Ketones, ur: NEGATIVE mg/dL
Leukocytes,Ua: NEGATIVE
Nitrite: NEGATIVE
Protein, ur: 100 mg/dL — AB
Specific Gravity, Urine: 1.019 (ref 1.005–1.030)
pH: 7 (ref 5.0–8.0)

## 2024-08-04 LAB — CBC WITH DIFFERENTIAL/PLATELET
Abs Immature Granulocytes: 0.02 K/uL (ref 0.00–0.07)
Basophils Absolute: 0.1 K/uL (ref 0.0–0.1)
Basophils Relative: 1 %
Eosinophils Absolute: 0.2 K/uL (ref 0.0–0.5)
Eosinophils Relative: 3 %
HCT: 36.9 % — ABNORMAL LOW (ref 39.0–52.0)
Hemoglobin: 11.7 g/dL — ABNORMAL LOW (ref 13.0–17.0)
Immature Granulocytes: 0 %
Lymphocytes Relative: 16 %
Lymphs Abs: 1.2 K/uL (ref 0.7–4.0)
MCH: 28.3 pg (ref 26.0–34.0)
MCHC: 31.7 g/dL (ref 30.0–36.0)
MCV: 89.3 fL (ref 80.0–100.0)
Monocytes Absolute: 0.4 K/uL (ref 0.1–1.0)
Monocytes Relative: 6 %
Neutro Abs: 5.4 K/uL (ref 1.7–7.7)
Neutrophils Relative %: 74 %
Platelets: 182 K/uL (ref 150–400)
RBC: 4.13 MIL/uL — ABNORMAL LOW (ref 4.22–5.81)
RDW: 14.6 % (ref 11.5–15.5)
WBC: 7.2 K/uL (ref 4.0–10.5)
nRBC: 0 % (ref 0.0–0.2)

## 2024-08-04 LAB — RESP PANEL BY RT-PCR (RSV, FLU A&B, COVID)  RVPGX2
Influenza A by PCR: NEGATIVE
Influenza B by PCR: NEGATIVE
Resp Syncytial Virus by PCR: NEGATIVE
SARS Coronavirus 2 by RT PCR: NEGATIVE

## 2024-08-04 LAB — ETHANOL: Alcohol, Ethyl (B): <15 mg/dL (ref <15)

## 2024-08-04 LAB — SALICYLATE LEVEL: Salicylate Lvl: <7 mg/dL — ABNORMAL LOW (ref 7.0–30.0)

## 2024-08-04 LAB — ACETAMINOPHEN LEVEL: Acetaminophen (Tylenol), Serum: <10 ug/mL — ABNORMAL LOW (ref 10–30)

## 2024-08-04 MED ORDER — FUROSEMIDE 20 MG PO TABS: 20.0000 mg | ORAL_TABLET | Freq: Every day | ORAL | Status: DC

## 2024-08-04 MED ORDER — ASPIRIN 81 MG PO CHEW: 81.0000 mg | CHEWABLE_TABLET | Freq: Every day | ORAL | Status: DC

## 2024-08-04 MED ORDER — OLANZAPINE 10 MG IM SOLR
5.0000 mg | Freq: Once | INTRAMUSCULAR | Status: AC | PRN
  Administered 2024-08-04: 5 mg via INTRAMUSCULAR
  Filled 2024-08-04: qty 10

## 2024-08-04 MED ORDER — RISPERIDONE 0.25 MG PO TABS
0.2500 mg | ORAL_TABLET | Freq: Two times a day (BID) | ORAL | Status: DC
  Filled 2024-08-04 (×3): qty 1

## 2024-08-04 MED ORDER — LEVOTHYROXINE SODIUM 75 MCG PO TABS: 75.0000 ug | ORAL_TABLET | Freq: Every day | ORAL | Status: DC

## 2024-08-04 MED ORDER — DAPAGLIFLOZIN PROPANEDIOL 10 MG PO TABS
10.0000 mg | ORAL_TABLET | Freq: Every day | ORAL | Status: DC
  Filled 2024-08-04: qty 1

## 2024-08-04 MED ORDER — ATORVASTATIN CALCIUM 80 MG PO TABS: 80.0000 mg | ORAL_TABLET | Freq: Every day | ORAL | Status: DC

## 2024-08-04 NOTE — ED Notes (Signed)
 FINDINGS AND CUSTODY PAPERWORK IS NOW LOADED INTO THE CHART IVC PAPERWORK TOTALLY COMPLETED COPIES ORIGINAL IN RED FOLDER ATTACHED TO THE CLIPBOARD IN PURPLE ZONE 49

## 2024-08-04 NOTE — ED Notes (Signed)
 Pt was informed he has PM meds due at 1900, pt stated I dont want anything from you, get out of my room, I want to go home. Pt refused to take any PM meds.

## 2024-08-04 NOTE — Progress Notes (Signed)
 Pt was accepted to [facility] on 08/04/2024  Bed Assignment: L31   Address: [address] [place], [place], [place] [address]  -[facility] Fax: [phone]  Pt meets inpatient criteria per Jerel Gravely, NP   Attending Physician will be Dr. Allyn Donnelly COME  Report can be called to: [phone]  Care Team notified: [facility] Luke Sprang, RN, Delon Spalding, RN, Jerel Gravely, NP

## 2024-08-04 NOTE — ED Provider Notes (Signed)
 83 year old male awaiting head CT for medical clearance.  Past history of bipolar disorder, but appears to be very well-controlled.  Patient being very agitated while in the hospital as a visitor for his wife who is currently admitted.  Head CT negative, blood work reviewed.  Patient has been referred to TTS.   Mannie Pac T, DO 08/04/24 [phone]

## 2024-08-04 NOTE — ED Notes (Signed)
 Patient A&Ox4 for this RN at this time. During patient/RN interaction, patient consistently ended each sentence with faith by faith and frequently tried to redirect conversation to religion.   Patient calm and cooperative at this time. No apparent distress. Food, drink, and warm blankets provided to patient

## 2024-08-04 NOTE — ED Notes (Signed)
 IVC PAPERWORK ACCEPTED BY [facility] WAITING FOR THE OFFICER TO BRING FINDINGS AND CUSTODY TO COMPLETE PROCESS PAPERWORK IS LOCATED IN THE BLUE ZONE

## 2024-08-04 NOTE — ED Triage Notes (Addendum)
 Pt presents to the ER with an apparent altered mental status. Pt came to the ER as a visitor to his wife who is also in the ER at this time. Pt states he was called by God and told that his wife was discharged. Pt has a religious fixation, presents as A/O4 and GCS 15. Pt starts randomly praying and ending all sentences by saying by god Is now refusing to leave the room after his wife has been moved to another room.  Pt is also refusing vitals, states faith by faith, I am well. I don't need vitals taken.

## 2024-08-04 NOTE — ED Provider Notes (Signed)
 [place] EMERGENCY DEPARTMENT AT [facility] Provider Note   CSN: [id number] Arrival date & time: 08/04/24  9352     Patient presents with: Altered Mental Status   [name] is a 83 y.o. male.   83 year old male who initially presented to the emergency department with altered mental status.  Patient was initially accompanying his wife to the ER overnight but became very agitated when his wife did not get discharge.  He reportedly stated that  when we got married we became 1 and I am healthy so she is healthy. The patient and his son reportedly got into an argument in the waiting room and his son left.  Patient has been refusing to leave the ER room and has been hyperfocused on religion.  He was reportedly praying repeatedly and ending all sentences by saying by God.  He was separated from his wife due to staff concerns that he was trying to leave the ED with her.  He has not refused to leave the ER room since she was moved to a different room.  Staff reportedly tried to call their son but there was no answer.  Chart review shows a history of bipolar disorder.   Altered Mental Status      Prior to Admission medications   Medication Sig Start Date End Date Taking? Authorizing Provider  aspirin 81 MG chewable tablet Chew 1 tablet (81 mg total) by mouth daily. 08/03/24   Hayes Beckey CROME, NP  atorvastatin (LIPITOR) 80 MG tablet Take 1 tablet (80 mg total) by mouth daily. 08/03/24   Hayes Beckey CROME, NP  carboxymethylcellulose (REFRESH PLUS) 0.5 % SOLN Place 1 drop into both eyes daily as needed (dry eyes).    [provider]  dapagliflozin propanediol (FARXIGA) 10 MG TABS tablet Take 1 tablet (10 mg total) by mouth daily. 08/03/24   Hayes Beckey CROME, NP  docusate sodium  (COLACE) 100 MG capsule Take 200 mg by mouth 2 (two) times daily as needed for mild constipation. Patient not taking: Reported on 08/03/2024    [provider]  fluticasone (FLONASE) 50 MCG/ACT  nasal spray Place 2 sprays into both nostrils daily as needed for allergies. Patient not taking: Reported on 08/03/2024 06/24/18   [provider]  furosemide (LASIX) 20 MG tablet Take 1 tablet (20 mg total) by mouth daily. 08/03/24   Hayes Beckey CROME, NP  levothyroxine (SYNTHROID) 75 MCG tablet Take 1 tablet (75 mcg total) by mouth daily before breakfast. 08/03/24   Hayes Beckey CROME, NP  polyethylene glycol (MIRALAX  / GLYCOLAX ) 17 g packet Take 17 g by mouth 2 (two) times daily as needed for mild constipation. Patient not taking: Reported on 08/03/2024    [provider]    Allergies: Patient has no known allergies.    Review of Systems  All other systems reviewed and are negative.   Updated Vital Signs BP (!) 152/86   Pulse 71   Resp 18   Wt 68 kg   SpO2 100%   BMI 24.20 kg/m   Physical Exam HENT:     Head: Normocephalic and atraumatic.     Mouth/Throat:     Mouth: Mucous membranes are moist.  Eyes:     Conjunctiva/sclera: Conjunctivae normal.  Cardiovascular:     Comments: Limited exam due to patient compliance Pulmonary:     Effort: Pulmonary effort is normal. No respiratory distress.  Skin:    General: Skin is warm.  Neurological:     Mental  Status: He is alert and oriented to person, place, and time.  Psychiatric:        Mood and Affect: Mood is anxious.        Speech: Speech is rapid and pressured and slurred.        Thought Content: Thought content is delusional.     (all labs ordered are listed, but only abnormal results are displayed) Labs Reviewed  COMPREHENSIVE METABOLIC PANEL WITH GFR - Abnormal; Notable for the following components:      Result Value   Glucose, Bld 101 (*)    Total Bilirubin 1.3 (*)    All other components within normal limits  CBC WITH DIFFERENTIAL/PLATELET - Abnormal; Notable for the following components:   RBC 4.13 (*)    Hemoglobin 11.7 (*)    HCT 36.9 (*)    All other components within normal limits  SALICYLATE  LEVEL - Abnormal; Notable for the following components:   Salicylate Lvl <7.0 (*)    All other components within normal limits  ACETAMINOPHEN  LEVEL - Abnormal; Notable for the following components:   Acetaminophen  (Tylenol ), Serum <10 (*)    All other components within normal limits  URINALYSIS, ROUTINE W REFLEX MICROSCOPIC - Abnormal; Notable for the following components:   Protein, ur 100 (*)    All other components within normal limits  ETHANOL  RAPID URINE DRUG SCREEN, HOSP PERFORMED    EKG: EKG Interpretation Date/Time:  Thursday August 04 2024 08:28:53 EDT Ventricular Rate:  73 PR Interval:    QRS Duration:  160 QT Interval:  459 QTC Calculation: 506 R Axis:   134  Text Interpretation: Afib/flut and V-paced complexes No further rhythm analysis attempted due to paced rhythm RBBB and LPFB Probable anteroseptal infarct, recent Confirmed by [place], Bonnie Roig [phone]) on 08/04/2024 10:29:15 AM  Radiology: No results found.   Procedures   Medications Ordered in the ED - No data to display                                  Medical Decision Making 83 year old male in the emergency department with altered mental status and concern for acute psychosis.  Includes but not limited to acute psychosis, bipolar disorder, delirium, UTI, and others. He is refusing to leave the ED room and was initially refusing vitals.  TTS was consulted given our concern for acute psychosis given his hyperreligious comments and nonsensical speech. TTS has requested psych labs.  Psychiatry has evaluated the patient and does not believe he is experiencing acute delirium or exacerbation of dementia.  He is presenting as though he is suffering from acute psychosis in the setting of his bipolar disease.  Psychiatry plans on admitting him to their [facility] psych unit.  Amount and/or Complexity of Data Reviewed Labs: ordered.    Final diagnoses:  Psychosis, unspecified psychosis type [facility])    ED Discharge  Orders     None          Martin Belling, DO 08/04/24 1146

## 2024-08-04 NOTE — ED Notes (Signed)
 All belongings given to security. (Shoes, pants, shirt, wallet, money, brass key, belt, two shirts)

## 2024-08-04 NOTE — ED Notes (Signed)
 Pt stated he was requesting something to put me to sleep so I can leave pt was offered an IM med to help, he agreed.
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
## 2024-08-05 ENCOUNTER — Encounter: Payer: Self-pay | Admitting: Psychiatry

## 2024-08-05 ENCOUNTER — Other Ambulatory Visit: Payer: Self-pay

## 2024-08-05 ENCOUNTER — Inpatient Hospital Stay
Admission: AD | Admit: 2024-08-05 | Discharge: 2024-08-08 | DRG: 885 | Disposition: A | Discharge status: Home / Self Care | Payer: Self-pay | Source: Intra-hospital | Attending: Psychiatry | Admitting: Psychiatry

## 2024-08-05 DIAGNOSIS — E039 Hypothyroidism, unspecified: Secondary | ICD-10-CM | POA: Diagnosis present

## 2024-08-05 DIAGNOSIS — I5082 Biventricular heart failure: Secondary | ICD-10-CM | POA: Diagnosis not present

## 2024-08-05 DIAGNOSIS — N1831 Chronic kidney disease, stage 3a: Secondary | ICD-10-CM | POA: Diagnosis present

## 2024-08-05 DIAGNOSIS — Z8249 Family history of ischemic heart disease and other diseases of the circulatory system: Secondary | ICD-10-CM | POA: Diagnosis not present

## 2024-08-05 DIAGNOSIS — E1122 Type 2 diabetes mellitus with diabetic chronic kidney disease: Secondary | ICD-10-CM | POA: Diagnosis present

## 2024-08-05 DIAGNOSIS — N400 Enlarged prostate without lower urinary tract symptoms: Secondary | ICD-10-CM | POA: Diagnosis present

## 2024-08-05 DIAGNOSIS — Z833 Family history of diabetes mellitus: Secondary | ICD-10-CM | POA: Diagnosis not present

## 2024-08-05 DIAGNOSIS — E785 Hyperlipidemia, unspecified: Secondary | ICD-10-CM | POA: Diagnosis not present

## 2024-08-05 DIAGNOSIS — I2510 Atherosclerotic heart disease of native coronary artery without angina pectoris: Secondary | ICD-10-CM | POA: Diagnosis present

## 2024-08-05 DIAGNOSIS — Z87891 Personal history of nicotine dependence: Secondary | ICD-10-CM | POA: Diagnosis not present

## 2024-08-05 DIAGNOSIS — Z7982 Long term (current) use of aspirin: Secondary | ICD-10-CM

## 2024-08-05 DIAGNOSIS — I130 Hypertensive heart and chronic kidney disease with heart failure and stage 1 through stage 4 chronic kidney disease, or unspecified chronic kidney disease: Secondary | ICD-10-CM | POA: Diagnosis present

## 2024-08-05 DIAGNOSIS — H9190 Unspecified hearing loss, unspecified ear: Secondary | ICD-10-CM | POA: Diagnosis present

## 2024-08-05 DIAGNOSIS — F29 Unspecified psychosis not due to a substance or known physiological condition: Principal | ICD-10-CM | POA: Diagnosis present

## 2024-08-05 DIAGNOSIS — Z953 Presence of xenogenic heart valve: Secondary | ICD-10-CM | POA: Diagnosis not present

## 2024-08-05 DIAGNOSIS — Z79890 Hormone replacement therapy (postmenopausal): Secondary | ICD-10-CM

## 2024-08-05 MED ORDER — FUROSEMIDE 20 MG PO TABS
20.0000 mg | ORAL_TABLET | Freq: Every day | ORAL | Status: DC
  Administered 2024-08-07 – 2024-08-08 (×2): 20 mg via ORAL
  Filled 2024-08-05 (×3): qty 1

## 2024-08-05 MED ORDER — OLANZAPINE 10 MG IM SOLR: 5.0000 mg | Freq: Three times a day (TID) | INTRAMUSCULAR | Status: DC | PRN

## 2024-08-05 MED ORDER — MAGNESIUM HYDROXIDE 400 MG/5ML PO SUSP: 30.0000 mL | Freq: Every day | ORAL | Status: DC | PRN

## 2024-08-05 MED ORDER — ALUM & MAG HYDROXIDE-SIMETH 200-200-20 MG/5ML PO SUSP: 30.0000 mL | ORAL | Status: DC | PRN

## 2024-08-05 MED ORDER — LEVOTHYROXINE SODIUM 75 MCG PO TABS
75.0000 ug | ORAL_TABLET | Freq: Every day | ORAL | Status: DC
Start: 2024-08-06 — End: 2024-08-08
  Administered 2024-08-07 – 2024-08-08 (×2): 75 ug via ORAL
  Filled 2024-08-05 (×3): qty 1

## 2024-08-05 MED ORDER — RISPERIDONE 0.25 MG PO TABS
0.2500 mg | ORAL_TABLET | Freq: Two times a day (BID) | ORAL | Status: DC
Start: 2024-08-05 — End: 2024-08-07
  Administered 2024-08-05: 0.25 mg via ORAL
  Filled 2024-08-05 (×5): qty 1

## 2024-08-05 MED ORDER — OLANZAPINE 5 MG PO TBDP: 5.0000 mg | ORAL_TABLET | Freq: Three times a day (TID) | ORAL | Status: DC | PRN

## 2024-08-05 MED ORDER — DAPAGLIFLOZIN PROPANEDIOL 10 MG PO TABS
10.0000 mg | ORAL_TABLET | Freq: Every day | ORAL | Status: DC
  Administered 2024-08-07 – 2024-08-08 (×2): 10 mg via ORAL
  Filled 2024-08-05 (×4): qty 1

## 2024-08-05 MED ORDER — ASPIRIN 81 MG PO CHEW
81.0000 mg | CHEWABLE_TABLET | Freq: Every day | ORAL | Status: DC
  Administered 2024-08-07 – 2024-08-08 (×2): 81 mg via ORAL
  Filled 2024-08-05 (×3): qty 1

## 2024-08-05 MED ORDER — ATORVASTATIN CALCIUM 80 MG PO TABS
80.0000 mg | ORAL_TABLET | Freq: Every day | ORAL | Status: DC
  Administered 2024-08-07 – 2024-08-08 (×2): 80 mg via ORAL
  Filled 2024-08-05 (×3): qty 1

## 2024-08-05 MED ORDER — ACETAMINOPHEN 325 MG PO TABS: 650.0000 mg | ORAL_TABLET | Freq: Four times a day (QID) | ORAL | Status: DC | PRN

## 2024-08-05 NOTE — Group Note (Signed)
 Date:  08/05/2024 Time:  11:04 AM  Group Topic/Focus:  Self Care:   The focus of this group is to help patients understand the importance of self-care in order to improve or restore emotional, physical, spiritual, interpersonal, and financial health.    Participation Level:  Did Not Attend   [name] 08/05/2024, 11:04 AM

## 2024-08-05 NOTE — Group Note (Signed)
 Recreation Therapy Group Note   Group Topic:Stress Management  Group Date: 08/05/2024 Start Time: 1415 End Time: 1455 Facilitators: Celestia Jeoffrey BRAVO, LRT, CTRS Location: Courtyard  Group Description: Meditation. LRT and patients discussed what they know about meditation and mindfulness. LRT played a Deep Breathing Meditation exercise script for patients to follow along to. LRT and patients discussed how meditation and deep breathing can be used as a coping skill post--discharge to help manage symptoms of stress.   Goal Area(s) Addressed: Patient will practice using relaxation technique. Patient will identify a new coping skill.  Patient will follow multistep directions to reduce anxiety and stress.  Affect/Mood: N/A   Participation Level: Did not attend    Clinical Observations/Individualized Feedback: Patient did not attend group.   Plan: Continue to engage patient in RT group sessions 2-3x/week.   Jeoffrey BRAVO Celestia, LRT, CTRS 08/05/2024 4:24 PM

## 2024-08-05 NOTE — Group Note (Addendum)
 Physical/Occupational Therapy Group Note  Group Topic: Estate manager/land agent, Adaptive Equipment for ADLs  Group Date: 08/05/2024 Start Time: 1310 End Time: 1345 Facilitators: Lavonda Therisa CROME, OT   Group Description: Group educated on safety considerations/modifications with bathing, dressing and ADL routines to maximize independence and minimize fall risk with tasks.  Reviewed and demonstrated use of shower chair and tub transfer bench as appropriate.  Reviewed and demonstrated use of adaptive equipment (sock aide, reacher, long-handled sponge) available for ADL tasks.  Reviewed and demonstrated role of activity pacing and energy conservation with functional activities.  Provided handout with visual reference of available equipment.  Therapeutic Goal(s):  Verbalize and demonstrate safe technique for tub/shower transfers with DME/adaptive equipment as needed. Verbalize and demonstrate appropriate use of adaptive equipment with bathing, dressing and ADL routine. Verbalize and demonstrate appropriate use of activity pacing and energy conservation with bathing, dressing and ADL routine.  Individual Participation: Did not attend   Participation Level: Did not attend   Participation Quality:    Behavior:    Speech/Thought Process:    Affect/Mood:    Insight:   Judgement:    Modes of Intervention:   Patient Response to Interventions:    Plan: Continue to engage patient in PT/OT groups 1 - 2x/week.  08/05/2024  Therisa CROME Lavonda, OT  Therisa Lavonda, OTD OTR/L  08/05/24, 3:30 PM

## 2024-08-05 NOTE — Plan of Care (Signed)
   Problem: Education: Goal: Knowledge of [facility] Education information/materials will improve Outcome: Not Progressing Goal: Emotional status will improve Outcome: Not Progressing Goal: Mental status will improve Outcome: Not Progressing Goal: Verbalization of understanding the information provided will improve Outcome: Not Progressing

## 2024-08-05 NOTE — ED Notes (Signed)
 Sheriff called to arrange transport to [facility].

## 2024-08-05 NOTE — ED Provider Notes (Signed)
 Emergency Medicine Observation Re-evaluation Note  [name] is a 83 y.o. male, seen on rounds today.  Pt initially presented to the ED for complaints of Altered Mental Status Currently, the patient is resting and awaiting transport.  Physical Exam  BP (!) 145/87   Pulse 75   Temp 98.6 F (37 C) (Oral)   Resp 20   Wt 68 kg   SpO2 100%   BMI 24.20 kg/m  Physical Exam General: No acute distress Cardiac: Regular rate rhythm normal blood pressure Lungs: Normal respiratory rate with normal oxygen saturation Psych: Resting at this time  ED Course / MDM  EKG:EKG Interpretation Date/Time:  Thursday August 04 2024 08:28:53 EDT Ventricular Rate:  73 PR Interval:    QRS Duration:  160 QT Interval:  459 QTC Calculation: 506 R Axis:   134  Text Interpretation: Afib/flut and V-paced complexes No further rhythm analysis attempted due to paced rhythm 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  Recent changes in the last 24 hours include patient was medically cleared and psychiatric evaluation completed.  Plan  Current plan is for admission to behavioral health.    Levander Houston, MD 08/05/24 [phone]

## 2024-08-05 NOTE — Progress Notes (Addendum)
 ADMISSION NOTE:  Patient is an 83 year old male admitted involuntarily to the [facility] floor from [facility]  ED with a diagnosis of unspecified psychosis. Patient went to visit his wife in the emergency room, but upon learning that she would not be discharging, began experiencing a psychotic episode. Per the chart, patient repeatedly made religious statements all ending with the phrase 'by God'. Patient also made a reference to being married and since they are now 1 person, she must be healthy because he is healthy. Patient continued to have altered mental status in the emergency room which was worsened when his wife was moved to a different floor. Patient presents to assessment via wheelchair but is ambulatory with assistance. He is A+O x2. He currently denies SI/HI/AVH but appears lethargic and is slurring words. Patient's affect and mood are sad.  Patient unable to state if he is depressed or anxious. Patient appears to not be in pain. Unknown if patient uses a mobility aid at home. Denies the use of eyeglasses. Last BM today 10.24. Patient is a former smoker. UDS negative for substances. Denies the use of alcohol. Unable to cite what his goals while admitted. Per the chart, his support system is his son Nutritional therapist.  Skin assessment and body search completed with Harlene, MHT. Skin: warm/dry, left chest and left abdominal bruising (recent cardiac procedures). No contrabands found.  Emotional support and reassurance provided throughout admission intake. Consents unable to be signed. Afterwards, oriented patient to unit, room and call light, reviewed POC. No concerns voiced. Patient verbalized understanding. Expressed the need for the head of bed to be elevated. Will continue to monitor with ongoing Q 15 minute safety checks per unit protocol.

## 2024-08-05 NOTE — Progress Notes (Addendum)
   08/05/24 1045  Spiritual Encounters  Type of Visit Initial  Care provided to: Patient  Conversation partners present during encounter Nurse  Reason for visit Routine spiritual support  OnCall Visit No   Chaplain visited patient per staff referral.  Patient was alert and welcoming and shared about his faith but seemed to be confused when he called himself Jesus Christ, etc.  Chaplain replied kindly and shared the encounter with staff who agreed there may be confusion due to wife not being present with him.  SABRA Hart HERO. Nicholaus, M.Div. Chaplain Resident [facility]

## 2024-08-05 NOTE — Tx Team (Signed)
 Initial Treatment Plan 08/05/2024 1:08 PM [name] FMW:[id number]    PATIENT STRESSORS: Health problems     PATIENT STRENGTHS: Supportive family/friends    PATIENT IDENTIFIED PROBLEMS:   Unable to identify                   DISCHARGE CRITERIA:  Ability to meet basic life and health needs Adequate post-discharge living arrangements Improved stabilization in mood, thinking, and/or behavior Medical problems require only outpatient monitoring Safe-care adequate arrangements made  PRELIMINARY DISCHARGE PLAN: Return to previous living arrangement  PATIENT/FAMILY INVOLVEMENT: This treatment plan has been presented to and reviewed with the patient, [name]. The patient has been given the opportunity to ask questions and make suggestions  Garen CINDERELLA Daring, RN 08/05/2024, 1:08 PM

## 2024-08-06 DIAGNOSIS — F29 Unspecified psychosis not due to a substance or known physiological condition: Principal | ICD-10-CM

## 2024-08-06 LAB — LIPID PANEL
Cholesterol: 144 mg/dL (ref 0–200)
HDL: 44 mg/dL (ref >40)
LDL Cholesterol: 89 mg/dL (ref 0–99)
Total CHOL/HDL Ratio: 3.3 ratio
Triglycerides: 57 mg/dL (ref <150)
VLDL: 11 mg/dL (ref 0–40)

## 2024-08-06 LAB — HEMOGLOBIN A1C
Hgb A1c MFr Bld: 6 % — ABNORMAL HIGH (ref 4.8–5.6)
Mean Plasma Glucose: 125.5 mg/dL

## 2024-08-06 LAB — GLUCOSE, CAPILLARY
Glucose-Capillary: 86 mg/dL (ref 70–99)
Glucose-Capillary: 96 mg/dL (ref 70–99)
Glucose-Capillary: 98 mg/dL (ref 70–99)

## 2024-08-06 NOTE — Group Note (Signed)
 Date:  08/06/2024 Time:  9:05 PM  Group Topic/Focus:  Healthy Communication:   The focus of this group is to discuss communication, barriers to communication, as well as healthy ways to communicate with others.    Participation Level:  Active  Participation Quality:  Appropriate  Affect:  Appropriate  Cognitive:  Appropriate  Insight: Good  Engagement in Group:  Engaged  Modes of Intervention:  Discussion  Additional Comments:    Romero Earnie Hope 08/06/2024, 9:05 PM

## 2024-08-06 NOTE — Progress Notes (Signed)
   08/06/24 1300  Psych Admission Type (Psych Patients Only)  Admission Status Involuntary  Psychosocial Assessment  Patient Complaints Tension  Eye Contact Fair  Facial Expression Blank (appropriate)  Affect Flat  Speech Argumentative  Interaction Guarded  Motor Activity Unsteady  Appearance/Hygiene In scrubs  Behavior Characteristics Guarded  Mood Preoccupied  Thought Process  Coherency Concrete thinking  Content Religiosity  Delusions None reported or observed  Perception WDL  Hallucination None reported or observed  Judgment Impaired  Confusion Mild  Danger to Self  Current suicidal ideation? Denies  Danger to Others  Danger to Others None reported or observed

## 2024-08-06 NOTE — Plan of Care (Signed)
  Problem: Activity: Goal: Sleeping patterns will improve Outcome: Progressing   

## 2024-08-06 NOTE — Inpatient Diabetes Management (Signed)
 Inpatient Diabetes Program Recommendations  AACE/ADA: New Consensus Statement on Inpatient Glycemic Control   Target Ranges:  Prepandial:   less than 140 mg/dL      Peak postprandial:   less than 180 mg/dL (1-2 hours)      Critically ill patients:  140 - 180 mg/dL    Latest Reference Range & Units 08/06/24 11:43  Glucose-Capillary 70 - 99 mg/dL 86    Review of Glycemic Control  Diabetes history: DM2 Outpatient Diabetes medications: Farxiga 10 mg daily Current orders for Inpatient glycemic control: Farxiga 10 mg daily  Inpatient Diabetes Program Recommendations:    CBG monitoring: Currently ordered CBGs TID after meals.  Please consider changing CBGs to AC&HS.  If patient has any issues with hypoglycemia, please discontinue Farxiga.   NOTE: Noted consult for Diabetes Coordinator. Diabetes Coordinator is not on campus over the weekend but available by pager from 8am to 5pm for questions or concerns. Patient is currently inpatient gero psych unit. Would recommend to check CBGs before meals and at bedtime. If patient has any issues with hypoglycemia, would discontinue the Farxiga. Will follow along and make further recommendations if needed.  Thanks, Earnie Gainer, RN, MSN, CDCES Diabetes Coordinator Inpatient Diabetes Program [phone] (Team Pager from 8am to 5pm)

## 2024-08-06 NOTE — Group Note (Incomplete)
 Date:  08/06/2024 Time:  9:07 PM  Group Topic/Focus:  Healthy Communication:   The focus of this group is to discuss communication, barriers to communication, as well as healthy ways to communicate with others.    Participation Level:  Active  Participation Quality:  Appropriate  Affect:  Appropriate  Cognitive:  Appropriate  Insight: Good  Engagement in Group:  Engaged  Modes of Intervention:  Discussion  Additional Comments:    [name] 08/06/2024, 9:07 PM

## 2024-08-06 NOTE — Group Note (Signed)
 Date:  08/06/2024 Time:  8:57 PM  Group Topic/Focus:  Healthy Communication:   The focus of this group is to discuss communication, barriers to communication, as well as healthy ways to communicate with others.    Participation Level:  Active  Participation Quality:  Appropriate  Affect:  Appropriate  Cognitive:  Appropriate  Insight: Good  Engagement in Group:  Engaged  Modes of Intervention:  Discussion  Additional Comments:    [name] 08/06/2024, 8:57 PM

## 2024-08-06 NOTE — H&P (Signed)
 Psychiatric Admission Assessment Adult  Patient Identification: [name] MRN:  [id number] Date of Evaluation:  08/06/2024 Chief Complaint:  Psychosis, unspecified psychosis type (HCC) [F29]   History of Present Illness: [name] is a 83 y.o. Caucasian male with a past psychiatric history of bipolar disorder unspecified, with pertinent medical comorbidities/history that include biventricular heart failure, transcatheter aortic valve replacement, CAD, CKD stage IIIa, CHB, essential hypertension, hyperlipidemia, BPH, sleep apnea, type 2 diabetes, GERD, and acquired hypothyroidism, who presented this encounter by way of self with with the patient's son to reportedly pick up the patient's wife, when it was appreciated by ED staff and the patient's son, that the patient was in an altered mental state, thus the EDP team intervened to address the patient's altered mental status, who upon EDP examination, consulted psychiatry for specialty evaluation and recommendations. Patient is admitted to [facility] unit with Q15 min safety monitoring. Multidisciplinary team approach is offered. Medication management; group/milieu therapy is offered.   On assessment patient is noted to be resting in bed.  He has hard of hearing but responded to verbal commands.  He is able to answer the month as October and the year is 2025 and the date is October 23.  He is unable to recall the events that led up to the hospitalization.  He is able to acknowledge the president as Nancyann Frohlich.  He remains hyperreligious and most of the responses to the questions regarding his mood and behaviors is mainly my faith, my faith.  He noted to be repeating the word my faith multiple times.  He denies feeling depressed or anxious.  He denies any hallucinations and denies safety concerns including thoughts of suicide.  When asked about his family he kept repeating the word my fake and named his children and wife.  Interview is limited as  patient is poor historian and has perseverative thought processes by repeating the word my faith for all the questions  Provider called patient's son who expresses frustration about the admission to the psychiatric unit.  Patient expressed concerns about his father having cardiac history and scheduled to have heart surgery but instead forced [facility] transfer to psychiatric hospital.  Patient's son reports he is not concerned about his father's hyperreligiosity as his father was reportedly always religious. Total Time spent with patient: 1 hour Sleep  Sleep:Sleep: Fair  Past Psychiatric History:  Psychiatric History:  Information collected from patient/chart  PPrev Dx/Sx: Family denies any psychiatric history, but upon chart review, appreciably diagnosed with unspecified bipolar in 2010 Current Psych Provider: None Home Meds (current): No psychiatric medications, just medical medications Previous Med Trials: None Therapy: None   Prior Psych Hospitalization: None  Prior Self Harm: None Prior Violence: None   Family Psych History: None Family Hx suicide: None   Social History:  Developmental Hx: WDL Educational Hx: WDL 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 No.  Has the patient been a risk to self in the past 6 months? No.  Has the patient been a risk to self within the distant past? No.  Is the patient a risk to others? No.  Has the patient  been a risk to others in the past 6 months? No.  Has the patient been a risk to others within the distant past? No.   Columbia Scale:  Flowsheet Row Admission  (Current) from 08/05/2024 in [facility] BEHAVIORAL MEDICINE ED from 08/04/2024 in [facility] Emergency Department at [facility] Admission (Discharged) from 07/26/2024 in [facility] CARDIOVASCULAR ICU  C-SSRS RISK CATEGORY No Risk No Risk No Risk     Past Medical History:  Past Medical History:  Diagnosis Date   Aortic stenosis, severe    Diabetes mellitus without complication (HCC)    Hypertension    Hypothyroidism    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 Verlin and Dr. Shyrl   Sleep apnea     Past Surgical History:  Procedure Laterality Date   APPENDECTOMY     INTRAOPERATIVE TRANSTHORACIC ECHOCARDIOGRAM N/A 07/26/2024   Procedure: ECHOCARDIOGRAM, TRANSTHORACIC;  Surgeon: Verlin Lonni BIRCH, MD;  Location: [facility] INVASIVE CV LAB;  Service: Cardiovascular;  Laterality: N/A;   PACEMAKER IMPLANT N/A 07/28/2024   Procedure: PACEMAKER IMPLANT;  Surgeon: Inocencio Soyla Lunger, MD;  Location: [facility] INVASIVE CV LAB;  Service: Cardiovascular;  Laterality: N/A;   RIGHT HEART CATH AND CORONARY ANGIOGRAPHY N/A 07/18/2024   Procedure: RIGHT HEART CATH AND CORONARY ANGIOGRAPHY;  Surgeon: Zenaida Morene PARAS, MD;  Location: [facility] INVASIVE CV LAB;  Service: Cardiovascular;  Laterality: N/A;   Family History:  Family History  Problem Relation Age of Onset   CAD Father    Diabetes Mellitus II Maternal Grandmother     Social History:  Social History   Substance and Sexual Activity  Alcohol Use Yes     Social History   Substance and Sexual Activity  Drug Use Never      Allergies:  No Known Allergies Lab Results:  Results for orders placed or performed during the hospital encounter of 08/05/24 (from the past 48 hours)  Hemoglobin A1c     Status: Abnormal   Collection Time: 08/06/24  7:59 AM  Result Value Ref Range   Hgb A1c MFr Bld 6.0 (H) 4.8 - 5.6 %     Comment: (NOTE) Diagnosis of Diabetes The following HbA1c ranges recommended by the American Diabetes Association (ADA) may be used as an aid in the diagnosis of diabetes mellitus.  Hemoglobin             Suggested A1C NGSP%              Diagnosis  <5.7                   Non Diabetic  5.7-6.4                Pre-Diabetic  >6.4                   Diabetic  <7.0                   Glycemic control for                       adults with diabetes.     Mean Plasma Glucose 125.5 mg/dL    Comment: Performed at [facility], [address] [place], [place] [address]  Lipid panel     Status: None   Collection Time: 08/06/24  7:59 AM  Result Value Ref Range   Cholesterol 144 0 - 200 mg/dL   Triglycerides 57 <849 mg/dL   HDL 44 >59 mg/dL   Total CHOL/HDL Ratio 3.3 RATIO   VLDL 11 0 - 40 mg/dL   LDL Cholesterol 89 0 - 99 mg/dL    Comment:        Total Cholesterol/HDL:CHD Risk Coronary Heart Disease Risk Table                     Men   Women  1/2 Average Risk   3.4   3.3  Average Risk       5.0   4.4  2 X Average Risk   9.6   7.1  3 X Average Risk  23.4   11.0        Use the calculated Patient Ratio above and the CHD Risk Table to determine the patient's CHD Risk.        ATP III CLASSIFICATION (LDL):  <100     mg/dL   Optimal  899-870  mg/dL   Near or Above                    Optimal  130-159  mg/dL   Borderline  839-810  mg/dL   High  >809     mg/dL   Very High Performed at [facility], [address] [place], [place] [address]   Glucose, capillary     Status: None   Collection Time: 08/06/24 11:43 AM  Result Value Ref Range   Glucose-Capillary 86 70 - 99 mg/dL    Comment: Glucose reference range applies only to samples taken after fasting for at least 8 hours.    Blood Alcohol level:  Lab Results  Component Value Date   [facility] <15 08/04/2024    Metabolic Disorder Labs:  Lab Results  Component Value Date   HGBA1C 6.0 (H) 08/06/2024   MPG 125.5  08/06/2024   MPG 131.24 07/16/2024   No results found for: PROLACTIN Lab Results  Component Value Date   CHOL 144 08/06/2024   TRIG 57 08/06/2024   HDL 44 08/06/2024   CHOLHDL 3.3 08/06/2024   VLDL 11 08/06/2024   LDLCALC 89 08/06/2024   LDLCALC 77 07/14/2024    Current Medications: Current Facility-Administered Medications  Medication Dose Route Frequency Provider Last Rate Last Admin   acetaminophen  (TYLENOL ) tablet 650 mg  650 mg Oral Q6H PRN Mannie Jerel PARAS, NP       alum & mag hydroxide-simeth (MAALOX/MYLANTA) 200-200-20 MG/5ML suspension 30 mL  30 mL Oral Q4H PRN Mannie Jerel PARAS, NP       aspirin chewable tablet 81 mg  81 mg Oral Daily Mannie Jerel PARAS, NP       atorvastatin (LIPITOR) tablet 80 mg  80 mg Oral Daily Mannie Jerel PARAS, NP       dapagliflozin propanediol (FARXIGA) tablet 10 mg  10 mg Oral Daily Mannie Jerel PARAS, NP       furosemide (LASIX) tablet 20 mg  20 mg Oral Daily Mannie Jerel PARAS, NP       levothyroxine (SYNTHROID) tablet 75 mcg  75 mcg Oral QAC breakfast Mannie Jerel PARAS, NP       magnesium hydroxide (MILK OF MAGNESIA) suspension 30 mL  30 mL Oral Daily PRN Mannie Jerel PARAS, NP       OLANZapine (ZYPREXA) injection 5 mg  5 mg Intramuscular TID PRN Mannie Jerel PARAS, NP  OLANZapine zydis (ZYPREXA) disintegrating tablet 5 mg  5 mg Oral TID PRN Mannie Jerel PARAS, NP       risperiDONE (RISPERDAL) tablet 0.25 mg  0.25 mg Oral BID Mannie Jerel PARAS, NP   0.25 mg at 08/05/24 2141   PTA Medications: Medications Prior to Admission  Medication Sig Dispense Refill Last Dose/Taking   aspirin 81 MG chewable tablet Chew 1 tablet (81 mg total) by mouth daily. 30 tablet 0    atorvastatin (LIPITOR) 80 MG tablet Take 1 tablet (80 mg total) by mouth daily. 30 tablet 1    carboxymethylcellulose (REFRESH PLUS) 0.5 % SOLN Place 1 drop into both eyes daily as needed (dry eyes).      dapagliflozin propanediol (FARXIGA) 10 MG TABS tablet Take 1 tablet (10 mg total) by mouth  daily. 30 tablet 1    docusate sodium  (COLACE) 100 MG capsule Take 200 mg by mouth 2 (two) times daily as needed for mild constipation. (Patient not taking: Reported on 08/03/2024)      fluticasone (FLONASE) 50 MCG/ACT nasal spray Place 2 sprays into both nostrils daily as needed for allergies. (Patient not taking: Reported on 08/03/2024)      furosemide (LASIX) 20 MG tablet Take 1 tablet (20 mg total) by mouth daily. 30 tablet 3    levothyroxine (SYNTHROID) 75 MCG tablet Take 1 tablet (75 mcg total) by mouth daily before breakfast. 30 tablet 1    polyethylene glycol (MIRALAX  / GLYCOLAX ) 17 g packet Take 17 g by mouth 2 (two) times daily as needed for mild constipation. (Patient not taking: Reported on 08/03/2024)       Psychiatric Specialty Exam:  Presentation  General Appearance:  Bizarre  Eye Contact: Fleeting  Speech: Slow  Speech Volume: Decreased    Mood and Affect  Mood: Anxious  Affect: Appropriate   Thought Process  Thought Processes: Disorganized  Descriptions of Associations:Intact  Orientation:Partial  Thought Content:Illogical; Rumination  Hallucinations:Hallucinations: None  Ideas of Reference:Delusions  Suicidal Thoughts:Suicidal Thoughts: No  Homicidal Thoughts:Homicidal Thoughts: No   Sensorium  Memory: Immediate Fair; Remote Poor; Recent Poor  Judgment: Impaired  Insight: Shallow   Executive Functions  Concentration: Poor  Attention Span: Poor  Recall: Poor  Fund of Knowledge: Poor  Language: Fair   Psychomotor Activity  Psychomotor Activity: Psychomotor Activity: Normal   Assets  Assets: Communication Skills; Desire for Improvement; Physical Health    Musculoskeletal: Strength & Muscle Tone: decreased Gait & Station: unsteady  Physical Exam: Physical Exam Vitals and nursing note reviewed.  HENT:     Nose: Nose normal.     Mouth/Throat:     Mouth: Mucous membranes are moist.  Cardiovascular:      Pulses: Normal pulses.  Pulmonary:     Effort: Pulmonary effort is normal.  Abdominal:     Palpations: Abdomen is soft.  Neurological:     Mental Status: He is alert.    Review of Systems  Constitutional: Negative.   HENT: Negative.    Eyes: Negative.   Skin: Negative.    Blood pressure (!) 149/83, pulse 71, temperature (!) 97.5 F (36.4 C), resp. rate 18, height 5' 1 (1.549 m), weight 67.1 kg, SpO2 98%. Body mass index is 27.96 kg/m.  Principal Diagnosis: Psychosis, unspecified psychosis type (HCC) Diagnosis:  Principal Problem:   Psychosis, unspecified psychosis type [facility])   Clinical Decision Making: Patient with remote history of bipolar disorder, complex medical history of cardiac issues and recent cardiac surgery involving pacemaker, brought in for  increasing confusion and aggression.  Patient is admitted to inpatient psychiatric facility for managing the behaviors  Treatment Plan Summary:  Safety and Monitoring:            Involuntary admission to inpatient psychiatric unit for safety, stabilization and treatment             -- Daily contact with patient to assess and evaluate symptoms and progress in treatment             -- Patient's case to be discussed in multi-disciplinary team meeting             -- Observation Level: q15 minute checks             -- Vital signs:  q12 hours             -- Precautions: suicide, elopement, and assault   2. Psychiatric Diagnoses and Treatment:              EKG reflects A-fib and prolonged QTc-Will switch Risperdal to Abilify.     -- The risks/benefits/side-effects/alternatives to this medication were discussed in detail with the patient and time was given for questions. The patient consents to medication trial.                -- Metabolic profile and EKG monitoring obtained while on an atypical antipsychotic (BMI: Lipid Panel: HbgA1c: QTc:)              -- Encouraged patient to participate in unit milieu and in scheduled group  therapies                            3. Medical Issues Being Addressed:      4. Discharge Planning:              -- Social work and case management to assist with discharge planning and identification of hospital follow-up needs prior to discharge             -- Estimated LOS: 5-7 days             -- Discharge Concerns: Need to establish a safety plan; Medication compliance and effectiveness             -- Discharge Goals: Return home with outpatient referrals follow ups  Physician Treatment Plan for Primary Diagnosis: Psychosis, unspecified psychosis type (HCC) Long Term Goal(s): Improvement in symptoms so as ready for discharge  Short Term Goals: Ability to identify changes in lifestyle to reduce recurrence of condition will improve, Ability to verbalize feelings will improve, Ability to disclose and discuss suicidal ideas, Ability to demonstrate self-control will improve, and Ability to identify and develop effective coping behaviors will improve  Physician Treatment Plan for Secondary Diagnosis: Principal Problem:   Psychosis, unspecified psychosis type (HCC)  Long Term Goal(s): Improvement in symptoms so as ready for discharge  Short Term Goals: Ability to identify changes in lifestyle to reduce recurrence of condition will improve, Ability to verbalize feelings will improve, Ability to disclose and discuss suicidal ideas, Ability to demonstrate self-control will improve, and Ability to identify and develop effective coping behaviors will improve  I certify that inpatient services furnished can reasonably be expected to improve the patient's condition.    Lynett Brasil, MD 10/25/20251:37 PM

## 2024-08-06 NOTE — Progress Notes (Signed)
   08/05/24 2100  Psych Admission Type (Psych Patients Only)  Admission Status Involuntary  Psychosocial Assessment  Patient Complaints Malaise  Eye Contact Brief  Facial Expression Flat  Affect Sad  Speech Slurred  Interaction Cautious  Motor Activity Unsteady  Appearance/Hygiene In scrubs  Behavior Characteristics Calm  Mood Sad  Thought Process  Coherency Disorganized  Content UTA  Delusions None reported or observed  Perception UTA  Hallucination None reported or observed  Judgment Impaired  Confusion Moderate  Danger to Self  Current suicidal ideation? Denies  Danger to Others  Danger to Others None reported or observed

## 2024-08-06 NOTE — Group Note (Signed)
 Date:  08/06/2024 Time:  10:48 AM  Group Topic/Focus:  Goals/Overcoming Obstacles Group:   The focus of this group is to help patients establish daily goals to achieve during treatment and discuss how the patient can incorporate goal setting into their daily lives to aide in recovery. Patients also went over a overcoming obstacles worksheet and learned what the difference between each obstacle they are facing.     Participation Level:  Did Not Attend  Beatris ONEIDA Hasten 08/06/2024, 10:48 AM

## 2024-08-06 NOTE — Plan of Care (Signed)
   Problem: Education: Goal: Emotional status will improve Outcome: Progressing Goal: Mental status will improve Outcome: Progressing

## 2024-08-06 NOTE — Group Note (Signed)
 Date:  08/06/2024 Time:  5:31 PM  Group Topic/Focus:  Healthy Communication:   The focus of this group is to discuss communication, barriers to communication, as well as healthy ways to communicate with others.    Participation Level:  Active  Participation Quality:  Appropriate  Affect:  Appropriate  Cognitive:  Appropriate  Insight: Appropriate  Engagement in Group:  Engaged  Modes of Intervention:  Discussion   [name] 08/06/2024, 5:31 PM

## 2024-08-06 NOTE — Group Note (Signed)
 Date:  08/06/2024 Time:  9:00 PM  Group Topic/Focus:  Healthy Communication:   The focus of this group is to discuss communication, barriers to communication, as well as healthy ways to communicate with others.    Participation Level:  Active  Participation Quality:  Appropriate  Affect:  Appropriate  Cognitive:  Appropriate  Insight: Appropriate and Good  Engagement in Group:  Engaged  Modes of Intervention:  Discussion  Additional Comments:    [name] 08/06/2024, 9:00 PM

## 2024-08-06 NOTE — BHH Suicide Risk Assessment (Signed)
 [facility] Admission Suicide Risk Assessment   Nursing information obtained from:  Review of record Demographic factors:  Male, Age 83 or older, Caucasian Current Mental Status:  NA Loss Factors:  Decline in physical health Historical Factors:  NA Risk Reduction Factors:  Living with another person, especially a relative, Sense of responsibility to family  Total Time spent with patient: 30 minutes Principal Problem: Psychosis, unspecified psychosis type (HCC) Diagnosis:  Principal Problem:   Psychosis, unspecified psychosis type (HCC)  Subjective Data: [name] is a 83 y.o. Caucasian male with a past psychiatric history of bipolar disorder unspecified, with pertinent medical comorbidities/history that include biventricular heart failure, transcatheter aortic valve replacement, CAD, CKD stage IIIa, CHB, essential hypertension, hyperlipidemia, BPH, sleep apnea, type 2 diabetes, GERD, and acquired hypothyroidism, who presented this encounter by way of self with with the patient's son to reportedly pick up the patient's wife, when it was appreciated by ED staff and the patient's son, that the patient was in an altered mental state, thus the EDP team intervened to address the patient's altered mental status, who upon EDP examination, consulted psychiatry for specialty evaluation and recommendations.   Continued Clinical Symptoms:  Alcohol Use Disorder Identification Test Final Score (AUDIT): 0 The Alcohol Use Disorders Identification Test, Guidelines for Use in Primary Care, Second Edition.  World Science Writer [facility]). Score between 0-7:  no or low risk or alcohol related problems. Score between 8-15:  moderate risk of alcohol related problems. Score between 16-19:  high risk of alcohol related problems. Score 20 or above:  warrants further diagnostic evaluation for alcohol dependence and treatment.   CLINICAL FACTORS:   Medical Diagnoses and  Treatments/Surgeries   Musculoskeletal: Strength & Muscle Tone: decreased Gait & Station: unsteady Patient leans: N/A  Psychiatric Specialty Exam:  Presentation  General Appearance:  Bizarre  Eye Contact: Fleeting  Speech: Slow  Speech Volume: Decreased  Handedness: Right   Mood and Affect  Mood: Anxious  Affect: Appropriate   Thought Process  Thought Processes: Disorganized  Descriptions of Associations:Intact  Orientation:Partial  Thought Content:Illogical; Rumination  History of Schizophrenia/Schizoaffective disorder:No data recorded Duration of Psychotic Symptoms:No data recorded Hallucinations:Hallucinations: None  Ideas of Reference:Delusions  Suicidal Thoughts:Suicidal Thoughts: No  Homicidal Thoughts:Homicidal Thoughts: No   Sensorium  Memory: Immediate Fair; Remote Poor; Recent Poor  Judgment: Impaired  Insight: Shallow   Executive Functions  Concentration: Poor  Attention Span: Poor  Recall: Poor  Fund of Knowledge: Poor  Language: Fair   Psychomotor Activity  Psychomotor Activity: Psychomotor Activity: Normal   Assets  Assets: Communication Skills; Desire for Improvement; Physical Health   Sleep  Sleep: Sleep: Fair    Physical Exam: Physical Exam ROS Blood pressure (!) 149/83, pulse 71, temperature (!) 97.5 F (36.4 C), resp. rate 18, height 5' 1 (1.549 m), weight 67.1 kg, SpO2 98%. Body mass index is 27.96 kg/m.   COGNITIVE FEATURES THAT CONTRIBUTE TO RISK:  None    SUICIDE RISK:   Minimal: No identifiable suicidal ideation.  Patients presenting with no risk factors but with morbid ruminations; may be classified as minimal risk based on the severity of the depressive symptoms  PLAN OF CARE: Patient is admitted to [facility] psych unit with Q15 min safety monitoring. Multidisciplinary team approach is offered. Medication management; group/milieu therapy is offered.   I certify that inpatient  services furnished can reasonably be expected to improve the patient's condition.   Allyn Foil, MD 08/06/2024, 1:33 PM

## 2024-08-07 LAB — GLUCOSE, CAPILLARY: Glucose-Capillary: 144 mg/dL — ABNORMAL HIGH (ref 70–99)

## 2024-08-07 MED ORDER — ARIPIPRAZOLE 5 MG PO TABS
5.0000 mg | ORAL_TABLET | Freq: Every day | ORAL | Status: DC
  Administered 2024-08-07 – 2024-08-08 (×2): 5 mg via ORAL
  Filled 2024-08-07 (×2): qty 1

## 2024-08-07 NOTE — Plan of Care (Signed)

## 2024-08-07 NOTE — Progress Notes (Addendum)
 Patient requested that he served breakfast in his room.  Patient was advised by [facility] that meals were to be eaten in the dayroom.  Patient then declined to eat breakfast.  Patient took all his scheduled medications when due.  States he needs to see the doctor and that he wants to go home.  Patient refused lunch but then was directed into a w/c and taken out to the lunch room and consumed all his lunch.  He stayed in the dayroom for about an hour afterwards and then wheeled himself back into his room and put himself back into bed.  Patient joined his peers outside for group, using a walker to ambulate for a short time then ambulated back to his room.  Joined the group for dinner without coaxing. Will continue to monitor.

## 2024-08-07 NOTE — Progress Notes (Signed)
 [facility] MD Progress Note  08/07/2024 1:38 PM [name]  MRN:  [id number]  [name] is a 83 y.o. Caucasian male with a past psychiatric history of bipolar disorder unspecified, with pertinent medical comorbidities/history that include biventricular heart failure, transcatheter aortic valve replacement, CAD, CKD stage IIIa, CHB, essential hypertension, hyperlipidemia, BPH, sleep apnea, type 2 diabetes, GERD, and acquired hypothyroidism, who presented this encounter by way of self with with the patient's son to reportedly pick up the patient's wife, when it was appreciated by ED staff and the patient's son, that the patient was in an altered mental state, thus the EDP team intervened to address the patient's altered mental status, who upon EDP examination, consulted psychiatry for specialty evaluation and recommendations. Patient is admitted to [facility] unit with Q15 min safety monitoring. Multidisciplinary team approach is offered. Medication management; group/milieu therapy is offered.  Subjective:  Chart reviewed, case discussed in multidisciplinary meeting, patient seen during rounds.   Patient is noted to be sitting in his bed.  When asked about his meals he reports nobody is bringing the food to his room.  Patient was educated that meals are served in the dining area.  Patient denies feeling sad or worried.  Patient denies SI/HI/plan and denies auditory/visual hallucinations.  Per nursing patient was brought to the day area and he is able to have his meals.  He also was able to take his morning medications. Sleep: Fair  Appetite:  Fair  Past Psychiatric History: see h&P Family History:  Family History  Problem Relation Age of Onset   CAD Father    Diabetes Mellitus II Maternal Grandmother    Social History:  Social History   Substance and Sexual Activity  Alcohol Use Yes     Social History   Substance and Sexual Activity  Drug Use Never    Social History   Socioeconomic History    Marital status: Married    Spouse name: Not on file   Number of children: Not on file   Years of education: Not on file   Highest education level: Not on file  Occupational History   Not on file  Tobacco Use   Smoking status: Former    Types: Cigarettes   Smokeless tobacco: Never  Substance and Sexual Activity   Alcohol use: Yes   Drug use: Never   Sexual activity: Not on file  Other Topics Concern   Not on file  Social History Narrative   Not on file   Social Drivers of Health   Financial Resource Strain: Not on file  Food Insecurity: Patient Unable To Answer (08/05/2024)   Hunger Vital Sign    Worried About Running Out of Food in the Last Year: Patient unable to answer    Ran Out of Food in the Last Year: Patient unable to answer  Recent Concern: Food Insecurity - Food Insecurity Present (07/27/2024)   Hunger Vital Sign    Worried About Running Out of Food in the Last Year: Sometimes true    Ran Out of Food in the Last Year: Never true  Transportation Needs: Patient Unable To Answer (08/05/2024)   PRAPARE - Transportation    Lack of Transportation (Medical): Patient unable to answer    Lack of Transportation (Non-Medical): Patient unable to answer  Physical Activity: Not on file  Stress: Not on file  Social Connections: Unknown (08/05/2024)   Social Connection and Isolation Panel    Frequency of Communication with Friends and Family: Patient unable to  answer    Frequency of Social Gatherings with Friends and Family: Patient unable to answer    Attends Religious Services: Patient unable to answer    Active Member of Clubs or Organizations: Patient unable to answer    Attends Banker Meetings: Patient unable to answer    Marital Status: Married   Past Medical History:  Past Medical History:  Diagnosis Date   Aortic stenosis, severe    Diabetes mellitus without complication (HCC)    Hypertension    Hypothyroidism    S/P TAVR (transcatheter aortic  valve replacement) 07/26/2024   s/p VIV TAVR with a 26 mm Edwards Sapien 3 Ultra Resilia THV via the TF approach by Dr. Verlin and Dr. Shyrl   Sleep apnea     Past Surgical History:  Procedure Laterality Date   APPENDECTOMY     INTRAOPERATIVE TRANSTHORACIC ECHOCARDIOGRAM N/A 07/26/2024   Procedure: ECHOCARDIOGRAM, TRANSTHORACIC;  Surgeon: Verlin Lonni BIRCH, MD;  Location: [facility] INVASIVE CV LAB;  Service: Cardiovascular;  Laterality: N/A;   PACEMAKER IMPLANT N/A 07/28/2024   Procedure: PACEMAKER IMPLANT;  Surgeon: Inocencio Soyla Lunger, MD;  Location: [facility] INVASIVE CV LAB;  Service: Cardiovascular;  Laterality: N/A;   RIGHT HEART CATH AND CORONARY ANGIOGRAPHY N/A 07/18/2024   Procedure: RIGHT HEART CATH AND CORONARY ANGIOGRAPHY;  Surgeon: Zenaida Morene PARAS, MD;  Location: [facility] INVASIVE CV LAB;  Service: Cardiovascular;  Laterality: N/A;    Current Medications: Current Facility-Administered Medications  Medication Dose Route Frequency Provider Last Rate Last Admin   acetaminophen  (TYLENOL ) tablet 650 mg  650 mg Oral Q6H PRN Mannie Jerel PARAS, NP       alum & mag hydroxide-simeth (MAALOX/MYLANTA) 200-200-20 MG/5ML suspension 30 mL  30 mL Oral Q4H PRN Mannie Jerel PARAS, NP       ARIPiprazole (ABILIFY) tablet 5 mg  5 mg Oral Daily Damontay Alred, MD   5 mg at 08/07/24 0915   aspirin chewable tablet 81 mg  81 mg Oral Daily Mannie Jerel PARAS, NP   81 mg at 08/07/24 0915   atorvastatin (LIPITOR) tablet 80 mg  80 mg Oral Daily Mannie Jerel PARAS, NP   80 mg at 08/07/24 0915   dapagliflozin propanediol (FARXIGA) tablet 10 mg  10 mg Oral Daily Mannie Jerel PARAS, NP   10 mg at 08/07/24 9085   furosemide (LASIX) tablet 20 mg  20 mg Oral Daily Mannie Jerel PARAS, NP   20 mg at 08/07/24 0915   levothyroxine (SYNTHROID) tablet 75 mcg  75 mcg Oral QAC breakfast Mannie Jerel PARAS, NP   75 mcg at 08/07/24 0631   magnesium hydroxide (MILK OF MAGNESIA) suspension 30 mL  30 mL Oral Daily PRN Mannie Jerel PARAS, NP        OLANZapine (ZYPREXA) injection 5 mg  5 mg Intramuscular TID PRN Mannie Jerel PARAS, NP       OLANZapine zydis (ZYPREXA) disintegrating tablet 5 mg  5 mg Oral TID PRN Mannie Jerel PARAS, NP        Lab Results:  Results for orders placed or performed during the hospital encounter of 08/05/24 (from the past 48 hours)  Hemoglobin A1c     Status: Abnormal   Collection Time: 08/06/24  7:59 AM  Result Value Ref Range   Hgb A1c MFr Bld 6.0 (H) 4.8 - 5.6 %    Comment: (NOTE) Diagnosis of Diabetes The following HbA1c ranges recommended by the American Diabetes Association (ADA) may be used as an aid in the diagnosis  of diabetes mellitus.  Hemoglobin             Suggested A1C NGSP%              Diagnosis  <5.7                   Non Diabetic  5.7-6.4                Pre-Diabetic  >6.4                   Diabetic  <7.0                   Glycemic control for                       adults with diabetes.     Mean Plasma Glucose 125.5 mg/dL    Comment: Performed at [facility], [address], [place], [place] [address]  Lipid panel     Status: None   Collection Time: 08/06/24  7:59 AM  Result Value Ref Range   Cholesterol 144 0 - 200 mg/dL   Triglycerides 57 <849 mg/dL   HDL 44 >59 mg/dL   Total CHOL/HDL Ratio 3.3 RATIO   VLDL 11 0 - 40 mg/dL   LDL Cholesterol 89 0 - 99 mg/dL    Comment:        Total Cholesterol/HDL:CHD Risk Coronary Heart Disease Risk Table                     Men   Women  1/2 Average Risk   3.4   3.3  Average Risk       5.0   4.4  2 X Average Risk   9.6   7.1  3 X Average Risk  23.4   11.0        Use the calculated Patient Ratio above and the CHD Risk Table to determine the patient's CHD Risk.        ATP III CLASSIFICATION (LDL):  <100     mg/dL   Optimal  899-870  mg/dL   Near or Above                    Optimal  130-159  mg/dL   Borderline  839-810  mg/dL   High  >809     mg/dL   Very High Performed at [facility], [address]  [place], [place] [address]   Glucose, capillary     Status: None   Collection Time: 08/06/24 11:43 AM  Result Value Ref Range   Glucose-Capillary 86 70 - 99 mg/dL    Comment: Glucose reference range applies only to samples taken after fasting for at least 8 hours.  Glucose, capillary     Status: None   Collection Time: 08/06/24  4:36 PM  Result Value Ref Range   Glucose-Capillary 96 70 - 99 mg/dL    Comment: Glucose reference range applies only to samples taken after fasting for at least 8 hours.  Glucose, capillary     Status: None   Collection Time: 08/06/24  8:54 PM  Result Value Ref Range   Glucose-Capillary 98 70 - 99 mg/dL    Comment: Glucose reference range applies only to samples taken after fasting for at least 8 hours.    Blood Alcohol level:  Lab Results  Component Value Date   [facility] <15 08/04/2024  Metabolic Disorder Labs: Lab Results  Component Value Date   HGBA1C 6.0 (H) 08/06/2024   MPG 125.5 08/06/2024   MPG 131.24 07/16/2024   No results found for: PROLACTIN Lab Results  Component Value Date   CHOL 144 08/06/2024   TRIG 57 08/06/2024   HDL 44 08/06/2024   CHOLHDL 3.3 08/06/2024   VLDL 11 08/06/2024   LDLCALC 89 08/06/2024   LDLCALC 77 07/14/2024    Physical Findings: AIMS:  , ,  ,  ,    CIWA:    COWS:      Psychiatric Specialty Exam:  Presentation  General Appearance:  Bizarre  Eye Contact: Fleeting  Speech: Slow  Speech Volume: Decreased    Mood and Affect  Mood: Anxious  Affect: Appropriate   Thought Process  Thought Processes: Disorganized  Orientation:Partial  Thought Content:Illogical; Rumination  Hallucinations:Hallucinations: None  Ideas of Reference:Delusions  Suicidal Thoughts:Suicidal Thoughts: No  Homicidal Thoughts:Homicidal Thoughts: No   Sensorium  Memory: Immediate Fair; Remote Poor; Recent Poor  Judgment: Impaired  Insight: Shallow   Executive Functions   Concentration: Poor  Attention Span: Poor  Recall: Poor  Fund of Knowledge: Poor  Language: Fair   Psychomotor Activity  Psychomotor Activity: Psychomotor Activity: Normal  Musculoskeletal: Strength & Muscle Tone: decreased Gait & Station: unsteady Assets  Assets: Manufacturing Systems Engineer; Desire for Improvement; Physical Health    Physical Exam: Physical Exam Vitals and nursing note reviewed.    ROS Blood pressure (!) 108/58, pulse 65, temperature 97.6 F (36.4 C), resp. rate 18, height 5' 1 (1.549 m), weight 67.1 kg, SpO2 100%. Body mass index is 27.96 kg/m.  Diagnosis: Principal Problem:   Psychosis, unspecified psychosis type [facility])  Clinical Decision Making: Patient with remote history of bipolar disorder, complex medical history of cardiac issues and recent cardiac surgery involving pacemaker, brought in for increasing confusion and aggression.  Patient is admitted to inpatient psychiatric facility for managing the behaviors  Treatment Plan Summary:  Safety and Monitoring:            Involuntary admission to inpatient psychiatric unit for safety, stabilization and treatment             -- Daily contact with patient to assess and evaluate symptoms and progress in treatment             -- Patient's case to be discussed in multi-disciplinary team meeting             -- Observation Level: q15 minute checks             -- Vital signs:  q12 hours             -- Precautions: suicide, elopement, and assault   2. Psychiatric Diagnoses and Treatment:              EKG reflects A-fib and prolonged QTc-Will switch Risperdal to Abilify.     -- The risks/benefits/side-effects/alternatives to this medication were discussed in detail with the patient and time was given for questions. The patient consents to medication trial.                -- Metabolic profile and EKG monitoring obtained while on an atypical antipsychotic (BMI: Lipid Panel: HbgA1c: QTc:)               -- Encouraged patient to participate in unit milieu and in scheduled group therapies  3. Medical Issues Being Addressed:   4. Discharge Planning:   -- Social work and case management to assist with discharge planning and identification of hospital follow-up needs prior to discharge  -- Estimated LOS: 3-4 days  Allyn Foil, MD 08/07/2024, 1:38 PM

## 2024-08-07 NOTE — Plan of Care (Signed)
 [name] is a 83 y.o. male patient. No diagnosis found. 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 Verlin and Dr. Shyrl   Sleep apnea    Current Facility-Administered Medications  Medication Dose Route Frequency Provider Last Rate Last Admin   acetaminophen  (TYLENOL ) tablet 650 mg  650 mg Oral Q6H PRN Mannie Jerel PARAS, NP       alum & mag hydroxide-simeth (MAALOX/MYLANTA) 200-200-20 MG/5ML suspension 30 mL  30 mL Oral Q4H PRN Mannie Jerel PARAS, NP       aspirin chewable tablet 81 mg  81 mg Oral Daily Mannie Jerel PARAS, NP       atorvastatin (LIPITOR) tablet 80 mg  80 mg Oral Daily Mannie Jerel PARAS, NP       dapagliflozin propanediol (FARXIGA) tablet 10 mg  10 mg Oral Daily Mannie Jerel PARAS, NP       furosemide (LASIX) tablet 20 mg  20 mg Oral Daily Mannie Jerel PARAS, NP       levothyroxine (SYNTHROID) tablet 75 mcg  75 mcg Oral QAC breakfast Mannie Jerel PARAS, NP       magnesium hydroxide (MILK OF MAGNESIA) suspension 30 mL  30 mL Oral Daily PRN Mannie Jerel PARAS, NP       OLANZapine (ZYPREXA) injection 5 mg  5 mg Intramuscular TID PRN Mannie Jerel PARAS, NP       OLANZapine zydis (ZYPREXA) disintegrating tablet 5 mg  5 mg Oral TID PRN Mannie Jerel PARAS, NP       risperiDONE (RISPERDAL) tablet 0.25 mg  0.25 mg Oral BID Mannie Jerel PARAS, NP   0.25 mg at 08/05/24 2141   No Known Allergies Principal Problem:   Psychosis, unspecified psychosis type (HCC)  Blood pressure (!) 141/68, pulse (!) 38, temperature (!) 97.2 F (36.2 C), resp. rate 18, height 5' 1 (1.549 m), weight 67.1 kg, SpO2 100%.   Assessment & Plan: Patient denied pain, SI/HI, anxiety and depression this shift. Patient was monitored for fall this shift. Patient refused  his 2200 meds on 08/06/2024.  [name] [name] 08/07/2024

## 2024-08-07 NOTE — Group Note (Signed)
 LCSW Group Therapy Note  Group Date: 08/07/2024 Start Time: 1405 End Time: 1446   Type of Therapy and Topic:  Group Therapy - Healthy vs Unhealthy Coping Skills  Participation Level:  Did Not Attend   Description of Group The focus of this group was to determine what unhealthy coping techniques typically are used by group members and what healthy coping techniques would be helpful in coping with various problems. Patients were guided in becoming aware of the differences between healthy and unhealthy coping techniques. Patients were asked to identify 2-3 healthy coping skills they would like to learn to use more effectively.  Therapeutic Goals Patients learned that coping is what human beings do all day long to deal with various situations in their lives Patients defined and discussed healthy vs unhealthy coping techniques Patients identified their preferred coping techniques and identified whether these were healthy or unhealthy Patients determined 2-3 healthy coping skills they would like to become more familiar with and use more often. Patients provided support and ideas to each other   Summary of Patient Progress:  The patient did not attend group.   Therapeutic Modalities Cognitive Behavioral Therapy   Earnestine Tuohey S Kinta Martis, LCSWA 08/07/2024  3:31 PM

## 2024-08-07 NOTE — Group Note (Signed)
 Date:  08/07/2024 Time:  9:12 PM  Group Topic/Focus:  Wrap-Up Group:   The focus of this group is to help patients review their daily goal of treatment and discuss progress on daily workbooks.    Participation Level:  Active  Participation Quality:  Appropriate  Affect:  Appropriate  Cognitive:  Alert  Insight: Good  Engagement in Group:  Engaged  Modes of Intervention:  Discussion  Additional Comments:    [name] [name] 08/07/2024, 9:12 PM

## 2024-08-07 NOTE — Group Note (Signed)
 Date:  08/07/2024 Time:  5:13 PM  Group Topic/Focus:    There is an intrinsic connection between music and nature, and when we tune in, we can find rhythms, melodies and inspiration all around us . [name] has inspired cultures and musicians for centuries, shaping everything from traditional folk songs to ppl corporation. We present a view that places our ability to create and appreciate music at the center of what it means to be human. We argue that music is the sounds of human bodies, voices and minds - our personalities - moving in creative, story-making ways.  Participation Level:  Minimal  Participation Quality:  Appropriate  Affect:  Appropriate  Cognitive:  Appropriate  Insight: Good  Engagement in Group:  Distracting  Modes of Intervention:  Socialization  Additional Comments:  N/A  [name] Gavel 08/07/2024, 5:13 PM

## 2024-08-07 NOTE — BHH Counselor (Signed)
 The LCSWA attempted o complete the PSA with the patient but patient was disorganized and the LCSWA was unable to access. The PSA will be attempted at a later time.

## 2024-08-07 NOTE — Progress Notes (Signed)
   08/07/24 2100  Psych Admission Type (Psych Patients Only)  Admission Status Involuntary  Psychosocial Assessment  Patient Complaints None  Eye Contact Brief  Facial Expression Flat  Affect Flat  Speech Argumentative;Soft  Interaction Minimal  Motor Activity Slow  Appearance/Hygiene Unremarkable  Behavior Characteristics Calm;Cooperative  Mood Pleasant  Thought Process  Coherency WDL  Content WDL  Delusions None reported or observed  Perception WDL  Hallucination None reported or observed  Judgment Impaired  Confusion Mild  Danger to Self  Current suicidal ideation? Denies  Danger to Others  Danger to Others None reported or observed   Mood/Behavior:  Pleasant and cooperative.    Psych assessment: Denies SI/HI and AVH.     Interaction / Group attendance:  Isolative in room. Minimal interaction with and staff.  Pt showered overnight.    Medication/ PRNs: No prns required.   Pain: Denies   15 min checks in place for safety

## 2024-08-07 NOTE — Group Note (Signed)
 Date:  08/07/2024 Time:  10:29 AM  Group Topic/Focus:  Movement Therapy    Participation Level:  Did Not Attend    Norleen SHAUNNA Bias 08/07/2024, 10:29 AM

## 2024-08-08 NOTE — Group Note (Signed)
 Date:  08/08/2024 Time:  10:48 AM  Group Topic/Focus:  Goals Group:   The focus of this group is to help patients establish daily goals to achieve during treatment and discuss how the patient can incorporate goal setting into their daily lives to aide in recovery.    Participation Level:  Active  Participation Quality:  Attentive  Affect:  Appropriate  Cognitive:  Appropriate  Insight: Appropriate  Engagement in Group:  Engaged  Modes of Intervention:  Discussion  Additional Comments:    [name] 08/08/2024, 10:48 AM

## 2024-08-08 NOTE — Progress Notes (Signed)
  [facility] Adult Case Management Discharge Plan :  Will you be returning to the same living situation after discharge:  Yes,  pt will return home  At discharge, do you have transportation home?: Yes,  pt's son will pick him up  Do you have the ability to pay for your medications: Yes,  HU[employer] MEDICARE / HUMANA MEDICARE HMO  Release of information consent forms completed and in the chart;  Patient's signature needed at discharge.  Patient to Follow up at:  Follow-up Information     [facility] Follow up.   Why: Although you declined scheduled follow-up, [facility]'s [place] office accepts walk-in appointments Monday through Friday, but they stop accepting the last walk-in at 3 p.m. The office is open from 8 a.m. to 5 p.m., with a break for lunch from 12 p.m. to 1 p.m. Contact information: [address]  [address] [place] [address] [phone]                 Next level of care provider has access to [facility] Link:no  Safety Planning and Suicide Prevention discussed: Yes,  SPE gone over with son      Has patient been referred to the Quitline?: Patient does not use tobacco/nicotine products  Patient has been referred for addiction treatment: No known substance use disorder.  [address] LCSWA 08/08/2024, 11:54 AM

## 2024-08-08 NOTE — Discharge Summary (Signed)
 Physician Discharge Summary Note  Patient:  [name] is an 83 y.o., male MRN:  [id number] DOB:  October 22, 1940 Patient phone:  [phone] (home)  Patient address:   [address] [place] [address],   Total time spent: 40 min Date of Admission:  08/05/2024 Date of Discharge: 08/08/24  Reason for Admission:  [name] is a 83 y.o. Caucasian male with a past psychiatric history of bipolar disorder unspecified, with pertinent medical comorbidities/history that include biventricular heart failure, transcatheter aortic valve replacement, CAD, CKD stage IIIa, CHB, essential hypertension, hyperlipidemia, BPH, sleep apnea, type 2 diabetes, GERD, and acquired hypothyroidism, who presented this encounter by way of self with with the patient's son to reportedly pick up the patient's wife, when it was appreciated by ED staff and the patient's son, that the patient was in an altered mental state, thus the EDP team intervened to address the patient's altered mental status, who upon EDP examination, consulted psychiatry for specialty evaluation and recommendations. Patient is admitted to [facility] unit with Q15 min safety monitoring. Multidisciplinary team approach is offered. Medication management; group/milieu therapy is offered.   Principal Problem: Psychosis, unspecified psychosis type (HCC) Discharge Diagnoses: Principal Problem:   Psychosis, unspecified psychosis type (HCC) R/O Dementia with behavioral disturbance  Past Psychiatric History: see h&p  Family Psychiatric  History: see h&p Social History:  Social History   Substance and Sexual Activity  Alcohol Use Yes     Social History   Substance and Sexual Activity  Drug Use Never    Social History   Socioeconomic History   Marital status: Married    Spouse name: Not on file   Number of children: Not on file   Years of education: Not on file   Highest education level: Not on file  Occupational History   Not on file   Tobacco Use   Smoking status: Former    Types: Cigarettes   Smokeless tobacco: Never  Substance and Sexual Activity   Alcohol use: Yes   Drug use: Never   Sexual activity: Not on file  Other Topics Concern   Not on file  Social History Narrative   Not on file   Social Drivers of Health   Financial Resource Strain: Not on file  Food Insecurity: Patient Unable To Answer (08/05/2024)   Hunger Vital Sign    Worried About Running Out of Food in the Last Year: Patient unable to answer    Ran Out of Food in the Last Year: Patient unable to answer  Recent Concern: Food Insecurity - Food Insecurity Present (07/27/2024)   Hunger Vital Sign    Worried About Running Out of Food in the Last Year: Sometimes true    Ran Out of Food in the Last Year: Never true  Transportation Needs: Patient Unable To Answer (08/05/2024)   PRAPARE - Transportation    Lack of Transportation (Medical): Patient unable to answer    Lack of Transportation (Non-Medical): Patient unable to answer  Physical Activity: Not on file  Stress: Not on file  Social Connections: Unknown (08/05/2024)   Social Connection and Isolation Panel    Frequency of Communication with Friends and Family: Patient unable to answer    Frequency of Social Gatherings with Friends and Family: Patient unable to answer    Attends Religious Services: Patient unable to answer    Active Member of Clubs or Organizations: Patient unable to answer    Attends Banker Meetings: Patient unable to answer  Marital Status: Married   Past Medical History:  Past Medical History:  Diagnosis Date   Aortic stenosis, severe    Diabetes mellitus without complication (HCC)    Hypertension    Hypothyroidism    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 Verlin and Dr. Shyrl   Sleep apnea     Past Surgical History:  Procedure  Laterality Date   APPENDECTOMY     INTRAOPERATIVE TRANSTHORACIC ECHOCARDIOGRAM N/A 07/26/2024   Procedure: ECHOCARDIOGRAM, TRANSTHORACIC;  Surgeon: Verlin Lonni BIRCH, MD;  Location: [facility] INVASIVE CV LAB;  Service: Cardiovascular;  Laterality: N/A;   PACEMAKER IMPLANT N/A 07/28/2024   Procedure: PACEMAKER IMPLANT;  Surgeon: Inocencio Soyla Lunger, MD;  Location: [facility] INVASIVE CV LAB;  Service: Cardiovascular;  Laterality: N/A;   RIGHT HEART CATH AND CORONARY ANGIOGRAPHY N/A 07/18/2024   Procedure: RIGHT HEART CATH AND CORONARY ANGIOGRAPHY;  Surgeon: Zenaida Morene PARAS, MD;  Location: [facility] INVASIVE CV LAB;  Service: Cardiovascular;  Laterality: N/A;   Family History:  Family History  Problem Relation Age of Onset   CAD Father    Diabetes Mellitus II Maternal Grandmother     Hospital Course:  [name] is a 83 y.o. Caucasian male with a past psychiatric history of bipolar disorder unspecified, with pertinent medical comorbidities/history that include biventricular heart failure, transcatheter aortic valve replacement, CAD, CKD stage IIIa, CHB, essential hypertension, hyperlipidemia, BPH, sleep apnea, type 2 diabetes, GERD, and acquired hypothyroidism, who presented this encounter by way of self with with the patient's son to reportedly pick up the patient's wife, when it was appreciated by ED staff and the patient's son, that the patient was in an altered mental state, thus the EDP team intervened to address the patient's altered mental status, who upon EDP examination, consulted psychiatry for specialty evaluation and recommendations. Patient is admitted to [facility] unit with Q15 min safety monitoring. Multidisciplinary team approach is offered. Medication management; group/milieu therapy is offered.  Detailed risk assessment is complete based on clinical exam and individual risk factors and acute suicide risk is low and acute violence risk is low.    On admission patient is noted to have  significant amount of medical problems and is noted to be confused.  Patient was started on Risperdal 0.5 mg twice daily to help with religious delusions.  He given the fact that the latest EKG shows A-fib and QTc prolongation of more than 500 Risperdal was switched to Abilify for cardiac safety and to help with the religious delusions.  Provider reached out to patient's son who is legal next of kin as he informed that patient's wife is also very limited in decision making.  Patient's son was very upset about patient with so many cardiac issues and medical problems being forced to be admitted on the psychiatric unit.  He did not give consent for psychotropic medications especially Abilify stating he had bad reaction to Abilify personally and he does not want his father to be taking Abilify.  Provider explained the cardiac risk factors with other antipsychotics.  Patient's son is not agreeable at this time for any other alternate treatment and was very hostile towards the provider and social worker stating that admission does not make sense and that his father should not be in a psychiatric hospital.  After extensive discussion decision to discharge patient home to his family with outpatient mental health resources  were made.    Patient displayed safe behaviors and consistently denied SI/HI and denied hallucinations.  At the time of discharge patient was confused at baseline but is able to recognize his son.  Patient did require a lot of redirection and transition from unit to discharge with his son.  Currently, all modifiable risk of harm to self/harm to others have been addressed and patient is no longer appropriate for the acute inpatient setting and is able to continue treatment for mental health needs in the community with the supports as indicated below.  Patient is educated and verbalized understanding of discharge plan of care including medications, follow-up appointments, mental health resources and  further crisis services in the community.  He is instructed to call 911 or present to the nearest emergency room should he experience any decompensation in mood, disturbance of bowel or return of suicidal/homicidal ideations.  Patient verbalizes understanding of this education and agrees to this plan of care  Physical Findings: AIMS:  , ,  ,  ,    CIWA:    COWS:        Psychiatric Specialty Exam:  Presentation  General Appearance:  Appropriate for Environment  Eye Contact: Fair  Speech: Normal Rate  Speech Volume: Decreased    Mood and Affect  Mood: Euthymic  Affect: Appropriate   Thought Process  Thought Processes: Irrevelant  Descriptions of Associations:Intact  Orientation:Partial  Thought Content:Illogical  Hallucinations:Hallucinations: None  Ideas of Reference:None  Suicidal Thoughts:Suicidal Thoughts: No  Homicidal Thoughts:Homicidal Thoughts: No   Sensorium  Memory: Immediate Fair; Remote Poor; Recent Poor  Judgment: Fair  Insight: Fair   Art Therapist  Concentration: Fair  Attention Span: Fair  Recall: [employer] of Knowledge: Fair  Language: Fair   Psychomotor Activity  Psychomotor Activity: Psychomotor Activity: Normal  Musculoskeletal: Strength & Muscle Tone: within normal limits Gait & Station: normal Assets  Assets: Manufacturing Systems Engineer; Desire for Improvement   Sleep  Sleep: Sleep: Fair    Physical Exam: Physical Exam Vitals and nursing note reviewed.    ROS Blood pressure (!) 103/49, pulse 84, temperature 97.7 F (36.5 C), resp. rate 16, height 5' 1 (1.549 m), weight 67.1 kg, SpO2 97%. Body mass index is 27.96 kg/m.   Social History   Tobacco Use  Smoking Status Former   Types: Cigarettes  Smokeless Tobacco Never   Tobacco Cessation:  A prescription for an FDA-approved tobacco cessation medication was offered at discharge and the patient refused   Blood Alcohol level:  Lab  Results  Component Value Date   [facility] <15 08/04/2024    Metabolic Disorder Labs:  Lab Results  Component Value Date   HGBA1C 6.0 (H) 08/06/2024   MPG 125.5 08/06/2024   MPG 131.24 07/16/2024   No results found for: PROLACTIN Lab Results  Component Value Date   CHOL 144 08/06/2024   TRIG 57 08/06/2024   HDL 44 08/06/2024   CHOLHDL 3.3 08/06/2024   VLDL 11 08/06/2024   LDLCALC 89 08/06/2024   LDLCALC 77 07/14/2024    See Psychiatric Specialty Exam and Suicide Risk Assessment completed by Attending Physician prior to discharge.  Discharge destination:  Home  Is patient on multiple antipsychotic therapies at discharge:  No   Has Patient had three or more failed trials of antipsychotic monotherapy by history:  No  Recommended Plan for Multiple Antipsychotic Therapies: NA   Allergies as of 08/08/2024   No Known Allergies      Medication List  STOP taking these medications    carboxymethylcellulose 0.5 % Soln Commonly known as: REFRESH PLUS   docusate sodium  100 MG capsule Commonly known as: COLACE   fluticasone 50 MCG/ACT nasal spray Commonly known as: FLONASE   polyethylene glycol 17 g packet Commonly known as: MIRALAX  / GLYCOLAX        TAKE these medications      Indication  aspirin 81 MG chewable tablet Chew 1 tablet (81 mg total) by mouth daily.    atorvastatin 80 MG tablet Commonly known as: LIPITOR Take 1 tablet (80 mg total) by mouth daily.    dapagliflozin propanediol 10 MG Tabs tablet Commonly known as: FARXIGA Take 1 tablet (10 mg total) by mouth daily.    furosemide 20 MG tablet Commonly known as: LASIX Take 1 tablet (20 mg total) by mouth daily.    levothyroxine 75 MCG tablet Commonly known as: SYNTHROID Take 1 tablet (75 mcg total) by mouth daily before breakfast.         Follow-up Information     [facility] Follow up.   Why: Although you declined scheduled follow-up, [facility] [place] office accepts walk-in appointments  Monday through Friday, but they stop accepting the last walk-in at 3 p.m. The office is open from 8 a.m. to 5 p.m., with a break for lunch from 12 p.m. to 1 p.m. Contact information: [address]  [address] [place] [address] [phone]                 Follow-up recommendations:  Activity:  as tolerated    Signed: Searcy Miyoshi, MD 08/08/2024, 12:08 PM

## 2024-08-08 NOTE — [facility] Suicide Risk Assessment (Signed)
 [facility] INPATIENT:  Family/Significant Other Suicide Prevention Education  Suicide Prevention Education:  Education Completed; [name], son, [phone],  (name of family member/significant other) has been identified by the patient as the family member/significant other with whom the patient will be residing, and identified as the person(s) who will aid the patient in the event of a mental health crisis (suicidal ideations/suicide attempt).  With written consent from the patient, the family member/significant other has been provided the following suicide prevention education, prior to the and/or following the discharge of the patient.  The suicide prevention education provided includes the following: Suicide risk factors Suicide prevention and interventions National Suicide Hotline telephone number [facility] assessment telephone number [facility] Emergency Assistance 911 [facility] Emergency Dept and/or Residential Mobile Crisis Unit telephone number  Request made of family/significant other to: Remove weapons (e.g., guns, rifles, knives), all items previously/currently identified as safety concern.   Remove drugs/medications (over-the-counter, prescriptions, illicit drugs), all items previously/currently identified as a safety concern.  The family member/significant other verbalizes understanding of the suicide prevention education information provided.  The family member/significant other agrees to remove the items of safety concern listed above.  [name] 08/08/2024, 11:55 AM

## 2024-08-08 NOTE — BH IP Treatment Plan (Signed)
 Interdisciplinary Treatment and Diagnostic Plan Update  08/08/2024 Time of Session: 10:08 AM  [name] MRN: [id number]  Principal Diagnosis: Psychosis, unspecified psychosis type [facility])  Secondary Diagnoses: Principal Problem:   Psychosis, unspecified psychosis type (HCC)   Current Medications:  Current Facility-Administered Medications  Medication Dose Route Frequency Provider Last Rate Last Admin   acetaminophen  (TYLENOL ) tablet 650 mg  650 mg Oral Q6H PRN Mannie Jerel PARAS, NP       alum & mag hydroxide-simeth (MAALOX/MYLANTA) 200-200-20 MG/5ML suspension 30 mL  30 mL Oral Q4H PRN Mannie Jerel PARAS, NP       ARIPiprazole (ABILIFY) tablet 5 mg  5 mg Oral Daily Jadapalle, Sree, MD   5 mg at 08/08/24 9150   aspirin chewable tablet 81 mg  81 mg Oral Daily Mannie Jerel PARAS, NP   81 mg at 08/08/24 0848   atorvastatin (LIPITOR) tablet 80 mg  80 mg Oral Daily Mannie Jerel PARAS, NP   80 mg at 08/08/24 0848   dapagliflozin propanediol (FARXIGA) tablet 10 mg  10 mg Oral Daily Mannie Jerel PARAS, NP   10 mg at 08/08/24 0847   furosemide (LASIX) tablet 20 mg  20 mg Oral Daily Mannie Jerel PARAS, NP   20 mg at 08/08/24 0847   levothyroxine (SYNTHROID) tablet 75 mcg  75 mcg Oral QAC breakfast Mannie Jerel PARAS, NP   75 mcg at 08/08/24 0641   magnesium hydroxide (MILK OF MAGNESIA) suspension 30 mL  30 mL Oral Daily PRN Mannie Jerel PARAS, NP       OLANZapine (ZYPREXA) injection 5 mg  5 mg Intramuscular TID PRN Mannie Jerel PARAS, NP       OLANZapine zydis (ZYPREXA) disintegrating tablet 5 mg  5 mg Oral TID PRN Mannie Jerel PARAS, NP       PTA Medications: Medications Prior to Admission  Medication Sig Dispense Refill Last Dose/Taking   aspirin 81 MG chewable tablet Chew 1 tablet (81 mg total) by mouth daily. 30 tablet 0    atorvastatin (LIPITOR) 80 MG tablet Take 1 tablet (80 mg total) by mouth daily. 30 tablet 1    carboxymethylcellulose (REFRESH PLUS) 0.5 % SOLN Place 1 drop into both eyes daily as needed  (dry eyes).      dapagliflozin propanediol (FARXIGA) 10 MG TABS tablet Take 1 tablet (10 mg total) by mouth daily. 30 tablet 1    docusate sodium  (COLACE) 100 MG capsule Take 200 mg by mouth 2 (two) times daily as needed for mild constipation. (Patient not taking: Reported on 08/03/2024)      fluticasone (FLONASE) 50 MCG/ACT nasal spray Place 2 sprays into both nostrils daily as needed for allergies. (Patient not taking: Reported on 08/03/2024)      furosemide (LASIX) 20 MG tablet Take 1 tablet (20 mg total) by mouth daily. 30 tablet 3    levothyroxine (SYNTHROID) 75 MCG tablet Take 1 tablet (75 mcg total) by mouth daily before breakfast. 30 tablet 1    polyethylene glycol (MIRALAX  / GLYCOLAX ) 17 g packet Take 17 g by mouth 2 (two) times daily as needed for mild constipation. (Patient not taking: Reported on 08/03/2024)       Patient Stressors: Health problems    Patient Strengths: Supportive family/friends   Treatment Modalities: Medication Management, Group therapy, Case management,  1 to 1 session with clinician, Psychoeducation, Recreational therapy.   Physician Treatment Plan for Primary Diagnosis: Psychosis, unspecified psychosis type (HCC) Long Term Goal(s): Improvement in symptoms so as ready  for discharge   Short Term Goals: Ability to identify changes in lifestyle to reduce recurrence of condition will improve Ability to verbalize feelings will improve Ability to disclose and discuss suicidal ideas Ability to demonstrate self-control will improve Ability to identify and develop effective coping behaviors will improve  Medication Management: Evaluate patient's response, side effects, and tolerance of medication regimen.  Therapeutic Interventions: 1 to 1 sessions, Unit Group sessions and Medication administration.  Evaluation of Outcomes: Progressing  Physician Treatment Plan for Secondary Diagnosis: Principal Problem:   Psychosis, unspecified psychosis type (HCC)  Long  Term Goal(s): Improvement in symptoms so as ready for discharge   Short Term Goals: Ability to identify changes in lifestyle to reduce recurrence of condition will improve Ability to verbalize feelings will improve Ability to disclose and discuss suicidal ideas Ability to demonstrate self-control will improve Ability to identify and develop effective coping behaviors will improve     Medication Management: Evaluate patient's response, side effects, and tolerance of medication regimen.  Therapeutic Interventions: 1 to 1 sessions, Unit Group sessions and Medication administration.  Evaluation of Outcomes: Progressing   RN Treatment Plan for Primary Diagnosis: Psychosis, unspecified psychosis type (HCC) Long Term Goal(s): Knowledge of disease and therapeutic regimen to maintain health will improve  Short Term Goals: Ability to remain free from injury will improve, Ability to verbalize frustration and anger appropriately will improve, Ability to demonstrate self-control, Ability to participate in decision making will improve, Ability to verbalize feelings will improve, Ability to disclose and discuss suicidal ideas, Ability to identify and develop effective coping behaviors will improve, and Compliance with prescribed medications will improve  Medication Management: RN will administer medications as ordered by provider, will assess and evaluate patient's response and provide education to patient for prescribed medication. RN will report any adverse and/or side effects to prescribing provider.  Therapeutic Interventions: 1 on 1 counseling sessions, Psychoeducation, Medication administration, Evaluate responses to treatment, Monitor vital signs and CBGs as ordered, Perform/monitor CIWA, COWS, AIMS and Fall Risk screenings as ordered, Perform wound care treatments as ordered.  Evaluation of Outcomes: Progressing   LCSW Treatment Plan for Primary Diagnosis: Psychosis, unspecified psychosis type  (HCC) Long Term Goal(s): Safe transition to appropriate next level of care at discharge, Engage patient in therapeutic group addressing interpersonal concerns.  Short Term Goals: Engage patient in aftercare planning with referrals and resources, Increase social support, Increase ability to appropriately verbalize feelings, Increase emotional regulation, Facilitate acceptance of mental health diagnosis and concerns, Facilitate patient progression through stages of change regarding substance use diagnoses and concerns, Identify triggers associated with mental health/substance abuse issues, and Increase skills for wellness and recovery  Therapeutic Interventions: Assess for all discharge needs, 1 to 1 time with Social worker, Explore available resources and support systems, Assess for adequacy in community support network, Educate family and significant other(s) on suicide prevention, Complete Psychosocial Assessment, Interpersonal group therapy.  Evaluation of Outcomes: Progressing   Progress in Treatment: Attending groups: Yes. and No. Participating in groups: Yes. and No. Taking medication as prescribed: Yes. Toleration medication: Yes. Family/Significant other contact made: No, will contact:  CSW will contact if given permission  Patient understands diagnosis: Yes. Discussing patient identified problems/goals with staff: Yes. Medical problems stabilized or resolved: Yes. Denies suicidal/homicidal ideation: Yes. Issues/concerns per patient self-inventory: No. Other: None   New problem(s) identified: No, Describe:  None identified   New Short Term/Long Term Goal(s): elimination of symptoms of psychosis, medication management for mood stabilization; elimination of SI thoughts;  development of comprehensive mental wellness plan.   Patient Goals:  To go home   Discharge Plan or Barriers: CSW will assist with appropriate discharge planning   Reason for Continuation of Hospitalization:  Depression Medication stabilization  Estimated Length of Stay: 1 to 7 days   Last 3 [place] Suicide Severity Risk Score: Flowsheet Row Admission (Current) from 08/05/2024 in [facility] BEHAVIORAL MEDICINE ED from 08/04/2024 in [facility] Department [facility] Admission (Discharged) from 07/26/2024 in [place] 2H CARDIOVASCULAR ICU  C-SSRS RISK CATEGORY No Risk No Risk No Risk    Last PHQ 2/9 Scores:     No data to display          Scribe for Treatment Team: Lum JONETTA Croft, LCSWA 08/08/2024 11:18 AM

## 2024-08-08 NOTE — BHH Suicide Risk Assessment (Signed)
 [facility] Discharge Suicide Risk Assessment   Principal Problem: Psychosis, unspecified psychosis type (HCC) Discharge Diagnoses: Principal Problem:   Psychosis, unspecified psychosis type (HCC)   Total Time spent with patient: 30 minutes  Musculoskeletal: Strength & Muscle Tone: within normal limits Gait & Station: normal Patient leans: N/A  Psychiatric Specialty Exam  Presentation  General Appearance:  Appropriate for Environment  Eye Contact: Fair  Speech: Normal Rate  Speech Volume: Decreased  Handedness: Right   Mood and Affect  Mood: Euthymic  Duration of Depression Symptoms: No data recorded Affect: Appropriate   Thought Process  Thought Processes: Irrevelant  Descriptions of Associations:Intact  Orientation:Partial  Thought Content:Illogical  History of Schizophrenia/Schizoaffective disorder:No data recorded Duration of Psychotic Symptoms:No data recorded Hallucinations:Hallucinations: None  Ideas of Reference:None  Suicidal Thoughts:Suicidal Thoughts: No  Homicidal Thoughts:Homicidal Thoughts: No   Sensorium  Memory: Immediate Fair; Remote Poor; Recent Poor  Judgment: Fair  Insight: Fair   Chartered Certified Accountant: Fair  Attention Span: Fair  Recall: [employer] of Knowledge: Fair  Language: Fair   Psychomotor Activity  Psychomotor Activity: Psychomotor Activity: Normal   Assets  Assets: Communication Skills; Desire for Improvement   Sleep  Sleep: Sleep: Fair  Estimated Sleeping Duration (Last 24 Hours): 5.50-7.00 hours  Physical Exam: Physical Exam ROS Blood pressure (!) 103/49, pulse 84, temperature 97.7 F (36.5 C), resp. rate 16, height 5' 1 (1.549 m), weight 67.1 kg, SpO2 97%. Body mass index is 27.96 kg/m.  Mental Status Per Nursing Assessment::   On Admission:  NA  Demographic Factors:  Male  Loss Factors: Decrease in vocational status  Historical  Factors: Impulsivity  Risk Reduction Factors:   Living with another person, especially a relative, Positive social support, Positive therapeutic relationship, and Positive coping skills or problem solving skills  Continued Clinical Symptoms:  Medical Diagnoses and Treatments/Surgeries  Cognitive Features That Contribute To Risk:  None    Suicide Risk:  Minimal: No identifiable suicidal ideation.  Patients presenting with no risk factors but with morbid ruminations; may be classified as minimal risk based on the severity of the depressive symptoms   Follow-up Information     [facility] Follow up.   Why: Although you declined scheduled follow-up, [facility] [place] office accepts walk-in appointments Monday through Friday, but they stop accepting the last walk-in at 3 p.m. The office is open from 8 a.m. to 5 p.m., with a break for lunch from 12 p.m. to 1 p.m. Contact information: [address]  [address] [place] [address] [phone]                 Plan Of Care/Follow-up recommendations:  Activity:  as tolerated  Allyn Foil, MD 08/08/2024, 12:08 PM

## 2024-08-08 NOTE — Care Management Important Message (Signed)
 Important Message  Patient Details  Name: [name] MRN: [id number] Date of Birth: 1941-04-04   Important Message Given:  Yes - Medicare IM  Pt given blank IM due to IM not being signed by pt at this time     Lum JONETTA Croft, LCSWA 08/08/2024, 11:57 AM

## 2024-08-08 NOTE — Plan of Care (Addendum)
 Patient is to be discharged.  Patient is changing into his clothes and son is coming to pick him up.  Patient became upset after lunch because he wasn't being discharge yet. Dr and SW called the son and arranged for him to be discharged today, but by the time he found out, the patient was worked up and thought we were lying.  Patient's belongings obtained and patient convinced to go change into his clothes. He then went back to stand at the sally port door and stood there for approximately 3 hrs until his son came.  Patient had to be helped to the exit because he was fixated on the break room thinking it was the exit.  Patient was escorted to the medical mall exit on foot since patient refused a wheelchair but once upstairs requested a wheelchair and it was obtained.  Patient safely in the car with belongings and discharge paperwork.

## 2024-08-08 NOTE — BHH Counselor (Signed)
 Adult Comprehensive Assessment  Patient ID: [name], male   DOB: Aug 22, 1941, 83 y.o.   MRN: [id number]  Information Source: Information source: Patient  Current Stressors:  Patient states their primary concerns and needs for treatment are:: UTA Patient states their goals for this hospitilization and ongoing recovery are:: Counsellor / Learning stressors: UTA Employment / Job issues: UTA Family Relationships: Oncologist / Lack of resources (include bankruptcy): UTA Housing / Lack of housing: UTA Physical health (include injuries & life threatening diseases): UTA Social relationships: UTA Substance abuse: UTA Bereavement / Loss: UTA  Living/Environment/Situation:  Living Arrangements:  (UTA) Living conditions (as described by patient or guardian): UTA Who else lives in the home?: UTA How long has patient lived in current situation?: UTA What is atmosphere in current home:  (UTA)  Family History:  Marital status:  (UTA) Are you sexually active?:  (UTA) What is your sexual orientation?: UTA Has your sexual activity been affected by drugs, alcohol, medication, or emotional stress?: UTA Does patient have children?:  (UTA)  Childhood History:  By whom was/is the patient raised?:  (UTA) Additional childhood history information: UTA Description of patient's relationship with caregiver when they were a child: UTA Patient's description of current relationship with people who raised him/her: UTA How were you disciplined when you got in trouble as a child/adolescent?: UTA Does patient have siblings?:  (UTA) Did patient suffer any verbal/emotional/physical/sexual abuse as a child?:  (UTA) Did patient suffer from severe childhood neglect?:  (UTA) Has patient ever been sexually abused/assaulted/raped as an adolescent or adult?:  (UTA) Was the patient ever a victim of a crime or a disaster?:  (UTA) Witnessed domestic violence?:  (UTA) Has patient been affected by domestic  violence as an adult?:  INDUSTRIAL/PRODUCT DESIGNER)  Education:  Highest grade of school patient has completed: UTA Currently a consulting civil engineer?:  (UTA) Learning disability?:  (UTA)  Employment/Work Situation:   Employment Situation:  INDUSTRIAL/PRODUCT DESIGNER) Patient's Job has Been Impacted by Current Illness:  (UTA) What is the Longest Time Patient has Held a Job?: UTA Where was the Patient Employed at that Time?: UTA Has Patient ever Been in the [employer]?:  (UTA)  Financial Resources:   Financial resources:  INDUSTRIAL/PRODUCT DESIGNER) Does patient have a lawyer or guardian?:  (UTA)  Alcohol/Substance Abuse:   What has been your use of drugs/alcohol within the last 12 months?: UTA If attempted suicide, did drugs/alcohol play a role in this?:  (UTA) Alcohol/Substance Abuse Treatment Hx:  (UTA) If yes, describe treatment: UTA Has alcohol/substance abuse ever caused legal problems?:  (UTA)  Social Support System:   Patient's Community Support System:  (UTA) Describe Community Support System: UTA Type of faith/religion: UTA How does patient's faith help to cope with current illness?: UTA  Leisure/Recreation:   Do You Have Hobbies?:  (UTA)  Strengths/Needs:   What is the patient's perception of their strengths?: UTA Patient states they can use these personal strengths during their treatment to contribute to their recovery: UTA Patient states these barriers may affect/interfere with their treatment: UTA Patient states these barriers may affect their return to the community: UTA Other important information patient would like considered in planning for their treatment: UTA  Discharge Plan:   Currently receiving community mental health services:  (UTA) Patient states concerns and preferences for aftercare planning are: UTA Patient states they will know when they are safe and ready for discharge when: UTA Does patient have access to transportation?:  (UTA) Does patient have financial barriers related to discharge  medications?:   (UTA) Patient description of barriers related to discharge medications: UTA Will patient be returning to same living situation after discharge?:  (UTA)  Summary/Recommendations:   Summary and Recommendations (to be completed by the evaluator): Patient is an 83 year-old male from [place], [place] [facility]). According to H&P, 83 y.o. Caucasian male with a past psychiatric history of bipolar disorder unspecified, with pertinent medical comorbidities/history that include biventricular heart failure, transcatheter aortic valve replacement, CAD, CKD stage IIIa, CHB, essential hypertension, hyperlipidemia, BPH, sleep apnea, type 2 diabetes, GERD, and acquired hypothyroidism, who presented this encounter by way of self with with the patient's son to reportedly pick up the patient's wife, when it was appreciated by ED staff and the patient's son, that the patient was in an altered mental state, thus the EDP team intervened to address the patient's altered mental status, who upon EDP examination, consulted psychiatry for specialty evaluation and recommendations.. Pt declines assessment as pt reports he is going home. Pt's primary diagnosis is  Psychosis, unspecified psychosis type (HCC) (F29). Recommendations include: crisis stabilization, therapeutic milieu, encourage group attendance and participation, medication management for mood stabilization and development of comprehensive mental wellness/sobriety plan.  [name]. 08/08/2024

## 2024-08-08 NOTE — BHH Counselor (Signed)
 CSW contacted pt's son with provider Theodis Kinsel, [phone]  Pt's son agreeable to discharge.   Lum Croft, MSW, [place] 08/08/2024 11:52 AM

## 2024-08-08 NOTE — Progress Notes (Signed)
 Family called after discharge looking for the patient's wallet. Discharging nurse states there was no record that a wallet was brought into the [facility] unit. This nurse called [facility] to inquire. Per security team, they will look into the matter and update. Phone number for Kathrine Pencil provided.

## 2024-08-10 ENCOUNTER — Ambulatory Visit (HOSPITAL_COMMUNITY)

## 2024-08-10 ENCOUNTER — Telehealth (HOSPITAL_COMMUNITY): Payer: Self-pay

## 2024-08-10 NOTE — Telephone Encounter (Signed)
 Called to set up paramedicine visit with [name] as he was recently discharged from [facility]. He did not agree to a visit this week but agreed to meet in the clinic next week. I plan to meet him there.   [name], EMT-Paramedic [phone] 08/10/2024

## 2024-08-11 ENCOUNTER — Ambulatory Visit: Attending: Student in an Organized Health Care Education/Training Program

## 2024-08-11 DIAGNOSIS — I442 Atrioventricular block, complete: Secondary | ICD-10-CM

## 2024-08-11 LAB — CUP PACEART INCLINIC DEVICE CHECK
Battery Remaining Longevity: 114 mo
Battery Voltage: 3.05 V
Brady Statistic RA Percent Paced: 17 %
Brady Statistic RV Percent Paced: 91 %
Date Time Interrogation Session: 20251030162846
Implantable Lead Connection Status: 753985
Implantable Lead Connection Status: 753985
Implantable Lead Implant Date: 20251016
Implantable Lead Implant Date: 20251016
Implantable Lead Location: 753859
Implantable Lead Location: 753860
Implantable Pulse Generator Implant Date: 20251016
Lead Channel Impedance Value: 437.5 Ohm
Lead Channel Impedance Value: 462.5 Ohm
Lead Channel Pacing Threshold Amplitude: 0.75 V
Lead Channel Pacing Threshold Amplitude: 0.75 V
Lead Channel Pacing Threshold Amplitude: 1.5 V
Lead Channel Pacing Threshold Amplitude: 1.5 V
Lead Channel Pacing Threshold Pulse Width: 0.5 ms
Lead Channel Pacing Threshold Pulse Width: 0.5 ms
Lead Channel Pacing Threshold Pulse Width: 0.5 ms
Lead Channel Pacing Threshold Pulse Width: 0.5 ms
Lead Channel Sensing Intrinsic Amplitude: 12 mV
Lead Channel Sensing Intrinsic Amplitude: 2.7 mV
Lead Channel Setting Pacing Amplitude: 1 V
Lead Channel Setting Pacing Amplitude: 3.375
Lead Channel Setting Pacing Pulse Width: 0.5 ms
Lead Channel Setting Sensing Sensitivity: 4 mV
Pulse Gen Model: 2272
Pulse Gen Serial Number: 5290842

## 2024-08-11 NOTE — Progress Notes (Signed)
 Dual chamber pacemaker wound check. Steristrips removed prior to appointment.  Incision is well healed.  Hematoma noted at device site.  Hematoma is soft to palpitation.  Evaluated with Dr. Cindie.  Will have Pt hold aspirin x 7 days.  Pressure dressing applied.  Pt to follow up next Monday to reevaluate. Presenting rhythm: AS/VP 72 . Routine testing performed. Thresholds, sensing, and impedance consistent with implant measurements and at 3.5V safety margin/auto capture until 3 month visit. No episodes. Reviewed arm restrictions to continue for 6 weeks total post op.  Pt enrolled in remote follow-up.

## 2024-08-11 NOTE — Patient Instructions (Signed)
 STOP taking ASPIRIN You will come back next Monday at 4:00 pm to reassess your wound site  After Your Pacemaker   Monitor your pacemaker site for redness, swelling, and drainage. Call the device clinic at [phone] if you experience these symptoms or fever/chills.  Your incision was closed with Steri-strips or staples:  You may shower 7 days after your procedure and wash your incision with soap and water. Avoid lotions, ointments, or perfumes over your incision until it is well-healed.  You may use a hot tub or a pool after your wound check appointment if the incision is completely closed.  Do not lift, push or pull greater than 10 pounds with the affected arm until 6 weeks after your procedure. There are no other restrictions in arm movement after your wound check appointment.  You may drive, unless driving has been restricted by your healthcare providers.  Remote monitoring is used to monitor your pacemaker from home. This monitoring is scheduled every 91 days by our office. It allows us  to keep an eye on the functioning of your device to ensure it is working properly. You will routinely see your Electrophysiologist annually (more often if necessary).

## 2024-08-15 ENCOUNTER — Ambulatory Visit: Attending: Endocrinology

## 2024-08-16 ENCOUNTER — Telehealth: Payer: Self-pay

## 2024-08-16 NOTE — Progress Notes (Incomplete)
 ADVANCED HF CLINIC NOTE  Primary Care: Nichole Senior, MD Primary Cardiologist: Alm Clay, MD HF Cardiologist: Dr. Rolan  HPI: [name] is a 83 y.o. male w/ HTN, HLD, DM2, CKD IIIa, hypothyroidism, OSA, biopolar disorder and newly diagnosed HFrEF and aortic stenosis.  Admitted 10/25 with new HF symptoms and marked volume overload. Echo showed biventricular failure, LVEF 30-35%, RV mod reduced, global hypokinesis w/ disproportionately severe hypokinesis/akinesis in the mid-apical left anterior and anteroseptal walls and severe AS, AVA 0.72 cm, mean gradient 33 mmHg. Interventricular septum flattened in systole, consistent with right ventricular pressure overload. Cardiology consulted. Being considered for TAVR. SCr rose w/ diuresis and low BP limited GDMT. Concern for low output HF. AHF team consulted. Co-ox intitally stable, did not require inotropes. He was diuresed. Underwent R/LHC with occluded LCx with R>L collaterals, severe LAD disease, mildly elevated filling pressures, normal fick CI (RA 7, PA 69/23 (38), PAPi 6.6, Fick CO/CI 4.67/2.56)). Plan transitioned to TAVR + PCI. Optimized from AHF standpoint, discharged with plan for scheduled TAVR 07/26/24.   Underwent TAVR 07/26/24 by Dr. Shyrl. Post op, pacing via TVP. 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 EP consulted. Echo showed EF 25-30%, LV with RWMA, GIIDD, RV normal, LA mod dilated, RA mildly dilated, no MR, AV repaired/replaced, normal structure/function of AV. Underwent PPM 07/28/24.  Staged PCI, pending dental extractions.   Follow up 10/25 post hospital, been off all meds and mildly volume overloaded. Meds resumed.  Admitted 10/25 with AMS/psychosis. Required 3 day admission to [facility] unit. ECG showed prolonged QTC, risperdol changed to Abilify.   Today he returns for post hospital follow up with son. Overall feeling good. Gets around his house without undue dyspnea. Physically limited by back pain. Lives with wife,  son lives next door. Denies palpitations, CP, dizziness, edema, or PND/Orthopnea. No SOB. Appetite ok. No fever or chills. Does not weight at home. Has not been taking any medications, spits them out. Son says he is having significant behavioral disturbances and he attributes this to SE from meds so he stopped them.   ECG (personally reviewed): none ordered today.  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 Verlin and Dr. Shyrl   Sleep apnea    Current Outpatient Medications  Medication Sig Dispense Refill   dapagliflozin propanediol (FARXIGA) 10 MG TABS tablet Take 1 tablet (10 mg total) by mouth daily. 30 tablet 1   aspirin 81 MG chewable tablet Chew 1 tablet (81 mg total) by mouth daily. (Patient not taking: Reported on 08/17/2024) 30 tablet 0   atorvastatin (LIPITOR) 80 MG tablet Take 1 tablet (80 mg total) by mouth daily. (Patient not taking: Reported on 08/17/2024) 30 tablet 1   furosemide (LASIX) 20 MG tablet Take 1 tablet (20 mg total) by mouth  daily. (Patient not taking: Reported on 08/17/2024) 30 tablet 3   levothyroxine (SYNTHROID) 75 MCG tablet Take 1 tablet (75 mcg total) by mouth daily before breakfast. (Patient not taking: Reported on 08/17/2024) 30 tablet 1   No current facility-administered medications for this  encounter.   No Known Allergies  Social History   Socioeconomic History   Marital status: Married    Spouse name: Not on file   Number of children: Not on file   Years of education: Not on file   Highest education level: Not on file  Occupational History   Not on file  Tobacco Use   Smoking status: Former    Types: Cigarettes   Smokeless tobacco: Never  Substance and Sexual Activity   Alcohol use: Yes   Drug use: Never   Sexual activity: Not on file  Other Topics Concern   Not on file  Social History Narrative   Not on file   Social Drivers of Health   Financial Resource Strain: Not on file  Food Insecurity: Patient Unable To Answer (08/05/2024)   Hunger Vital Sign    Worried About Running Out of Food in the Last Year: Patient unable to answer    Ran Out of Food in the Last Year: Patient unable to answer  Recent Concern: Food Insecurity - Food Insecurity Present (07/27/2024)   Hunger Vital Sign    Worried About Running Out of Food in the Last Year: Sometimes true    Ran Out of Food in the Last Year: Never true  Transportation Needs: Patient Unable To Answer (08/05/2024)   PRAPARE - Transportation    Lack of Transportation (Medical): Patient unable to answer    Lack of Transportation (Non-Medical): Patient unable to answer  Physical Activity: Not on file  Stress: Not on file  Social Connections: Unknown (08/05/2024)   Social Connection and Isolation Panel    Frequency of Communication with Friends and Family: Patient unable to answer    Frequency of Social Gatherings with Friends and Family: Patient unable to answer    Attends Religious Services: Patient unable to answer    Active Member of Clubs or Organizations: Patient unable to answer    Attends Banker Meetings: Patient unable to answer    Marital Status: Married  Catering Manager Violence: Patient Unable To Answer (08/05/2024)   Humiliation, Afraid, Rape, and Kick questionnaire    Fear of  Current or Ex-Partner: Patient unable to answer    Emotionally Abused: Patient unable to answer    Physically Abused: Patient unable to answer    Sexually Abused: Patient unable to answer   Family History  Problem Relation Age of Onset   CAD Father    Diabetes Mellitus II Maternal Grandmother    Wt Readings from Last 3 Encounters:  08/17/24 69.9 kg (154 lb)  08/05/24 67.1 kg (148 lb)  08/04/24 68 kg (149 lb 14.6 oz)   BP (!) 148/82   Pulse 73   Ht 5' 1 (1.549 m)   Wt 69.9 kg (154 lb)   SpO2 95%   BMI 29.10 kg/m   PHYSICAL EXAM: General:  NAD. No resp difficulty, arrived in [facility], elderly, chronically-ill appearing HEENT: Normal Neck: Supple. No JVD. Cor: Regular rate & rhythm. No rubs, gallops or murmurs. Lungs: Clear, diminished in bases Abdomen: Soft, nontender, nondistended.  Extremities: No cyanosis, clubbing, rash, 1-2+ BLE pret-tibial edema Neuro: Alert & oriented x 3, moves all 4 extremities w/o difficulty. Affect pleasant.  ASSESSMENT &  PLAN: 1. Chronic Biventricular Systolic Heart Failure: Recent diagnosis. Echo 07/13/24 LVEF 30-35%, RV mod reduced, GHK w/ disproportionately severe hypokinesis/akinesis in the mid-apical L anterior and anteroseptal walls and severe AS, AVA 0.72 cm, mean gradient 33 mmHg. Interventricular septum flattened in systole, consistent with RV pressure overload, mod RV dysfunction with mild RV enlargement. 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 RHC, after diuresis, showed mildly elevated filling pressures and normal CI by FICK (RA 7, PCW 18, CI 2.6), LHC showed occluded Lcx with right to left collaterals and severe LAD disease. NYHA II-III, functional class confounded by deconditioning and chronic back pain. Volume mildly elevated on exam, has not been taking medications. - Restart Farxiga 10 mg daily. BMET and BNP today. - Plan to add GDMT if he is agreeable. 2. Aortic Stenosis: severe by echo, AVA by VTI measured  at 72 cm, mean gradient 33 mmHg. - S/p successful TAVR with a 26 mm Edwards Sapien 3 Ultra Resilia THV via the TF approach on 07/26/24.  - Post operative echo showed EF 25-30%, mild RVE, normally functioning TAVR with a mean gradient of 3 mmHg and no PVL  - Follows with Structural Heart Team 3. CAD: LHC 10/25 with severe LAD disease and occluded LCx w/ R>>L collaterals. Plan for staged PCI post dental extractions. No chest pain - Restart atorvastatin 80 mg daily.  - Restart ASA 81. 4. CKD IIIa: Baseline SCr 1.3  - Restart Farxiga. BMET today. 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  6. Hypothyroidism: not taking levothyroxine. 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 This was started 06/2024 by PCP. - Check TSH today. 7. SDOH: Lives with wife, son lives next door and helps out. Behavioral/psych disturbances complicate medication adherence. ? Dementia. - Paramedicine now on board. - Needs PCP follow up.  Follow up in 2 weeks with APP for GDMT.  Harlene Gainer, FNP-BC 08/17/24

## 2024-08-16 NOTE — Telephone Encounter (Signed)
 Called to confirm/remind patient of their appointment at the [facility] on 08/17/24.   Appointment:   [x] Confirmed  [] Left mess   [] No answer/No voice mail  [] VM Full/unable to leave message  [] Phone not in service  Patient reminded to bring all medications and/or complete list.  Confirmed patient has transportation. Gave directions, instructed to utilize valet parking.

## 2024-08-17 ENCOUNTER — Encounter (HOSPITAL_COMMUNITY): Payer: Self-pay

## 2024-08-17 ENCOUNTER — Other Ambulatory Visit (HOSPITAL_COMMUNITY): Payer: Self-pay

## 2024-08-17 ENCOUNTER — Ambulatory Visit (HOSPITAL_COMMUNITY)
Admission: RE | Admit: 2024-08-17 | Discharge: 2024-08-17 | Disposition: A | Discharge status: Home / Self Care | Source: Ambulatory Visit | Attending: Family Medicine | Admitting: Family Medicine

## 2024-08-17 VITALS — BP 148/82 | HR 73 | Ht 61.0 in | Wt 154.0 lb

## 2024-08-17 DIAGNOSIS — Z952 Presence of prosthetic heart valve: Secondary | ICD-10-CM | POA: Diagnosis not present

## 2024-08-17 DIAGNOSIS — I442 Atrioventricular block, complete: Secondary | ICD-10-CM

## 2024-08-17 DIAGNOSIS — E785 Hyperlipidemia, unspecified: Secondary | ICD-10-CM | POA: Insufficient documentation

## 2024-08-17 DIAGNOSIS — I5082 Biventricular heart failure: Secondary | ICD-10-CM | POA: Diagnosis not present

## 2024-08-17 DIAGNOSIS — I10 Essential (primary) hypertension: Secondary | ICD-10-CM | POA: Diagnosis present

## 2024-08-17 DIAGNOSIS — I350 Nonrheumatic aortic (valve) stenosis: Secondary | ICD-10-CM | POA: Diagnosis not present

## 2024-08-17 DIAGNOSIS — Z139 Encounter for screening, unspecified: Secondary | ICD-10-CM

## 2024-08-17 DIAGNOSIS — Z79899 Other long term (current) drug therapy: Secondary | ICD-10-CM | POA: Insufficient documentation

## 2024-08-17 DIAGNOSIS — F319 Bipolar disorder, unspecified: Secondary | ICD-10-CM | POA: Diagnosis not present

## 2024-08-17 DIAGNOSIS — Z7984 Long term (current) use of oral hypoglycemic drugs: Secondary | ICD-10-CM | POA: Insufficient documentation

## 2024-08-17 DIAGNOSIS — G4733 Obstructive sleep apnea (adult) (pediatric): Secondary | ICD-10-CM | POA: Insufficient documentation

## 2024-08-17 DIAGNOSIS — E1122 Type 2 diabetes mellitus with diabetic chronic kidney disease: Secondary | ICD-10-CM | POA: Insufficient documentation

## 2024-08-17 DIAGNOSIS — I5022 Chronic systolic (congestive) heart failure: Secondary | ICD-10-CM | POA: Insufficient documentation

## 2024-08-17 DIAGNOSIS — I2510 Atherosclerotic heart disease of native coronary artery without angina pectoris: Secondary | ICD-10-CM | POA: Insufficient documentation

## 2024-08-17 DIAGNOSIS — I130 Hypertensive heart and chronic kidney disease with heart failure and stage 1 through stage 4 chronic kidney disease, or unspecified chronic kidney disease: Secondary | ICD-10-CM | POA: Diagnosis not present

## 2024-08-17 DIAGNOSIS — N1831 Chronic kidney disease, stage 3a: Secondary | ICD-10-CM | POA: Insufficient documentation

## 2024-08-17 DIAGNOSIS — E039 Hypothyroidism, unspecified: Secondary | ICD-10-CM | POA: Diagnosis not present

## 2024-08-17 LAB — COMPREHENSIVE METABOLIC PANEL WITH GFR
ALT: 24 U/L (ref 0–44)
AST: 39 U/L (ref 15–41)
Albumin: 3.5 g/dL (ref 3.5–5.0)
Alkaline Phosphatase: 67 U/L (ref 38–126)
Anion gap: 9 (ref 5–15)
BUN: 27 mg/dL — ABNORMAL HIGH (ref 8–23)
CO2: 24 mmol/L (ref 22–32)
Calcium: 9 mg/dL (ref 8.9–10.3)
Chloride: 106 mmol/L (ref 98–111)
Creatinine, Ser: 1.18 mg/dL (ref 0.61–1.24)
GFR, Estimated: >60 mL/min (ref >60)
Glucose, Bld: 125 mg/dL — ABNORMAL HIGH (ref 70–99)
Potassium: 4.7 mmol/L (ref 3.5–5.1)
Sodium: 139 mmol/L (ref 135–145)
Total Bilirubin: 1 mg/dL (ref 0.0–1.2)
Total Protein: 6.2 g/dL — ABNORMAL LOW (ref 6.5–8.1)

## 2024-08-17 LAB — TSH: TSH: 5.543 u[IU]/mL — ABNORMAL HIGH (ref 0.350–4.500)

## 2024-08-17 LAB — BRAIN NATRIURETIC PEPTIDE: B Natriuretic Peptide: 3478.4 pg/mL — ABNORMAL HIGH (ref 0.0–100.0)

## 2024-08-17 MED ORDER — DAPAGLIFLOZIN PROPANEDIOL 10 MG PO TABS
10.0000 mg | ORAL_TABLET | Freq: Every day | ORAL | 2 refills | Status: AC
  Filled 2024-08-17 – 2024-09-06 (×2): qty 90, 90d supply, fill #0

## 2024-08-17 NOTE — Patient Instructions (Addendum)
 RESTART Farxiga 10 mg daily   Labs done today, your results will be available in MyChart, we will contact you for abnormal readings.  Your physician recommends that you schedule a follow-up appointment as  scheduled  If you have any questions or concerns before your next appointment please send us  a message through [place] or call our office at [phone].    TO LEAVE A MESSAGE FOR THE NURSE SELECT OPTION 2, PLEASE LEAVE A MESSAGE INCLUDING: YOUR NAME DATE OF BIRTH CALL BACK NUMBER REASON FOR CALL**this is important as we prioritize the call backs  YOU WILL RECEIVE A CALL BACK THE SAME DAY AS LONG AS YOU CALL BEFORE 4:00 PM At the [facility], you and your health needs are our priority. As part of our continuing mission to provide you with exceptional heart care, we have created designated Provider Care Teams. These Care Teams include your primary Cardiologist (physician) and Advanced Practice Providers (APPs- Physician Assistants and Nurse Practitioners) who all work together to provide you with the care you need, when you need it.   You may see any of the following providers on your designated Care Team at your next follow up: Dr Toribio Fuel Dr [name] Dr. Morene Brownie Greig Mosses, NP Caffie Shed, [place] [facility], [place] Beckey Coe, NP Jordan Lee, NP Ellouise Class, NP Tinnie Redman, PharmD Jaun Bash, PharmD   Please be sure to bring in all your medications bottles to every appointment.    Thank you for choosing [facility]

## 2024-08-17 NOTE — Progress Notes (Signed)
 Paramedicine Encounter  Patient ID: [name], male, DOB: Sep 03, 1941, 83 y.o.,  MRN: [id number]  Met patient in clinic today with provider.   Weight @ clinic- 154lbs  Weight @ home- uknown- first paramedicine encounter today in clinic   B/P- 148/82   P- 73   SP02- 95%  REDS CLIP- N/A  Med changes- stop all meds except farxiga and aspirin   Social Changes- lives at home with wife and son who lives next door   David Gainer NP seen patient in the clinic today where he was in a wheel chair escorted by his son. He is CAOX4, warm and dry with some mild lower leg edema, normotensive. No complaints. His son states his dad has had some mental health concerns lately as he was [facility]. There is some concern with sundowners or alzheimers. I plan to assist patient getting appointment with PCP to review and evaluate. Meds changed to only Farxiga and Aspirin going forward. No pill box needed at present. Son gives his dad his meds daily. [name] agreed to visit in the home next week. Clinic visit complete.    [name], EMT-Paramedic [phone] 08/17/2024

## 2024-08-17 NOTE — Addendum Note (Signed)
 Encounter addended by: Glena Harlene HERO, FNP on: 08/17/2024 4:44 PM  Actions taken: Clinical Note Signed

## 2024-08-22 ENCOUNTER — Ambulatory Visit (HOSPITAL_COMMUNITY): Payer: Self-pay | Admitting: Family Medicine

## 2024-08-24 ENCOUNTER — Telehealth (HOSPITAL_COMMUNITY): Payer: Self-pay

## 2024-08-24 NOTE — Telephone Encounter (Signed)
 Left message with Mr. Ruz and for his son Joane in an attempt to set up home visit for today. No answer. I will continue to try.   [name], EMT-Paramedic [phone] 08/24/2024

## 2024-08-29 ENCOUNTER — Telehealth (HOSPITAL_COMMUNITY): Payer: Self-pay | Admitting: Cardiology

## 2024-08-29 ENCOUNTER — Other Ambulatory Visit (HOSPITAL_COMMUNITY): Payer: Self-pay

## 2024-08-29 MED ORDER — FUROSEMIDE 20 MG PO TABS
40.0000 mg | ORAL_TABLET | Freq: Every day | ORAL | 3 refills | Status: DC
  Filled 2024-08-29: qty 60, 30d supply, fill #0

## 2024-08-29 NOTE — Telephone Encounter (Signed)
   Please call Heather.  He is only taking 1 medication on his list.   He needs to take lasix 40 mg daily for the next 3 days.   Set up follow up this week if possible.   Candelaria Pies NP-C  3:10 PM    .

## 2024-08-29 NOTE — Progress Notes (Signed)
 Paramedicine Encounter    Patient ID: [name], male    DOB: [birth year], 83 y.o.   MRN: [id number]   Complaints- feeling tired, legs swollen   Assessment- CAOX4, warm and dry seated at the kitchen table   Compliance with meds- taking Farxiga only daily.   Pill box filled- no pill box.   Refills needed- none   Meds changes since last visit- today per clinic Lasix 40mg  for three says then reassess then plan to see APP on Monday 11/24.     Social changes- rescheduled PCP and dental appointments.    VISIT SUMMARY- Arrived for home visit for [name] who reports to be feeling okay having some lower leg swelling and tightness with fatigue. He also reports he is not urinating a lot. He sleeps with one pillow at night- denied shortness of breath. I obtained vitals noting he is up 9 lbs from last visit- he has lower leg swelling and tightness. Lungs clear. He is only taking Farxiga daily per DOROTHA Gainer he is to take only Farxiga and Aspirin daily- she states he does not need the Atorvastatin. I relayed info to triage and APP advised he is to take 40mg  Lasix for three days then follow up in home wit me and HF clinic on Monday. I plan to do so. Meds called into [facility] at [facility]. HF education provided and confirmed. I plan to see him again Thursday.   BP 128/70   Pulse 68   Resp 16   Wt 162 lb (73.5 kg)   SpO2 99%   BMI 30.61 kg/m  Weight yesterday-- didn't weigh  Last visit weight-- 154lbs      ACTION: Home visit completed     Patient Care Team: Nichole Senior, MD as PCP - General (Endocrinology) Anner Alm ORN, MD as PCP - Cardiology (Cardiology) Verlin Lonni BIRCH, MD as PCP - Structural Heart (Cardiology) Inocencio Soyla Lunger, MD as PCP - Electrophysiology (Cardiology)  Patient Active Problem List   Diagnosis Date Noted   Psychosis, unspecified psychosis type (HCC) 08/05/2024   Psychosis (HCC) 08/04/2024   S/P TAVR (transcatheter aortic valve  replacement) 07/26/2024   Coronary artery disease 07/19/2024   Biventricular heart failure, NYHA class 3 (HCC) 07/14/2024   Symptomatic severe aortic stenosis with low ejection fraction 07/14/2024   Acquired hypothyroidism 07/12/2024   Stage 3a chronic kidney disease (HCC) 07/12/2024   Acute on chronic systolic CHF (congestive heart failure) (HCC) 07/12/2024   Hyponatremia 02/09/2022   Essential hypertension 02/09/2022   Obesity 12/29/2011   Type 2 diabetes mellitus with hyperlipidemia (HCC) 04/15/2011   Fatty (change of) liver, not elsewhere classified 07/16/2010   Bipolar disorder (HCC) 08/30/2009   Atherosclerotic heart disease of native coronary artery without angina pectoris 08/30/2009   Hyperlipidemia 08/30/2009   BPH (benign prostatic hyperplasia) 08/30/2009   Sleep apnea 08/30/2009   GERD 04/24/2008   FLATULENCE ERUCTATION AND GAS PAIN 04/24/2008    Current Outpatient Medications:    dapagliflozin propanediol (FARXIGA) 10 MG TABS tablet, Take 1 tablet (10 mg total) by mouth daily., Disp: 90 tablet, Rfl: 2   aspirin 81 MG chewable tablet, Chew 1 tablet (81 mg total) by mouth daily. (Patient not taking: Reported on 08/29/2024), Disp: 30 tablet, Rfl: 0   atorvastatin (LIPITOR) 80 MG tablet, Take 1 tablet (80 mg total) by mouth daily. (Patient not taking: Reported on 08/29/2024), Disp: 30 tablet, Rfl: 1   furosemide (LASIX) 20 MG tablet, Take 1 tablet (20 mg total) by  mouth daily. (Patient not taking: Reported on 08/29/2024), Disp: 30 tablet, Rfl: 3   levothyroxine (SYNTHROID) 75 MCG tablet, Take 1 tablet (75 mcg total) by mouth daily before breakfast. (Patient not taking: Reported on 08/29/2024), Disp: 30 tablet, Rfl: 1 No Known Allergies   Social History   Socioeconomic History   Marital status: Married    Spouse name: Not on file   Number of children: Not on file   Years of education: Not on file   Highest education level: Not on file  Occupational History   Not on file   Tobacco Use   Smoking status: Former    Types: Cigarettes   Smokeless tobacco: Never  Substance and Sexual Activity   Alcohol use: Yes   Drug use: Never   Sexual activity: Not on file  Other Topics Concern   Not on file  Social History Narrative   Not on file   Social Drivers of Health   Financial Resource Strain: Not on file  Food Insecurity: Patient Unable To Answer (08/05/2024)   Hunger Vital Sign    Worried About Running Out of Food in the Last Year: Patient unable to answer    Ran Out of Food in the Last Year: Patient unable to answer  Recent Concern: Food Insecurity - Food Insecurity Present (07/27/2024)   Hunger Vital Sign    Worried About Running Out of Food in the Last Year: Sometimes true    Ran Out of Food in the Last Year: Never true  Transportation Needs: Patient Unable To Answer (08/05/2024)   PRAPARE - Transportation    Lack of Transportation (Medical): Patient unable to answer    Lack of Transportation (Non-Medical): Patient unable to answer  Physical Activity: Not on file  Stress: Not on file  Social Connections: Unknown (08/05/2024)   Social Connection and Isolation Panel    Frequency of Communication with Friends and Family: Patient unable to answer    Frequency of Social Gatherings with Friends and Family: Patient unable to answer    Attends Religious Services: Patient unable to answer    Active Member of Clubs or Organizations: Patient unable to answer    Attends Banker Meetings: Patient unable to answer    Marital Status: Married  Catering Manager Violence: Patient Unable To Answer (08/05/2024)   Humiliation, Afraid, Rape, and Kick questionnaire    Fear of Current or Ex-Partner: Patient unable to answer    Emotionally Abused: Patient unable to answer    Physically Abused: Patient unable to answer    Sexually Abused: Patient unable to answer    Physical Exam      Future Appointments  Date Time Provider Department Center   09/05/2024  8:35 AM [facility]-ECHO 5 H[facility]-ECHO H&V  09/05/2024  9:55 AM Sebastian Lamarr SAUNDERS, PA-C [facility] H&V  09/05/2024  3:00 PM [facility] PA/NP [facility] None  09/07/2024  7:05 AM CVD HVT DEVICE REMOTES [facility] H&V  11/09/2024  3:20 PM Anner Alm ORN, MD [facility] H&V  11/22/2024  2:45 PM Inocencio Soyla Lunger, MD [facility] H&V  12/07/2024  7:05 AM CVD HVT DEVICE REMOTES [facility] H&V  03/08/2025  7:05 AM CVD HVT DEVICE REMOTES [facility] H&V  06/07/2025  7:05 AM CVD HVT DEVICE REMOTES [facility] H&V  09/06/2025  7:05 AM CVD HVT DEVICE REMOTES [facility] H&V  12/06/2025  7:05 AM CVD HVT DEVICE REMOTES [facility] H&V

## 2024-08-29 NOTE — Telephone Encounter (Signed)
 Left message with wife

## 2024-08-29 NOTE — Addendum Note (Signed)
 Addended by: Irais Mottram, DALTON HERO on: 08/29/2024 04:34 PM   Modules accepted: Orders

## 2024-08-29 NOTE — Telephone Encounter (Signed)
 Will make med adjustments and make him aware.   [name], EMT-Paramedic [phone] 08/29/2024

## 2024-08-29 NOTE — Telephone Encounter (Signed)
 Heather with para medicine called to report the following during patients home visit Weight today 162, last week 154 Increased fatigue and SOB with minimal exertion BLE  Vitals 128/70 HR 68  O2 99% on room air   Pt is currently only taking farxiga 10 mg daily  Please advise

## 2024-09-01 ENCOUNTER — Telehealth (HOSPITAL_COMMUNITY): Payer: Self-pay

## 2024-09-01 NOTE — Telephone Encounter (Signed)
 Spoke to [name] today and says he has been taking his Lasix 40mg  daily the last few days and it is helping- his weight is down to 156lbs and his swelling is improving. He and I plan to follow up in clinic on Monday- call complete.   [name], EMT-Paramedic [phone] 09/01/2024

## 2024-09-02 ENCOUNTER — Telehealth (HOSPITAL_COMMUNITY): Payer: Self-pay

## 2024-09-02 NOTE — Telephone Encounter (Signed)
 Called to confirm/remind patient of their appointment at the [facility] on 09/05/2024.   Appointment:   [x] Confirmed  [] Left mess   [] No answer/No voice mail  [] VM Full/unable to leave message  [] Phone not in service  Patient reminded to bring all medications and/or complete list.  Confirmed patient has transportation. Gave directions, instructed to utilize valet parking.

## 2024-09-05 ENCOUNTER — Other Ambulatory Visit (HOSPITAL_COMMUNITY): Payer: Self-pay

## 2024-09-05 ENCOUNTER — Encounter (HOSPITAL_COMMUNITY): Payer: Self-pay

## 2024-09-05 ENCOUNTER — Ambulatory Visit: Attending: Physician Assistant | Admitting: Physician Assistant

## 2024-09-05 ENCOUNTER — Telehealth (HOSPITAL_COMMUNITY): Payer: Self-pay

## 2024-09-05 ENCOUNTER — Other Ambulatory Visit: Payer: Self-pay

## 2024-09-05 ENCOUNTER — Ambulatory Visit (HOSPITAL_COMMUNITY)
Admission: RE | Admit: 2024-09-05 | Discharge: 2024-09-05 | Disposition: A | Source: Ambulatory Visit | Attending: Cardiovascular Disease | Admitting: Cardiovascular Disease

## 2024-09-05 ENCOUNTER — Ambulatory Visit (HOSPITAL_COMMUNITY)
Admission: RE | Admit: 2024-09-05 | Discharge: 2024-09-05 | Disposition: A | Discharge status: Home / Self Care | Source: Ambulatory Visit | Attending: Cardiovascular Disease | Admitting: Cardiovascular Disease

## 2024-09-05 VITALS — BP 146/84 | HR 65 | Wt 161.2 lb

## 2024-09-05 VITALS — BP 122/68 | HR 62 | Ht 66.0 in | Wt 161.2 lb

## 2024-09-05 DIAGNOSIS — Z139 Encounter for screening, unspecified: Secondary | ICD-10-CM | POA: Diagnosis not present

## 2024-09-05 DIAGNOSIS — K089 Disorder of teeth and supporting structures, unspecified: Secondary | ICD-10-CM | POA: Diagnosis not present

## 2024-09-05 DIAGNOSIS — I5082 Biventricular heart failure: Secondary | ICD-10-CM

## 2024-09-05 DIAGNOSIS — K862 Cyst of pancreas: Secondary | ICD-10-CM | POA: Diagnosis not present

## 2024-09-05 DIAGNOSIS — Z952 Presence of prosthetic heart valve: Secondary | ICD-10-CM | POA: Diagnosis not present

## 2024-09-05 DIAGNOSIS — I442 Atrioventricular block, complete: Secondary | ICD-10-CM | POA: Insufficient documentation

## 2024-09-05 DIAGNOSIS — N1831 Chronic kidney disease, stage 3a: Secondary | ICD-10-CM

## 2024-09-05 DIAGNOSIS — E039 Hypothyroidism, unspecified: Secondary | ICD-10-CM | POA: Insufficient documentation

## 2024-09-05 DIAGNOSIS — Z950 Presence of cardiac pacemaker: Secondary | ICD-10-CM

## 2024-09-05 DIAGNOSIS — I2510 Atherosclerotic heart disease of native coronary artery without angina pectoris: Secondary | ICD-10-CM | POA: Insufficient documentation

## 2024-09-05 LAB — BASIC METABOLIC PANEL WITH GFR
Anion gap: 11 (ref 5–15)
BUN: 30 mg/dL — ABNORMAL HIGH (ref 8–23)
CO2: 26 mmol/L (ref 22–32)
Calcium: 8.9 mg/dL (ref 8.9–10.3)
Chloride: 104 mmol/L (ref 98–111)
Creatinine, Ser: 1.13 mg/dL (ref 0.61–1.24)
GFR, Estimated: >60 mL/min (ref >60)
Glucose, Bld: 108 mg/dL — ABNORMAL HIGH (ref 70–99)
Potassium: 3.9 mmol/L (ref 3.5–5.1)
Sodium: 141 mmol/L (ref 135–145)

## 2024-09-05 LAB — ECHOCARDIOGRAM COMPLETE
AR max vel: 1.79 cm2
AV Area VTI: 1.83 cm2
AV Area mean vel: 1.75 cm2
AV Mean grad: 7.5 mmHg
AV Peak grad: 13.7 mmHg
Ao pk vel: 1.85 m/s
Area-P 1/2: 3.62 cm2
MV VTI: 1.8 cm2
S' Lateral: 4.6 cm

## 2024-09-05 LAB — MAGNESIUM: Magnesium: 2 mg/dL (ref 1.7–2.4)

## 2024-09-05 LAB — BRAIN NATRIURETIC PEPTIDE: B Natriuretic Peptide: 2498.9 pg/mL — ABNORMAL HIGH (ref 0.0–100.0)

## 2024-09-05 MED ORDER — POTASSIUM CHLORIDE CRYS ER 20 MEQ PO TBCR
40.0000 meq | EXTENDED_RELEASE_TABLET | Freq: Every day | ORAL | 0 refills | Status: DC | PRN
  Filled 2024-09-05: qty 6, 3d supply, fill #0

## 2024-09-05 MED ORDER — FUROSCIX 80 MG/10ML ~~LOC~~ CTKT
80.0000 mg | CARTRIDGE | Freq: Every day | SUBCUTANEOUS | 0 refills | Status: DC | PRN
  Filled 2024-09-05: qty 10, 10d supply, fill #0

## 2024-09-05 MED ORDER — AMOXICILLIN 500 MG PO CAPS
2000.0000 mg | ORAL_CAPSULE | ORAL | 12 refills | Status: AC
  Filled 2024-09-05: qty 12, 3d supply, fill #0

## 2024-09-05 NOTE — Telephone Encounter (Signed)
 Advanced [facility] Failure Patient Advocate Encounter  Prior authorization for Furoscix  has been submitted and approved. Test billing returns $12.15 for 90 day supply.  Key: [id number] Effective: 10/14/2023 to 10/12/2025  Rachel DEL, CPhT Rx Patient Advocate Phone: [phone]

## 2024-09-05 NOTE — Progress Notes (Unsigned)
 [facility]   MULTIDISCIPLINARY HEART VALVE CLINIC                                     Cardiology Office Note:    Date:  09/06/2024   ID:  [name], DOB Feb 16, 1941, MRN [id number]  PCP:  Nichole Senior, MD  [facility] Cardiologist:  Alm Clay, MD  [facility] Structural heart: Lonni Cash, MD [facility] Electrophysiologist:  Will Gladis Norton, MD   Referring MD: Nichole Senior, MD   1 month s/p TAVR  History of Present Illness:    [name] is a 83 y.o. 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   Admitted to [facility] 9/30-10/9/25 for acute CHF requiring IV diuresis complicated by cardiorenal syndrome. Echo 07/13/24 showed 30-35% with global hypokinesis and disproportionately severe hypokinesis/akinesis in the mid-apical left anterior and anteroseptal walls, moderate RV dysfunction/enlargement, mild MR, and severe AS with mean gradient 33 mm hg, Vmax 3.93 m/s, AVA 0.77, DVI 0.19, SVI 41. [facility] 07/18/24 showed 100% pLCX stenosis with R-->L collaterals and 90% mLAD as well as mildly elevated filling pressures. Plans were made for staged PCI after dental extractions. S/p successful TAVR with a 26 mm Edwards Sapien 3 Ultra Resilia THV via the TF approach on 07/26/24. Post operative echo showed EF 25-30%, mild RVE, normally functioning TAVR with a mean gradient of 3 mmHg and no PVL. 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 Follow up 10/25 post hospital, been off all meds and mildly volume overloaded. Meds resumed. Admitted 07/2024 with AMS/psychosis. Required 3 day admission to [facility] unit. ECG showed prolonged QTC, risperdol changed to Abilify .  Today the patient presents to clinic for follow up. Here  with his son. He has some mild worsening of his LE edema. No orthopnea or PND. No CP. Sob much improved. Came off his aspirin  81mg  daily given increased bruising. Stopped taking synthroid  as son thinks this cause paranoia like symptoms. Mood is now stabilized.    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 Cash and Dr. Shyrl   Sleep apnea      Current Medications: Current Meds  Medication Sig   amoxicillin  (AMOXIL ) 500 MG capsule Take 4 capsules (2,000 mg total) by mouth as directed 1 hour prior to dental work including cleanings.   dapagliflozin  propanediol (FARXIGA ) 10 MG TABS tablet Take 1 tablet (10 mg total) by mouth daily.   furosemide  (LASIX ) 20 MG tablet Take 2 tablets (40 mg total) by mouth daily.      ROS:   Please see the history of present illness.    All other systems reviewed and are negative.  EKGs       Risk Assessment/Calculations:           Physical Exam:    VS:  BP 122/68   Pulse 62   Ht 5' 6 (1.676  m)   Wt 161 lb 3.2 oz (73.1 kg)   SpO2 95%   BMI 26.02 kg/m     Wt Readings from Last 3 Encounters:  09/05/24 161 lb 3.2 oz (73.1 kg)  09/05/24 161 lb 3.2 oz (73.1 kg)  08/29/24 162 lb (73.5 kg)     GEN: Well nourished, well developed in no acute distress NECK: No JVD CARDIAC: RRR, no murmurs, rubs, gallops RESPIRATORY:  mild crackles at bases ABDOMEN: Soft, non-tender, non-distended EXTREMITIES:  1+ bilateral LE edema; No deformity.    ASSESSMENT:    1. S/P TAVR (transcatheter aortic valve replacement)   2. Pacemaker   3. Biventricular heart failure, NYHA class 3 (HCC)   4. Coronary artery disease involving native coronary artery of native heart without angina pectoris   5.  CKD stage 3a, GFR 45-59 ml/min (HCC)   6. Poor dentition   7. Cyst of pancreas     PLAN:    In order of problems listed above:  Severe AS s/p TAVR:  -- Echo today shows EF 30%, severe pulm HTN, mod-severe MAC with mild MR/MS, mild-mod TR, normally functioning TAVR with a mean gradient of 7.5 mm hg and no PVL.  -- NYHA class II symptoms.  -- Continue Aspirin  81mg  daily (asked him to restart this).  -- SBE prophylaxis discussed; I have RX'd amoxicillin .  -- I will see back for 1 year office visit with echo.  CHB s/p PPM: -- S/p Abbott Assurity [id number] dual-chamber pacemaker on 07/28/24 by Dr. Inocencio.   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 -- Had not bee complaint with medications. -- Has some mild worsening of LE edema and crackles as bases. Seeing CHF clinic later today but think he would need increase diuretic.   CAD -- LHC w/ severe LAD disease and occluded LCx w/ R>>L collaterals -- Will need staged PCI after TAVR. Plan to do this after dental extractions, depending on when those will be scheduled.  He has follow up with Dr. Anner in January.  -- Continue atorva 80 mg. -- Continue ASA 81 mg.   CKD IIIa -- Scr baseline ~ 1.3-1.6. -- Creat stable at 1.18 on labs 08/17/24 (personally reviewed).   Poor dentition: -- Will need full mouth extractions after TAVR.  -- Referral sent to Dr. Celena. Orthopantogram done here sent to their office.  -- Has an apt Wednesday 08/18/24 @ 3pm for dental extractions (son thinks)   Cyst of pancreas: -- Pre TAVR CTs showed an 11 mm cystic lesion in the tail of the pancreas, increasing in size since previous study. Recommend follow up pre and post contrast MRI/MRCP or pancreatic protocol CT in 2 years. -- This was dicussed today, printed out for pt to discuss with PCP.      Cardiac Rehabilitation Eligibility Assessment  The patient is ready to start cardiac rehabilitation from a  cardiac standpoint.          Medication Adjustments/Labs and Tests Ordered: Current medicines are reviewed at length with the patient today.  Concerns regarding medicines are outlined above.  Orders Placed This Encounter  Procedures   ECHOCARDIOGRAM COMPLETE   Meds ordered this encounter  Medications   amoxicillin  (AMOXIL ) 500 MG capsule    Sig: Take 4 capsules (2,000 mg total) by mouth as directed 1 hour prior to dental work including cleanings.    Dispense:  12 capsule    Refill:  12    Supervising Provider:  COOPER, MICHAEL [[id number]]    Patient Instructions  Medication Instructions:  Your provider recommends that you continue on your current medications as directed. Please refer to the Current Medication list given to you today. Please restart taking a baby aspirin  81 mg daily.    An antibiotic has been called into your pharmacy to take as needed for dental work. Please follow instructions on the bottle.  *If you need a refill on your cardiac medications before your next appointment, please call your pharmacy*  Lab Work: none If you have labs (blood work) drawn today and your tests are completely normal, you will receive your results only by: MyChart Message (if you have MyChart) OR A paper copy in the mail If you have any lab test that is abnormal or we need to change your treatment, we will call you to review the results.  Testing/Procedures: none  Follow-Up: At [facility], you and your health needs are our priority.  As part of our continuing mission to provide you with exceptional heart care, our providers are all part of one team.  This team includes your primary Cardiologist (physician) and Advanced Practice Providers or APPs (Physician Assistants and Nurse Practitioners) who all work together to provide you with the care you need, when you need it.  Your next appointment:    Please keep your follow up as scheduled.   We will see you back in 1 year  with an echocardiogram (ultrasound of your heart).     Signed, Lamarr Hummer, PA-C  09/06/2024 6:42 AM    [facility]

## 2024-09-05 NOTE — Patient Instructions (Addendum)
 Medication Changes:  Your provider has order Furoscix  for you. This is an on-body infuser that gives you a dose of Furosemide .   It will be shipped to your home from [facility], they will call you before shipping  Ensure you write down the time you start your infusion so that if there is a problem you will know how long the infusion lasted  Use Furoscix  only AS DIRECTED by our office  Dosing Directions: DO NOT TAKE FUROSEMIDE  WHILE USING THE FUROSCIX   Day 1= ON TUESDAY USE ONE FUROSCIX  KIT WITH OF POTASSIUM   Day 2= ON WEDNESDAY USE ONE FUROSCIX  KIT WITH OF POTASSIUM   Day 3= ON THURSDAY USE ONE FUROSCIX  KIT WITH OF POTASSIUM   Day 4= ON FRIDAY RESUME NORMAL DOSING OF FUROSEMIDE    Lab Work:  Labs done today, your results will be available in MyChart, we will contact you for abnormal readings.  Follow-Up in: 1 WEEK AS SCHEDULED   At the [facility], you and your health needs are our priority. We have a designated team specialized in the treatment of Heart Failure. This Care Team includes your primary Heart Failure Specialized Cardiologist (physician), Advanced Practice Providers (APPs- Physician Assistants and Nurse Practitioners), and Pharmacist who all work together to provide you with the care you need, when you need it.   You may see any of the following providers on your designated Care Team at your next follow up:  Dr. Toribio Fuel Dr. [name] Dr. Odis Brownie Greig Mosses, NP Caffie Shed, [place] [facility] [place], [place] Beckey Coe, NP Jordan Lee, NP Tinnie Redman, PharmD   Please be sure to bring in all your medications bottles to every appointment.   Need to Contact Us :  If you have any questions or concerns before your next appointment please send us  a message through [place] or call our office at [phone].    TO LEAVE A MESSAGE FOR THE NURSE SELECT OPTION 2, PLEASE LEAVE A MESSAGE  INCLUDING: YOUR NAME DATE OF BIRTH CALL BACK NUMBER REASON FOR CALL**this is important as we prioritize the call backs  YOU WILL RECEIVE A CALL BACK THE SAME DAY AS LONG AS YOU CALL BEFORE 4:00 PM

## 2024-09-05 NOTE — Progress Notes (Signed)
 ReDS Vest / Clip - 09/05/24 1500       ReDS Vest / Clip   Station Marker C    Ruler Value 33    ReDS Value Range High volume overload    ReDS Actual Value 47

## 2024-09-05 NOTE — Patient Instructions (Addendum)
 Medication Instructions:  Your provider recommends that you continue on your current medications as directed. Please refer to the Current Medication list given to you today. Please restart taking a baby aspirin  81 mg daily.    An antibiotic has been called into your pharmacy to take as needed for dental work. Please follow instructions on the bottle.  *If you need a refill on your cardiac medications before your next appointment, please call your pharmacy*  Lab Work: none If you have labs (blood work) drawn today and your tests are completely normal, you will receive your results only by: MyChart Message (if you have MyChart) OR A paper copy in the mail If you have any lab test that is abnormal or we need to change your treatment, we will call you to review the results.  Testing/Procedures: none  Follow-Up: At [facility], you and your health needs are our priority.  As part of our continuing mission to provide you with exceptional heart care, our providers are all part of one team.  This team includes your primary Cardiologist (physician) and Advanced Practice Providers or APPs (Physician Assistants and Nurse Practitioners) who all work together to provide you with the care you need, when you need it.  Your next appointment:    Please keep your follow up as scheduled.   We will see you back in 1 year with an echocardiogram (ultrasound of your heart).

## 2024-09-05 NOTE — Progress Notes (Signed)
 Paramedicine Encounter  Patient ID: Ramesh Moan, male, DOB: [birth year], 83 y.o.,  MRN: [id number]  Met patient in clinic today with provider. Mr. Selders reports feeling swollen, he says he has been taking his medications and son confirms but he has bilateral edema up past his knees and into his groin. Harlene saw him today and plans for him to use furoscix  for three days with potassium supplement and then start back on lasix  40mg  daily after. I plan to follow up tomorrow to place furoscix  on him in the home and educate again. I also plan to follow up for clinic visit on 12/2. He agreed with plan.   Weight @ clinic- 161lbs  Weight @ home- 160lbs   B/P- 146/84   P- 65   SP02- 95%   REDS CLIP- 47%   Med changes-  Furoscix  for three dayy then back to 40mg  daily of Lasix , Potassium 40MEq for three days with Furoscix .   Social Changes- none    [name], EMT-Paramedic [phone] 09/05/2024

## 2024-09-05 NOTE — Progress Notes (Signed)
 ADVANCED HF CLINIC NOTE  Primary Care: Nichole Senior, MD Primary Cardiologist: Alm Clay, MD HF Cardiologist: Dr. Rolan  HPI: [name] is a 83 y.o. male w/ HTN, HLD, DM2, CKD IIIa, hypothyroidism, OSA, biopolar disorder and newly diagnosed HFrEF and aortic stenosis.  Admitted 10/25 with new HF symptoms and marked volume overload. Echo showed biventricular failure, LVEF 30-35%, RV mod reduced, global hypokinesis w/ disproportionately severe hypokinesis/akinesis in the mid-apical left anterior and anteroseptal walls and severe AS, AVA 0.72 cm, mean gradient 33 mmHg. Interventricular septum flattened in systole, consistent with right ventricular pressure overload. Cardiology consulted. Being considered for TAVR. SCr rose w/ diuresis and low BP limited GDMT. Concern for low output HF. AHF team consulted. Co-ox intitally stable, did not require inotropes. He was diuresed. Underwent R/LHC with occluded LCx with R>L collaterals, severe LAD disease, mildly elevated filling pressures, normal fick CI (RA 7, PA 69/23 (38), PAPi 6.6, Fick CO/CI 4.67/2.56)). Plan transitioned to TAVR + PCI. Optimized from AHF standpoint, discharged with plan for scheduled TAVR 07/26/24.   Underwent TAVR 07/26/24 by Dr. Shyrl. Post op, pacing via TVP. 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 EP consulted. Echo showed EF 25-30%, LV with RWMA, GIIDD, RV normal, LA mod dilated, RA mildly dilated, no MR, AV repaired/replaced, normal structure/function of AV. Underwent PPM 07/28/24.  Staged PCI, pending dental extractions.   Follow up 10/25 post hospital, been off all meds and mildly volume overloaded. Meds resumed.  Admitted 10/25 with AMS/psychosis. Required 3 day admission to [facility] unit. ECG showed prolonged QTC, risperdol changed to Abilify .  Today he returns for HF follow up with his son & paramedic, Powell. Overall feeling fine. Has swelling into thighs and genitals. No SOB walking on flat ground, he admits he  does not move around much. Rare atypical chest prick at rest, resolved spontaneously. Denies palpitations, abnormal bleeding, CP, dizziness, or PND/Orthopnea. Appetite ok. Weight at home 160 pounds. Taking all medications.   ReDs reading: 47%, abnormal  ECG (personally reviewed): none ordered today.  Device interrogation (personally reviewed): 89% VP, no AT/AF  Labs (10/25): K 4.6, creatinine 1.13, LDL 89 Labs (11/25): K 4.7, creatinine 1.18, normal LFTs, normal TSH  Cardiac Studies - Echo 07/27/24: EF 25-30%, G2DD, RV normal, stable TAVR - Ltd echo 07/26/24: stable TAVR, EF 25-30%, RV moderately reduced - R/LHC 10/25: 100% pCx lesion, 90% mLAD lesion; RA 8, PA 65/23 (38), PCWP 18 with v waves to 26, CO/CI (Fick) 4.67/2.56 - Echo 07/13/24: EF 30-35%, G2DD, interventricular septum flattened in systole, RV moderately reduced  Past Medical History:  Diagnosis Date   Aortic stenosis, severe    Diabetes mellitus without complication (HCC)    Hypertension    Hypothyroidism    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 Verlin and Dr. Shyrl   Sleep apnea    Current Outpatient Medications  Medication Sig Dispense Refill   amoxicillin  (AMOXIL ) 500 MG capsule Take 4 capsules (2,000 mg total) by mouth as directed 1 hour prior to dental work including cleanings. 12 capsule 12   aspirin  81 MG chewable tablet Chew 1 tablet (81 mg total) by mouth daily. 30 tablet 0   atorvastatin  (LIPITOR ) 80 MG tablet Take 1 tablet (80 mg total) by mouth daily. 30 tablet 1   dapagliflozin  propanediol (FARXIGA ) 10 MG TABS tablet Take 1 tablet (10 mg total) by mouth daily.  90 tablet 2   furosemide  (LASIX ) 20 MG tablet Take 2 tablets (40 mg total) by mouth daily. 60 tablet 3   levothyroxine  (SYNTHROID ) 75 MCG tablet Take 1 tablet (75 mcg total) by mouth daily before breakfast. 30 tablet 1   No current  facility-administered medications for this encounter.   No Known Allergies  Social History   Socioeconomic History   Marital status: Married    Spouse name: Not on file   Number of children: Not on file   Years of education: Not on file   Highest education level: Not on file  Occupational History   Not on file  Tobacco Use   Smoking status: Former    Types: Cigarettes   Smokeless tobacco: Never  Substance and Sexual Activity   Alcohol use: Yes   Drug use: Never   Sexual activity: Not on file  Other Topics Concern   Not on file  Social History Narrative   Not on file   Social Drivers of Health   Financial Resource Strain: Not on file  Food Insecurity: Patient Unable To Answer (08/05/2024)   Hunger Vital Sign    Worried About Running Out of Food in the Last Year: Patient unable to answer    Ran Out of Food in the Last Year: Patient unable to answer  Recent Concern: Food Insecurity - Food Insecurity Present (07/27/2024)   Hunger Vital Sign    Worried About Running Out of Food in the Last Year: Sometimes true    Ran Out of Food in the Last Year: Never true  Transportation Needs: Patient Unable To Answer (08/05/2024)   PRAPARE - Transportation    Lack of Transportation (Medical): Patient unable to answer    Lack of Transportation (Non-Medical): Patient unable to answer  Physical Activity: Not on file  Stress: Not on file  Social Connections: Unknown (08/05/2024)   Social Connection and Isolation Panel    Frequency of Communication with Friends and Family: Patient unable to answer    Frequency of Social Gatherings with Friends and Family: Patient unable to answer    Attends Religious Services: Patient unable to answer    Active Member of Clubs or Organizations: Patient unable to answer    Attends Banker Meetings: Patient unable to answer    Marital Status: Married  Catering Manager Violence: Patient Unable To Answer (08/05/2024)   Humiliation, Afraid,  Rape, and Kick questionnaire    Fear of Current or Ex-Partner: Patient unable to answer    Emotionally Abused: Patient unable to answer    Physically Abused: Patient unable to answer    Sexually Abused: Patient unable to answer   Family History  Problem Relation Age of Onset   CAD Father    Diabetes Mellitus II Maternal Grandmother    Wt Readings from Last 3 Encounters:  09/05/24 73.1 kg (161 lb 3.2 oz)  09/05/24 73.1 kg (161 lb 3.2 oz)  08/29/24 73.5 kg (162 lb)   BP (!) 146/84   Pulse 65   Wt 73.1 kg (161 lb 3.2 oz)   SpO2 95%   BMI 26.02 kg/m   PHYSICAL EXAM: General:  NAD. No resp difficulty, arrived in [facility], chronically-ill appearing HEENT: Normal Neck: Supple. JVP to jaw Cor: Regular rate & rhythm. No rubs, gallops or murmurs. Lungs: Faint basilar crackles Abdomen: Soft, nontender, nondistended.  Extremities: No cyanosis, clubbing, rash, 3+ BLE edema to thighs Neuro: Alert & oriented x 3, moves all 4 extremities w/o difficulty. Affect pleasant.  ASSESSMENT & PLAN: 1. Chronic Biventricular Systolic Heart Failure: Recent diagnosis. Echo 07/13/24 LVEF 30-35%, RV mod reduced, GHK w/ disproportionately severe hypokinesis/akinesis in the mid-apical L anterior and anteroseptal walls and severe AS, AVA 0.72 cm, mean gradient 33 mmHg. Interventricular septum flattened in systole, consistent with RV pressure overload, mod RV dysfunction with mild RV enlargement. 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 RHC, after diuresis, showed mildly elevated filling pressures and normal CI by FICK (RA 7, PCW 18, CI 2.6), LHC showed occluded Lcx with right to left collaterals and severe LAD disease. NYHA II-III, functional class confounded by deconditioning and chronic back pain. He is markedly volume overloaded today, REDs 47%. GDMT has been limited by patient's unwillingness to add medication. - Use Furoscix  + 40 KCL daily x 3 days, hold Lasix  while using Furoscix .  BMET, BNP and Mag today - After 3 days, restart Lasix  40 mg daily. - Continue Farxiga  10 mg daily.  - Plan to add GDMT if he is agreeable. 2. Aortic Stenosis: severe by echo, AVA by VTI measured at 72 cm, mean gradient 33 mmHg. - S/p successful TAVR with a 26 mm Edwards Sapien 3 Ultra Resilia THV via the TF approach on 07/26/24.  - Post operative echo showed EF 25-30%, mild RVE, normally functioning TAVR with a mean gradient of 3 mmHg and no PVL  - Follows with Structural Heart Team 3. CAD: LHC 10/25 with severe LAD disease and occluded LCx w/ R>>L collaterals. Plan for staged PCI post dental extractions. No chest pain - Continue atorvastatin  80 mg daily.  - Continue ASA 81. 4. CKD IIIa: Baseline SCr 1.3  - Continue Farxiga . BMET today. 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  6. Hypothyroidism: not taking levothyroxine . 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 This was started 06/2024 by PCP. - Continue levothyroxine . 7. SDOH: Lives with wife, son lives next door and helps out. Behavioral/psych disturbances complicate medication adherence. ? Dementia. - Paramedicine now on board. - Needs PCP follow up.  Follow up in 1 week with APP.   Harlene Gainer, FNP-BC 09/05/24

## 2024-09-05 NOTE — Progress Notes (Signed)
 Medication Samples have been provided to the patient.  Drug name: FUROSCIX        Strength: 80MG         Qty: 2 KITS  LOT: [id number]  Exp.Date: 09/11/2025  Dosing instructions: ONLY AS DIRECTED BY THE HEART FAILURE CLINIC  The patient has been instructed regarding the correct time, dose, and frequency of taking this medication, including desired effects and most common side effects.   [name] [name] 3:46 PM 09/05/2024

## 2024-09-05 NOTE — Progress Notes (Signed)
 Specialty Pharmacy Refill Coordination Note  [name] is a 83 y.o. male contacted today regarding refills of specialty medication(s) Furosemide  (Furoscix , LASIX )   Patient requested Delivery   Delivery date: 09/07/24   Verified address: [address]   [place] [address]   Medication will be filled on: 09/06/24

## 2024-09-06 ENCOUNTER — Other Ambulatory Visit (HOSPITAL_COMMUNITY): Payer: Self-pay

## 2024-09-06 ENCOUNTER — Ambulatory Visit: Payer: Self-pay | Admitting: Physician Assistant

## 2024-09-06 ENCOUNTER — Ambulatory Visit (HOSPITAL_COMMUNITY): Payer: Self-pay | Admitting: Family Medicine

## 2024-09-06 ENCOUNTER — Other Ambulatory Visit: Payer: Self-pay

## 2024-09-06 NOTE — Progress Notes (Signed)
 Paramedicine Encounter    Patient ID: [name], male    DOB: 09-17-1941, 83 y.o.   MRN: [id number]   Santina out to place furoscix  on [name] for today- the first sample failed and flashed red- I placed the second one with success. He should get the remainder of the furoscix  today or tomorrow. I reviewed meds and filled pill box with appropriate meds and plan to follow up on Tuesday in clinic. Home visit complete.   ACTION: Home visit completed

## 2024-09-07 ENCOUNTER — Ambulatory Visit: Attending: Endocrinology

## 2024-09-07 DIAGNOSIS — I5082 Biventricular heart failure: Secondary | ICD-10-CM

## 2024-09-07 NOTE — Progress Notes (Signed)
 COVID/RSV/influenza swab required in the emergency department due to psychiatric condition and requirements for inpatient psychiatric admission if deemed necessary. The swabs are requested by the psychiatric facilities prior to acceptance

## 2024-09-09 LAB — CUP PACEART REMOTE DEVICE CHECK
Battery Remaining Longevity: 115 mo
Battery Remaining Percentage: 95.5 %
Battery Voltage: 3.04 V
Brady Statistic AP VP Percent: 35 %
Brady Statistic AP VS Percent: 1 %
Brady Statistic AS VP Percent: 54 %
Brady Statistic AS VS Percent: 2.1 %
Brady Statistic RA Percent Paced: 24 %
Brady Statistic RV Percent Paced: 89 %
Date Time Interrogation Session: 20251126020038
Implantable Lead Connection Status: 753985
Implantable Lead Connection Status: 753985
Implantable Lead Implant Date: 20251016
Implantable Lead Implant Date: 20251016
Implantable Lead Location: 753859
Implantable Lead Location: 753860
Implantable Pulse Generator Implant Date: 20251016
Lead Channel Impedance Value: 430 Ohm
Lead Channel Impedance Value: 450 Ohm
Lead Channel Pacing Threshold Amplitude: 0.875 V
Lead Channel Pacing Threshold Amplitude: 1.125 V
Lead Channel Pacing Threshold Pulse Width: 0.5 ms
Lead Channel Pacing Threshold Pulse Width: 0.5 ms
Lead Channel Sensing Intrinsic Amplitude: 1.3 mV
Lead Channel Sensing Intrinsic Amplitude: 12 mV
Lead Channel Setting Pacing Amplitude: 1.125
Lead Channel Setting Pacing Amplitude: 2.125
Lead Channel Setting Pacing Pulse Width: 0.5 ms
Lead Channel Setting Sensing Sensitivity: 4 mV
Pulse Gen Model: 2272
Pulse Gen Serial Number: 5290842

## 2024-09-12 ENCOUNTER — Other Ambulatory Visit (HOSPITAL_COMMUNITY): Payer: Self-pay

## 2024-09-12 ENCOUNTER — Telehealth (HOSPITAL_COMMUNITY): Payer: Self-pay

## 2024-09-12 NOTE — Telephone Encounter (Signed)
 Called and spoke to Mr. Medal and he says he completed his 3 furoscix  last week and responded well to same. I reminded him of his appointment tomorrow and will plan to follow up next week in the home pending med changes.  He agreed. Call complete.   [name], EMT-Paramedic [phone] 09/12/2024

## 2024-09-12 NOTE — Progress Notes (Signed)
 Remote PPM Transmission

## 2024-09-12 NOTE — Telephone Encounter (Signed)
 Called to confirm/remind patient of their appointment at the [facility] Clinic on 09/13/24.   Appointment:   [x] Confirmed  [] Left mess   [] No answer/No voice mail  [] VM Full/unable to leave message  [] Phone not in service  Patient reminded to bring all medications and/or complete list.  Confirmed patient has transportation. Gave directions, instructed to utilize valet parking.

## 2024-09-13 ENCOUNTER — Other Ambulatory Visit (HOSPITAL_COMMUNITY): Payer: Self-pay

## 2024-09-13 ENCOUNTER — Ambulatory Visit (HOSPITAL_COMMUNITY)
Admission: RE | Admit: 2024-09-13 | Discharge: 2024-09-13 | Disposition: A | Source: Ambulatory Visit | Attending: Internal Medicine

## 2024-09-13 ENCOUNTER — Encounter (HOSPITAL_COMMUNITY): Payer: Self-pay

## 2024-09-13 VITALS — BP 100/60 | HR 43 | Ht 66.0 in | Wt 156.2 lb

## 2024-09-13 DIAGNOSIS — G4733 Obstructive sleep apnea (adult) (pediatric): Secondary | ICD-10-CM | POA: Insufficient documentation

## 2024-09-13 DIAGNOSIS — Z79899 Other long term (current) drug therapy: Secondary | ICD-10-CM | POA: Insufficient documentation

## 2024-09-13 DIAGNOSIS — Z139 Encounter for screening, unspecified: Secondary | ICD-10-CM | POA: Diagnosis not present

## 2024-09-13 DIAGNOSIS — I350 Nonrheumatic aortic (valve) stenosis: Secondary | ICD-10-CM | POA: Diagnosis not present

## 2024-09-13 DIAGNOSIS — I5082 Biventricular heart failure: Secondary | ICD-10-CM | POA: Diagnosis not present

## 2024-09-13 DIAGNOSIS — N1831 Chronic kidney disease, stage 3a: Secondary | ICD-10-CM | POA: Insufficient documentation

## 2024-09-13 DIAGNOSIS — Z87891 Personal history of nicotine dependence: Secondary | ICD-10-CM | POA: Insufficient documentation

## 2024-09-13 DIAGNOSIS — I442 Atrioventricular block, complete: Secondary | ICD-10-CM | POA: Diagnosis not present

## 2024-09-13 DIAGNOSIS — I130 Hypertensive heart and chronic kidney disease with heart failure and stage 1 through stage 4 chronic kidney disease, or unspecified chronic kidney disease: Secondary | ICD-10-CM | POA: Insufficient documentation

## 2024-09-13 DIAGNOSIS — M7989 Other specified soft tissue disorders: Secondary | ICD-10-CM | POA: Insufficient documentation

## 2024-09-13 DIAGNOSIS — F0390 Unspecified dementia without behavioral disturbance: Secondary | ICD-10-CM | POA: Insufficient documentation

## 2024-09-13 DIAGNOSIS — Z7982 Long term (current) use of aspirin: Secondary | ICD-10-CM | POA: Insufficient documentation

## 2024-09-13 DIAGNOSIS — M549 Dorsalgia, unspecified: Secondary | ICD-10-CM | POA: Insufficient documentation

## 2024-09-13 DIAGNOSIS — Z7984 Long term (current) use of oral hypoglycemic drugs: Secondary | ICD-10-CM | POA: Insufficient documentation

## 2024-09-13 DIAGNOSIS — E8770 Fluid overload, unspecified: Secondary | ICD-10-CM | POA: Insufficient documentation

## 2024-09-13 DIAGNOSIS — I2510 Atherosclerotic heart disease of native coronary artery without angina pectoris: Secondary | ICD-10-CM | POA: Diagnosis not present

## 2024-09-13 DIAGNOSIS — Z79890 Hormone replacement therapy (postmenopausal): Secondary | ICD-10-CM | POA: Insufficient documentation

## 2024-09-13 DIAGNOSIS — Z953 Presence of xenogenic heart valve: Secondary | ICD-10-CM | POA: Insufficient documentation

## 2024-09-13 DIAGNOSIS — G8929 Other chronic pain: Secondary | ICD-10-CM | POA: Diagnosis not present

## 2024-09-13 DIAGNOSIS — E039 Hypothyroidism, unspecified: Secondary | ICD-10-CM | POA: Insufficient documentation

## 2024-09-13 DIAGNOSIS — E1122 Type 2 diabetes mellitus with diabetic chronic kidney disease: Secondary | ICD-10-CM | POA: Insufficient documentation

## 2024-09-13 DIAGNOSIS — I5022 Chronic systolic (congestive) heart failure: Secondary | ICD-10-CM | POA: Diagnosis not present

## 2024-09-13 DIAGNOSIS — E785 Hyperlipidemia, unspecified: Secondary | ICD-10-CM | POA: Insufficient documentation

## 2024-09-13 DIAGNOSIS — Z950 Presence of cardiac pacemaker: Secondary | ICD-10-CM | POA: Insufficient documentation

## 2024-09-13 LAB — MAGNESIUM: Magnesium: 2.2 mg/dL (ref 1.7–2.4)

## 2024-09-13 LAB — BASIC METABOLIC PANEL WITH GFR
Anion gap: 10 (ref 5–15)
BUN: 45 mg/dL — ABNORMAL HIGH (ref 8–23)
CO2: 28 mmol/L (ref 22–32)
Calcium: 9.1 mg/dL (ref 8.9–10.3)
Chloride: 97 mmol/L — ABNORMAL LOW (ref 98–111)
Creatinine, Ser: 1.43 mg/dL — ABNORMAL HIGH (ref 0.61–1.24)
GFR, Estimated: 48 mL/min — ABNORMAL LOW (ref >60)
Glucose, Bld: 99 mg/dL (ref 70–99)
Potassium: 4 mmol/L (ref 3.5–5.1)
Sodium: 135 mmol/L (ref 135–145)

## 2024-09-13 LAB — BRAIN NATRIURETIC PEPTIDE: B Natriuretic Peptide: 1652.9 pg/mL — ABNORMAL HIGH (ref 0.0–100.0)

## 2024-09-13 MED ORDER — FUROSCIX 80 MG/10ML ~~LOC~~ CTKT
80.0000 mg | CARTRIDGE | Freq: Every day | SUBCUTANEOUS | 0 refills | Status: DC | PRN
  Filled 2024-09-13: qty 5, 5d supply, fill #0

## 2024-09-13 MED ORDER — FUROSCIX 80 MG/10ML ~~LOC~~ CTKT
80.0000 mg | CARTRIDGE | Freq: Every day | SUBCUTANEOUS | 0 refills | Status: AC | PRN
  Filled 2024-09-13: qty 5, 5d supply, fill #0

## 2024-09-13 MED ORDER — POTASSIUM CHLORIDE CRYS ER 20 MEQ PO TBCR
40.0000 meq | EXTENDED_RELEASE_TABLET | Freq: Every day | ORAL | 0 refills | Status: DC | PRN
  Filled 2024-09-13: qty 30, 15d supply, fill #0

## 2024-09-13 MED ORDER — POTASSIUM CHLORIDE CRYS ER 20 MEQ PO TBCR: 40.0000 meq | EXTENDED_RELEASE_TABLET | Freq: Every day | ORAL | 0 refills | Status: DC | PRN

## 2024-09-13 NOTE — Progress Notes (Signed)
 ADVANCED HF CLINIC NOTE  Primary Care: Nichole Senior, MD Primary Cardiologist: Alm Clay, MD HF Cardiologist: Dr. Rolan  HPI: [name] is a 83 y.o. male w/ HTN, HLD, DM2, CKD IIIa, hypothyroidism, OSA, biopolar disorder and newly diagnosed HFrEF and aortic stenosis.  Admitted 10/25 with new HF symptoms and marked volume overload. Echo showed biventricular failure, LVEF 30-35%, RV mod reduced, global hypokinesis w/ disproportionately severe hypokinesis/akinesis in the mid-apical left anterior and anteroseptal walls and severe AS, AVA 0.72 cm, mean gradient 33 mmHg. Interventricular septum flattened in systole, consistent with right ventricular pressure overload. Cardiology consulted. Being considered for TAVR. SCr rose w/ diuresis and low BP limited GDMT. Concern for low output HF. AHF team consulted. Co-ox intitally stable, did not require inotropes. He was diuresed. Underwent R/LHC with occluded LCx with R>L collaterals, severe LAD disease, mildly elevated filling pressures, normal fick CI (RA 7, PA 69/23 (38), PAPi 6.6, Fick CO/CI 4.67/2.56)). Plan transitioned to TAVR + PCI. Optimized from AHF standpoint, discharged with plan for scheduled TAVR 07/26/24.   Underwent TAVR 07/26/24 by Dr. Shyrl. Post op, pacing via TVP. 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 EP consulted. Echo showed EF 25-30%, LV with RWMA, GIIDD, RV normal, LA mod dilated, RA mildly dilated, no MR, AV repaired/replaced, normal structure/function of AV. Underwent PPM 07/28/24.  Staged PCI, pending dental extractions.   Follow up 10/25 post hospital, been off all meds and mildly volume overloaded. Meds resumed.  Admitted 10/25 with AMS/psychosis. Required 3 day admission to [facility] unit. ECG showed prolonged QTC, risperdol changed to Abilify .  Today he returns for AHF follow up with his son. Overall feeling ok. Denies palpitations, CP, dizziness, or PND/Orthopnea. Has swelling in his legs. SOB with activity.  Appetite ok, tries to avoid eating salty foods but eats canned food every now and then. Weight at home 146-153 pounds. Taking all medications. He lost 3-4 lbs with each furoscix  dose last week, overall down 11lbs based on home weights. He recorded pre and post furoscix  weights!  ReDs reading: 42 %, abnormal   ECG (personally reviewed): none ordered today.  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 Verlin and Dr. Shyrl   Sleep apnea    Current Outpatient Medications  Medication Sig Dispense Refill   amoxicillin  (AMOXIL ) 500 MG capsule Take 4 capsules (2,000 mg total) by mouth as directed 1 hour prior to dental work including cleanings. 12 capsule 12   aspirin  81 MG chewable tablet Chew 1 tablet (81 mg total) by mouth daily. 30 tablet 0   atorvastatin  (LIPITOR ) 80 MG tablet Take 1 tablet (80 mg total) by mouth daily. 30 tablet 1   dapagliflozin  propanediol (FARXIGA ) 10 MG TABS tablet Take 1 tablet (10 mg total) by mouth daily. 90 tablet 2  Furosemide  (FUROSCIX ) 80 MG/10ML CTKT Inject 80 mg into the skin daily as needed (ONLY USE AS DIRECTED BY THE HEART FAILURE CLINIC). 10 each 0   furosemide  (LASIX ) 20 MG tablet Take 2 tablets (40 mg total) by mouth daily. 60 tablet 3   levothyroxine  (SYNTHROID ) 75 MCG  tablet Take 1 tablet (75 mcg total) by mouth daily before breakfast. 30 tablet 1   potassium chloride  SA (KLOR-CON  M) 20 MEQ tablet TAKE 2 TABLETS ONCE DAILY ON TUESDAY, WEDNESDAY, AND THURSDAY WITH FUROSCIX  DOSE 6 tablet 0   No current facility-administered medications for this encounter.   No Known Allergies  Social History   Socioeconomic History   Marital status: Married    Spouse name: Not on file   Number of children: Not on file   Years of education: Not on file   Highest education level: Not on file  Occupational History   Not on file  Tobacco Use   Smoking status: Former    Types: Cigarettes   Smokeless tobacco: Never  Substance and Sexual Activity   Alcohol use: Never   Drug use: Never   Sexual activity: Not on file  Other Topics Concern   Not on file  Social History Narrative   Not on file   Social Drivers of Health   Financial Resource Strain: Not on file  Food Insecurity: Patient Unable To Answer (08/05/2024)   Hunger Vital Sign    Worried About Running Out of Food in the Last Year: Patient unable to answer    Ran Out of Food in the Last Year: Patient unable to answer  Recent Concern: Food Insecurity - Food Insecurity Present (07/27/2024)   Hunger Vital Sign    Worried About Running Out of Food in the Last Year: Sometimes true    Ran Out of Food in the Last Year: Never true  Transportation Needs: Patient Unable To Answer (08/05/2024)   PRAPARE - Transportation    Lack of Transportation (Medical): Patient unable to answer    Lack of Transportation (Non-Medical): Patient unable to answer  Physical Activity: Not on file  Stress: Not on file  Social Connections: Unknown (08/05/2024)   Social Connection and Isolation Panel    Frequency of Communication with Friends and Family: Patient unable to answer    Frequency of Social Gatherings with Friends and Family: Patient unable to answer    Attends Religious Services: Patient unable to answer    Active Member  of Clubs or Organizations: Patient unable to answer    Attends Banker Meetings: Patient unable to answer    Marital Status: Married  Catering Manager Violence: Patient Unable To Answer (08/05/2024)   Humiliation, Afraid, Rape, and Kick questionnaire    Fear of Current or Ex-Partner: Patient unable to answer    Emotionally Abused: Patient unable to answer    Physically Abused: Patient unable to answer    Sexually Abused: Patient unable to answer   Family History  Problem Relation Age of Onset   CAD Father    Diabetes Mellitus II Maternal Grandmother    Wt Readings from Last 3 Encounters:  09/13/24 70.9 kg (156 lb 3.2 oz)  09/05/24 73.1 kg (161 lb 3.2 oz)  09/05/24 73.1 kg (161 lb 3.2 oz)   BP 100/60   Pulse (!) 43   Ht 5' 6 (1.676 m)   Wt 70.9 kg (156 lb 3.2 oz)   SpO2 96%   BMI 25.21 kg/m   PHYSICAL EXAM: General:  elderly  appearing.  No respiratory difficulty. Arrived in [facility].  Neck: JVD ~10 cm.  Cor: Regular rate & rhythm. No murmurs. Lungs: clear Extremities: +1-2 BLE edema  Neuro: alert & oriented x 3. Affect pleasant.   ASSESSMENT & PLAN: 1. Chronic Biventricular Systolic Heart Failure: Recent diagnosis. Echo 07/13/24 LVEF 30-35%, RV mod reduced, GHK w/ disproportionately severe hypokinesis/akinesis in the mid-apical L anterior and anteroseptal walls and severe AS, AVA 0.72 cm, mean gradient 33 mmHg. Interventricular septum flattened in systole, consistent with RV pressure overload, mod RV dysfunction with mild RV enlargement. 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 RHC, after diuresis, showed mildly elevated filling pressures and normal CI by FICK (RA 7, PCW 18, CI 2.6), LHC showed occluded Lcx with right to left collaterals and severe LAD disease.  - NYHA II-III, functional class confounded by deconditioning and chronic back pain. Remains volume overloaded but improved from last week. REDs 43%. GDMT has been limited by  patient's unwillingness to add medication. - Will repeat Furoscix  + 40 KCL daily x 3 days, hold Lasix  while using Furoscix . BMET, BNP and Mag today  - After 3 days, restart Lasix  40 mg daily. - Continue Farxiga  10 mg daily.  - Plan to add GDMT if he is agreeable. 2. Aortic Stenosis: severe by echo, AVA by VTI measured at 72 cm, mean gradient 33 mmHg. - S/p successful TAVR with a 26 mm Edwards Sapien 3 Ultra Resilia THV via the TF approach on 07/26/24.  - Post operative echo showed EF 25-30%, mild RVE, normally functioning TAVR with a mean gradient of 3 mmHg and no PVL  - Follows with Structural Heart Team 3. CAD: LHC 10/25 with severe LAD disease and occluded LCx w/ R>>L collaterals. Plan for staged PCI post dental extractions (has f/u with dentist tomorrow but will reschedule for next week as he's still volume overloaded) No chest pain - Continue atorvastatin  80 mg daily.  - Continue ASA 81. 4. CKD IIIa: Baseline SCr 1.3  - Continue Farxiga . BMET today. 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  6. Hypothyroidism: - Continue levothyroxine . 7. SDOH: Lives with wife, son lives next door and helps out. Behavioral/psych disturbances complicate medication adherence. ? Dementia. - Paramedicine now on board. - Has upcoming appt with PCP.   Followed by paramedicine, appreciate their assistance!   Follow up in 1 week with APP.   Beckey LITTIE Coe AGACNP-BC  09/13/24

## 2024-09-13 NOTE — Patient Instructions (Addendum)
 Good to see you today!   Your provider has order Furoscix  for you. This is an on-body infuser that gives you a dose of Furosemide .   It will be shipped to your home from [facility], they will call you before shipping  Ensure you write down the time you start your infusion so that if there is a problem you will know how long the infusion lasted  Use Furoscix  only AS DIRECTED by our office  Dosing Directions:   Day 1= Tuesday Furoscix  kit with 40 meq of potassium  Day 2= Wednesday Furoscix  kit with 40 meq of potassium  Day 3= Thursday Furoscix  kit with 40 meq of potassium  NO lasix  during the infusions  Friday restart lasix  40 mg daily  Labs done today, your results will be available in MyChart, we will contact you for abnormal readings.  Your physician recommends that you schedule a follow-up appointment as scheduled  Please wear compression hose daily  apply in the morning and off in the evening  If you have any questions or concerns before your next appointment please send us  a message through Campus or call our office at [phone].    TO LEAVE A MESSAGE FOR THE NURSE SELECT OPTION 2, PLEASE LEAVE A MESSAGE INCLUDING: YOUR NAME DATE OF BIRTH CALL BACK NUMBER REASON FOR CALL**this is important as we prioritize the call backs  YOU WILL RECEIVE A CALL BACK THE SAME DAY AS LONG AS YOU CALL BEFORE 4:00 PM At the [facility], you and your health needs are our priority. As part of our continuing mission to provide you with exceptional heart care, we have created designated Provider Care Teams. These Care Teams include your primary Cardiologist (physician) and Advanced Practice Providers (APPs- Physician Assistants and Nurse Practitioners) who all work together to provide you with the care you need, when you need it.   You may see any of the following providers on your designated Care Team at your next follow up: Dr Toribio Fuel Dr  [name] Dr. Morene Brownie Greig Mosses, NP Caffie Shed, [place] [facility] [place], [place] Beckey Coe, NP Jordan Lee, NP Ellouise Class, NP Tinnie Redman, PharmD Jaun Bash, PharmD   Please be sure to bring in all your medications bottles to every appointment.    Thank you for choosing [facility]

## 2024-09-14 ENCOUNTER — Other Ambulatory Visit (HOSPITAL_COMMUNITY): Payer: Self-pay

## 2024-09-14 ENCOUNTER — Telehealth (HOSPITAL_COMMUNITY): Payer: Self-pay

## 2024-09-14 NOTE — Telephone Encounter (Signed)
 [name] called me and says he tried to do a furoscix  last night and it cut off after 3 hours of use- he removed it and plans to do another one today. I asked to follow up in the home today but he asked if I could come out tomorrow instead. Call complete.    [name], EMT-Paramedic [phone] 09/14/2024

## 2024-09-15 ENCOUNTER — Ambulatory Visit (HOSPITAL_COMMUNITY): Payer: Self-pay | Admitting: Internal Medicine

## 2024-09-15 ENCOUNTER — Other Ambulatory Visit (HOSPITAL_COMMUNITY): Payer: Self-pay

## 2024-09-15 NOTE — Progress Notes (Signed)
 Paramedicine Encounter    Patient ID: [name], male    DOB: 05-31-1941, 83 y.o.   MRN: [id number]   Complaints- none   Assessment- CAOX4, warm and dry, MUCH IMPROVED LOWER LEG EDEMA. Weight loss totaling almost 20lbs since 11/24.   Compliance with meds- complaint   Pill box filled- for one week   Refills needed- none   Meds changes since last visit- none     Social changes- none   - He was having some repeat conversation of politics and religion during our visit today and was very passionate about these topics- this was some of the similar behavior noted in his previous ER visit but it was not a constant conversation he was easily re-routed in conversation. He will see PCP next week on 12/10.    VISIT SUMMARY- Met with [name] in the home today to review medications and confirm Furoscix . He has been doing well with this and will complete his third one tomorrow. I reviewed meds and filled pill box for one week. We reviewed appointments and I confirmed same and wrote them down. I plan to meet him in the home next Thursday. He will be seen in clinic Tuesday but I will be on the truck and cannot be with him. He agreed with plan. We discussed diet and fluid intake and he understood. I will see him in one week.   Wt 149 lb (67.6 kg)   BMI 24.05 kg/m  Weight yesterday-- 152lbs (before furoscix )  Last visit weight-- 160lbs      ACTION: Home visit completed     Patient Care Team: Nichole Senior, MD as PCP - General (Endocrinology) Anner Alm ORN, MD as PCP - Cardiology (Cardiology) Verlin Lonni BIRCH, MD as PCP - Structural Heart (Cardiology) Inocencio Soyla Lunger, MD as PCP - Electrophysiology (Cardiology)  Patient Active Problem List   Diagnosis Date Noted   Psychosis, unspecified psychosis type (HCC) 08/05/2024   Psychosis (HCC) 08/04/2024   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/26/2024   Coronary artery disease 07/19/2024   Biventricular heart  failure, NYHA class 3 (HCC) 07/14/2024   Symptomatic severe aortic stenosis with low ejection fraction 07/14/2024   Acquired hypothyroidism 07/12/2024   Stage 3a chronic kidney disease (HCC) 07/12/2024   Acute on chronic systolic CHF (congestive heart failure) (HCC) 07/12/2024   Hyponatremia 02/09/2022   Essential hypertension 02/09/2022   Obesity 12/29/2011   Type 2 diabetes mellitus with hyperlipidemia (HCC) 04/15/2011   Fatty (change of) liver, not elsewhere classified 07/16/2010   Bipolar disorder (HCC) 08/30/2009   Atherosclerotic heart disease of native coronary artery without angina pectoris 08/30/2009   Hyperlipidemia 08/30/2009   BPH (benign prostatic hyperplasia) 08/30/2009   Sleep apnea 08/30/2009   GERD 04/24/2008   FLATULENCE ERUCTATION AND GAS PAIN 04/24/2008    Current Outpatient Medications:    amoxicillin  (AMOXIL ) 500 MG capsule, Take 4 capsules (2,000 mg total) by mouth as directed 1 hour prior to dental work including cleanings., Disp: 12 capsule, Rfl: 12   aspirin  81 MG chewable tablet, Chew 1 tablet (81 mg total) by mouth daily., Disp: 30 tablet, Rfl: 0   atorvastatin  (LIPITOR ) 80 MG tablet, Take 1 tablet (80 mg total) by mouth daily., Disp: 30 tablet, Rfl: 1   dapagliflozin  propanediol (FARXIGA ) 10 MG TABS tablet, Take 1 tablet (10 mg total) by mouth daily., Disp: 90 tablet, Rfl: 2   Furosemide  (FUROSCIX ) 80 MG/10ML CTKT, Inject 80 mg into the skin daily as needed (ONLY USE  AS DIRECTED BY THE HEART FAILURE CLINIC)., Disp: 5 each, Rfl: 0   furosemide  (LASIX ) 20 MG tablet, Take 2 tablets (40 mg total) by mouth daily., Disp: 60 tablet, Rfl: 3   levothyroxine  (SYNTHROID ) 75 MCG tablet, Take 1 tablet (75 mcg total) by mouth daily before breakfast., Disp: 30 tablet, Rfl: 1   potassium chloride  SA (KLOR-CON  M) 20 MEQ tablet, TAKE 2 TABLETS ONCE DAILY ON TUESDAY, WEDNESDAY, AND THURSDAY WITH FUROSCIX  DOSE, Disp: 30 tablet, Rfl: 0 No Known Allergies   Social History    Socioeconomic History   Marital status: Married    Spouse name: Not on file   Number of children: Not on file   Years of education: Not on file   Highest education level: Not on file  Occupational History   Not on file  Tobacco Use   Smoking status: Former    Types: Cigarettes   Smokeless tobacco: Never  Substance and Sexual Activity   Alcohol use: Never   Drug use: Never   Sexual activity: Not on file  Other Topics Concern   Not on file  Social History Narrative   Not on file   Social Drivers of Health   Financial Resource Strain: Not on file  Food Insecurity: Patient Unable To Answer (08/05/2024)   Hunger Vital Sign    Worried About Running Out of Food in the Last Year: Patient unable to answer    Ran Out of Food in the Last Year: Patient unable to answer  Recent Concern: Food Insecurity - Food Insecurity Present (07/27/2024)   Hunger Vital Sign    Worried About Running Out of Food in the Last Year: Sometimes true    Ran Out of Food in the Last Year: Never true  Transportation Needs: Patient Unable To Answer (08/05/2024)   PRAPARE - Transportation    Lack of Transportation (Medical): Patient unable to answer    Lack of Transportation (Non-Medical): Patient unable to answer  Physical Activity: Not on file  Stress: Not on file  Social Connections: Unknown (08/05/2024)   Social Connection and Isolation Panel    Frequency of Communication with Friends and Family: Patient unable to answer    Frequency of Social Gatherings with Friends and Family: Patient unable to answer    Attends Religious Services: Patient unable to answer    Active Member of Clubs or Organizations: Patient unable to answer    Attends Banker Meetings: Patient unable to answer    Marital Status: Married  Catering Manager Violence: Patient Unable To Answer (08/05/2024)   Humiliation, Afraid, Rape, and Kick questionnaire    Fear of Current or Ex-Partner: Patient unable to answer     Emotionally Abused: Patient unable to answer    Physically Abused: Patient unable to answer    Sexually Abused: Patient unable to answer    Physical Exam      Future Appointments  Date Time Provider Department Center  09/20/2024  2:00 PM [facility] PA/NP [facility] None  11/09/2024  3:20 PM Anner Alm ORN, MD [facility]  11/22/2024  2:45 PM Inocencio Soyla Lunger, MD [facility]  12/07/2024  7:05 AM [facility] DEVICE REMOTES [facility]  03/08/2025  7:05 AM [facility] DEVICE REMOTES [facility]  06/07/2025  7:05 AM [facility] DEVICE REMOTES [facility]  07/20/2025  1:15 PM [facility] 1 [facility] H&V  07/20/2025  2:45 PM Sebastian Lamarr SAUNDERS, PA-C [facility]  09/06/2025  7:05 AM [facility] DEVICE REMOTES [facility]  12/06/2025  7:05 AM [facility] DEVICE REMOTES [facility]

## 2024-09-19 ENCOUNTER — Telehealth (HOSPITAL_COMMUNITY): Payer: Self-pay

## 2024-09-19 NOTE — Telephone Encounter (Signed)
 Called to confirm/remind patient of their appointment at the [facility] on 09/20/24.   Appointment:   [x] Confirmed  [] Left mess   [] No answer/No voice mail  [] VM Full/unable to leave message  [] Phone not in service  Patient reminded to bring all medications and/or complete list.  Confirmed patient has transportation. Gave directions, instructed to utilize valet parking.

## 2024-09-19 NOTE — Progress Notes (Incomplete)
 [facility] CLINIC NOTE  Primary Care: Nichole Senior, MD Primary Cardiologist: Alm Clay, MD HF Cardiologist: Dr. Rolan  HPI: [name] is a 83 y.o. male w/ HTN, HLD, DM2, CKD IIIa, hypothyroidism, OSA, biopolar disorder and newly diagnosed HFrEF and aortic stenosis.  Admitted 10/25 with new HF symptoms and marked volume overload. Echo showed biventricular failure, LVEF 30-35%, RV mod reduced, global hypokinesis w/ disproportionately severe hypokinesis/akinesis in the mid-apical left anterior and anteroseptal walls and severe AS, AVA 0.72 cm, mean gradient 33 mmHg. Interventricular septum flattened in systole, consistent with right ventricular pressure overload. SCr rose w/ diuresis and low BP limited GDMT. Concern for low output HF. AHF team consulted. Underwent R/LHC with occluded LCx with R>L collaterals, severe LAD disease, mildly elevated filling pressures, normal fick CI (RA 7, PA 69/23 (38), PAPi 6.6, Fick CO/CI 4.67/2.56)). Optimized from AHF standpoint, discharged with plan for scheduled TAVR 07/26/24.   Underwent TAVR 07/26/24 by Dr. Shyrl. 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 EP consulted. Echo showed EF 25-30%, LV with RWMA, GIIDD, RV normal, LA mod dilated, RA mildly dilated, no MR, normal structure/function of AV. Underwent PPM 07/28/24.  Staged PCI, pending dental extractions.   Follow up 10/25 post hospital, been off all meds and mildly volume overloaded. Meds resumed.  Admitted 10/25 with AMS/psychosis. Required 3 day admission to [facility] unit. ECG showed prolonged QTC, risperdol changed to Abilify .  Today he returns for AHF follow up with his son. Overall feeling ok. Denies palpitations, CP, dizziness, or PND/Orthopnea. Has swelling in his legs. SOB with activity. Appetite ok, tries to avoid eating salty foods but eats canned food every now and then. Weight at home 146-153 pounds. Taking all medications. He lost 3-4 lbs with each furoscix  dose last week, overall  down 11lbs based on home weights. He recorded pre and post furoscix  weights!  ReDs reading: 42 %, abnormal   ECG (personally reviewed): none ordered today.  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 Verlin and Dr. Shyrl   Sleep apnea    Current Outpatient Medications  Medication Sig Dispense Refill   amoxicillin  (AMOXIL ) 500 MG capsule Take 4 capsules (2,000 mg total) by mouth as directed 1 hour prior to dental work including cleanings. 12 capsule 12   aspirin  81 MG chewable tablet Chew 1 tablet (81 mg total) by mouth daily. 30 tablet 0   atorvastatin  (LIPITOR ) 80 MG tablet Take 1 tablet (80 mg total) by mouth daily. 30 tablet 1   dapagliflozin  propanediol (FARXIGA ) 10 MG TABS tablet Take 1 tablet (10 mg total) by mouth daily. 90 tablet 2   Furosemide  (FUROSCIX ) 80 MG/10ML CTKT Inject 80 mg into the skin daily as needed (ONLY USE AS DIRECTED BY THE HEART FAILURE CLINIC). 5 each 0  furosemide  (LASIX ) 20 MG tablet Take 2 tablets (40 mg total) by mouth daily. 60 tablet 3   levothyroxine  (SYNTHROID ) 75 MCG tablet Take 1 tablet (75 mcg total) by mouth daily before breakfast. 30 tablet 1   potassium chloride  SA (KLOR-CON  M) 20 MEQ tablet TAKE 2 TABLETS ONCE DAILY ON TUESDAY, WEDNESDAY, AND THURSDAY WITH FUROSCIX   DOSE 30 tablet 0   No current facility-administered medications for this visit.   No Known Allergies  Social History   Socioeconomic History   Marital status: Married    Spouse name: Not on file   Number of children: Not on file   Years of education: Not on file   Highest education level: Not on file  Occupational History   Not on file  Tobacco Use   Smoking status: Former    Types: Cigarettes   Smokeless tobacco: Never  Substance and Sexual Activity   Alcohol use: Never   Drug use: Never   Sexual activity: Not on file  Other Topics Concern   Not on file  Social History Narrative   Not on file   Social Drivers of Health   Financial Resource Strain: Not on file  Food Insecurity: Patient Unable To Answer (08/05/2024)   Hunger Vital Sign    Worried About Running Out of Food in the Last Year: Patient unable to answer    Ran Out of Food in the Last Year: Patient unable to answer  Recent Concern: Food Insecurity - Food Insecurity Present (07/27/2024)   Hunger Vital Sign    Worried About Running Out of Food in the Last Year: Sometimes true    Ran Out of Food in the Last Year: Never true  Transportation Needs: Patient Unable To Answer (08/05/2024)   PRAPARE - Transportation    Lack of Transportation (Medical): Patient unable to answer    Lack of Transportation (Non-Medical): Patient unable to answer  Physical Activity: Not on file  Stress: Not on file  Social Connections: Unknown (08/05/2024)   Social Connection and Isolation Panel    Frequency of Communication with Friends and Family: Patient unable to answer    Frequency of Social Gatherings with Friends and Family: Patient unable to answer    Attends Religious Services: Patient unable to answer    Active Member of Clubs or Organizations: Patient unable to answer    Attends Banker Meetings: Patient unable to answer    Marital Status: Married  Catering Manager Violence: Patient Unable To Answer  (08/05/2024)   Humiliation, Afraid, Rape, and Kick questionnaire    Fear of Current or Ex-Partner: Patient unable to answer    Emotionally Abused: Patient unable to answer    Physically Abused: Patient unable to answer    Sexually Abused: Patient unable to answer   Family History  Problem Relation Age of Onset   CAD Father    Diabetes Mellitus II Maternal Grandmother    Wt Readings from Last 3 Encounters:  09/15/24 67.6 kg (149 lb)  09/13/24 70.9 kg (156 lb 3.2 oz)  09/05/24 73.1 kg (161 lb 3.2 oz)   There were no vitals taken for this visit.  PHYSICAL EXAM: General:  elderly appearing.  No respiratory difficulty. Arrived in [facility].  Neck: JVD ~10 cm.  Cor: Regular rate & rhythm. No murmurs. Lungs: clear Extremities: +1-2 BLE edema  Neuro: alert & oriented x 3. Affect pleasant.   ASSESSMENT & PLAN: 1. Chronic Biventricular Systolic Heart Failure: Recent diagnosis. Echo 07/13/24 LVEF 30-35%, RV mod  reduced, GHK w/ disproportionately severe hypokinesis/akinesis in the mid-apical L anterior and anteroseptal walls and severe AS, AVA 0.72 cm, mean gradient 33 mmHg. Interventricular septum flattened in systole, consistent with RV pressure overload, mod RV dysfunction with mild RV enlargement. 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 RHC, after diuresis, showed mildly elevated filling pressures and normal CI by FICK (RA 7, PCW 18, CI 2.6), LHC showed occluded Lcx with right to left collaterals and severe LAD disease.  - NYHA II-III, functional class confounded by deconditioning and chronic back pain. Remains volume overloaded but improved from last week. REDs 43%. GDMT has been limited by patient's unwillingness to add medication. - Will repeat Furoscix  + 40 KCL daily x 3 days, hold Lasix  while using Furoscix . BMET, BNP and Mag today  - After 3 days, restart Lasix  40 mg daily. - Continue Farxiga  10 mg daily.  - Plan to add GDMT if he is agreeable. 2. Aortic  Stenosis: severe by echo, AVA by VTI measured at 72 cm, mean gradient 33 mmHg. - S/p successful TAVR with a 26 mm Edwards Sapien 3 Ultra Resilia THV via the TF approach on 07/26/24.  - Post operative echo showed EF 25-30%, mild RVE, normally functioning TAVR with a mean gradient of 3 mmHg and no PVL  - Follows with Structural Heart Team 3. CAD: LHC 10/25 with severe LAD disease and occluded LCx w/ R>>L collaterals. Plan for staged PCI post dental extractions (has f/u with dentist tomorrow but will reschedule for next week as he's still volume overloaded) No chest pain - Continue atorvastatin  80 mg daily.  - Continue ASA 81. 4. CKD IIIa: Baseline SCr 1.3  - Continue Farxiga . BMET today. 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  6. Hypothyroidism: - Continue levothyroxine . 7. SDOH: Lives with wife, son lives next door and helps out. Behavioral/psych disturbances complicate medication adherence. ? Dementia. - Paramedicine now on board. - Has upcoming appt with PCP.   Follow up   [facility], Ica Daye N PA-C 09/19/24

## 2024-09-20 ENCOUNTER — Ambulatory Visit (HOSPITAL_COMMUNITY): Payer: Self-pay | Admitting: Physician Assistant

## 2024-09-20 ENCOUNTER — Encounter (HOSPITAL_COMMUNITY): Payer: Self-pay

## 2024-09-20 ENCOUNTER — Other Ambulatory Visit (HOSPITAL_COMMUNITY): Payer: Self-pay

## 2024-09-20 ENCOUNTER — Ambulatory Visit (HOSPITAL_COMMUNITY)
Admission: RE | Admit: 2024-09-20 | Discharge: 2024-09-20 | Disposition: A | Discharge status: Home / Self Care | Source: Ambulatory Visit | Attending: Physician Assistant | Admitting: Physician Assistant

## 2024-09-20 VITALS — BP 118/62 | HR 93 | Ht 66.0 in | Wt 154.8 lb

## 2024-09-20 DIAGNOSIS — I5022 Chronic systolic (congestive) heart failure: Secondary | ICD-10-CM | POA: Diagnosis not present

## 2024-09-20 DIAGNOSIS — I5042 Chronic combined systolic (congestive) and diastolic (congestive) heart failure: Secondary | ICD-10-CM | POA: Insufficient documentation

## 2024-09-20 DIAGNOSIS — I2510 Atherosclerotic heart disease of native coronary artery without angina pectoris: Secondary | ICD-10-CM | POA: Diagnosis not present

## 2024-09-20 DIAGNOSIS — I350 Nonrheumatic aortic (valve) stenosis: Secondary | ICD-10-CM

## 2024-09-20 DIAGNOSIS — I442 Atrioventricular block, complete: Secondary | ICD-10-CM

## 2024-09-20 LAB — BASIC METABOLIC PANEL WITH GFR
Anion gap: 11 (ref 5–15)
BUN: 46 mg/dL — ABNORMAL HIGH (ref 8–23)
CO2: 24 mmol/L (ref 22–32)
Calcium: 9 mg/dL (ref 8.9–10.3)
Chloride: 101 mmol/L (ref 98–111)
Creatinine, Ser: 1.33 mg/dL — ABNORMAL HIGH (ref 0.61–1.24)
GFR, Estimated: 53 mL/min — ABNORMAL LOW (ref >60)
Glucose, Bld: 109 mg/dL — ABNORMAL HIGH (ref 70–99)
Potassium: 4.3 mmol/L (ref 3.5–5.1)
Sodium: 136 mmol/L (ref 135–145)

## 2024-09-20 LAB — BRAIN NATRIURETIC PEPTIDE: B Natriuretic Peptide: 1553.3 pg/mL — ABNORMAL HIGH (ref 0.0–100.0)

## 2024-09-20 MED ORDER — POTASSIUM CHLORIDE CRYS ER 20 MEQ PO TBCR
40.0000 meq | EXTENDED_RELEASE_TABLET | Freq: Every day | ORAL | 3 refills | Status: DC
  Filled 2024-09-20: qty 180, 90d supply, fill #0

## 2024-09-20 MED ORDER — FUROSEMIDE 40 MG PO TABS
80.0000 mg | ORAL_TABLET | Freq: Every day | ORAL | 3 refills | Status: AC
  Filled 2024-09-20: qty 180, 90d supply, fill #0

## 2024-09-20 NOTE — Patient Instructions (Addendum)
 Thank you for coming in today  If you had labs drawn today, any labs that are abnormal the clinic will call you No news is good news  Medications: Furoscix  kit use this afternoon 09/20/2024 with Potassium 40 meq this afternoon  Starting 09/21/2024 Increase Lasix  to 80 mg 2 tablets daily with Potassium 40 meq 2 tablets daily  Follow up appointments:  Your physician recommends that you schedule a follow-up appointment in:  1-2 weeks in clinic    Do the following things EVERYDAY: Weigh yourself in the morning before breakfast. Write it down and keep it in a log. Take your medicines as prescribed Eat low salt foods--Limit salt (sodium) to 2000 mg per day.  Stay as active as you can everyday Limit all fluids for the day to less than 2 liters   At the [facility], you and your health needs are our priority. As part of our continuing mission to provide you with exceptional heart care, we have created designated Provider Care Teams. These Care Teams include your primary Cardiologist (physician) and Advanced Practice Providers (APPs- Physician Assistants and Nurse Practitioners) who all work together to provide you with the care you need, when you need it.   You may see any of the following providers on your designated Care Team at your next follow up: Dr Toribio Fuel Dr [name] Dr. Ria Gardenia Greig Lenetta, NP Caffie Shed, [place] [facility] [place], [place] Beckey Coe, NP Tinnie Redman, PharmD   Please be sure to bring in all your medications bottles to every appointment.    Thank you for choosing [facility]  If you have any questions or concerns before your next appointment please send us  a message through [place] or call our office at [phone].    TO LEAVE A MESSAGE FOR THE NURSE SELECT OPTION 2, PLEASE LEAVE A MESSAGE INCLUDING: YOUR NAME DATE OF BIRTH CALL BACK NUMBER REASON FOR CALL**this is  important as we prioritize the call backs  YOU WILL RECEIVE A CALL BACK THE SAME DAY AS LONG AS YOU CALL BEFORE 4:00 PM

## 2024-09-22 ENCOUNTER — Other Ambulatory Visit (HOSPITAL_COMMUNITY): Payer: Self-pay

## 2024-09-22 NOTE — Progress Notes (Signed)
 Paramedicine Encounter    Patient ID: [name], male    DOB: November 25, 1940, 83 y.o.   MRN: [id number]   Complaints- none   Assessment- CAOX4, warm and dry out side on the steps sweeping on my arrival, no shortness of breath on ambulation, no lower leg edema, weight is down 4 lbs, no chest pain, no dizziness, lungs clear.   Compliance with meds- no missed doses.  -DID NOT TAKE FUROSCIX  AFTER HF CLINIC VISIT EARLIER THIS WEEK BUT HAS IMPROVED. I TALKED TO L. [facility] NP AND SHE AGREED HE DID NOT NEED TO TAKE SINCE IMPROVING WITH LASIX  INCREASE AND COMPLIANCE.   Pill box filled- one week   Refills needed- none   Meds changes since last visit- Lasix  80mg  daily. Potassium 40 daily.     Social changes- Saw PCP yesterday A1C 5.3 as of yesterday.    VISIT SUMMARY- Arrived for home visit for [name] who was feeling well outside sweeping his porch. He denied shortness of breath, denied chest pain, denied dizziness, edema improved and weight down 4 lbs. Lungs clear. Vitals obtained. Meds reviewed and pill box filled for one week. He did not take the Furoscix  on Tuesday but has improved with increased lasix  I talked to L. Colletta NP in clinic today and made her aware she was okay with him continuing to take PO lasix  dose. He will be seen in clinic next week on Tues. I will follow up in the home Weds or Thurs. He agreed. Appointments reviewed and confirmed and wrote down. Home visit complete.   BP 102/68   Pulse (!) 58   Resp 16   Wt 150 lb (68 kg)   SpO2 92%   BMI 24.21 kg/m  Weight yesterday-- 152lbs  Last visit weight-- 154lbs      ACTION: Home visit completed     Patient Care Team: Nichole Senior, MD as PCP - General (Endocrinology) Anner Alm ORN, MD as PCP - Cardiology (Cardiology) Verlin Lonni BIRCH, MD as PCP - Structural Heart (Cardiology) Inocencio Soyla Lunger, MD as PCP - Electrophysiology (Cardiology)  Patient Active Problem List   Diagnosis Date Noted   Chronic  combined systolic and diastolic heart failure (HCC) 09/20/2024   Psychosis, unspecified psychosis type (HCC) 08/05/2024   Psychosis (HCC) 08/04/2024   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/26/2024   Coronary artery disease 07/19/2024   Biventricular heart failure, NYHA class 3 (HCC) 07/14/2024   Symptomatic severe aortic stenosis with low ejection fraction 07/14/2024   Acquired hypothyroidism 07/12/2024   Stage 3a chronic kidney disease (HCC) 07/12/2024   Acute on chronic systolic CHF (congestive heart failure) (HCC) 07/12/2024   Hyponatremia 02/09/2022   Essential hypertension 02/09/2022   Obesity 12/29/2011   Type 2 diabetes mellitus with hyperlipidemia (HCC) 04/15/2011   Fatty (change of) liver, not elsewhere classified 07/16/2010   Bipolar disorder (HCC) 08/30/2009   Atherosclerotic heart disease of native coronary artery without angina pectoris 08/30/2009   Hyperlipidemia 08/30/2009   BPH (benign prostatic hyperplasia) 08/30/2009   Sleep apnea 08/30/2009   GERD 04/24/2008   FLATULENCE ERUCTATION AND GAS PAIN 04/24/2008   Current Medications[1] Allergies[2]   Social History   Socioeconomic History   Marital status: Married    Spouse name: Not on file   Number of children: Not on file   Years of education: Not on file   Highest education level: Not on file  Occupational History   Not on file  Tobacco Use   Smoking status: Former  Types: Cigarettes   Smokeless tobacco: Never  Substance and Sexual Activity   Alcohol use: Never   Drug use: Never   Sexual activity: Not on file  Other Topics Concern   Not on file  Social History Narrative   Not on file   Social Drivers of Health   Tobacco Use: Medium Risk (09/20/2024)   Patient History    Smoking Tobacco Use: Former    Smokeless Tobacco Use: Never    Passive Exposure: Not on file  Financial Resource Strain: Not on file  Food Insecurity: Patient Unable To Answer (08/05/2024)   Epic    Worried  About Programme Researcher, Broadcasting/film/video in the Last Year: Patient unable to answer    Ran Out of Food in the Last Year: Patient unable to answer  Recent Concern: Food Insecurity - Food Insecurity Present (07/27/2024)   Epic    Worried About Programme Researcher, Broadcasting/film/video in the Last Year: Sometimes true    Ran Out of Food in the Last Year: Never true  Transportation Needs: Patient Unable To Answer (08/05/2024)   Epic    Lack of Transportation (Medical): Patient unable to answer    Lack of Transportation (Non-Medical): Patient unable to answer  Physical Activity: Not on file  Stress: Not on file  Social Connections: Unknown (08/05/2024)   Social Connection and Isolation Panel    Frequency of Communication with Friends and Family: Patient unable to answer    Frequency of Social Gatherings with Friends and Family: Patient unable to answer    Attends Religious Services: Patient unable to answer    Active Member of Clubs or Organizations: Patient unable to answer    Attends Banker Meetings: Patient unable to answer    Marital Status: Married  Catering Manager Violence: Patient Unable To Answer (08/05/2024)   Epic    Fear of Current or Ex-Partner: Patient unable to answer    Emotionally Abused: Patient unable to answer    Physically Abused: Patient unable to answer    Sexually Abused: Patient unable to answer  Depression (EYV7-0): Not on file  Alcohol Screen: Low Risk (08/05/2024)   Alcohol Screen    Last Alcohol Screening Score (AUDIT): 0  Housing: Unknown (08/05/2024)   Epic    Unable to Pay for Housing in the Last Year: Patient unable to answer    Number of Times Moved in the Last Year: 0    Homeless in the Last Year: Patient unable to answer  Utilities: Patient Unable To Answer (08/05/2024)   Epic    Threatened with loss of utilities: Patient unable to answer  Health Literacy: Not on file    Physical Exam      Future Appointments  Date Time Provider Department Center  09/27/2024   2:30 PM [facility] PA/NP [facility] None  11/09/2024  3:20 PM Anner Alm ORN, MD [facility]  11/22/2024  2:45 PM Inocencio Soyla Lunger, MD [facility]  12/07/2024  7:05 AM [facility] DEVICE REMOTES [facility]  03/08/2025  7:05 AM [facility] DEVICE REMOTES [facility]  06/07/2025  7:05 AM [facility] DEVICE REMOTES [facility]  07/20/2025  1:15 PM [facility] 1 [facility] H&V  07/20/2025  2:45 PM Sebastian Lamarr SAUNDERS, PA-C [facility]  09/06/2025  7:05 AM [facility] DEVICE REMOTES [facility]  12/06/2025  7:05 AM [facility] DEVICE REMOTES [facility]                 [1]  Current  Outpatient Medications:    amoxicillin  (AMOXIL ) 500 MG capsule, Take 4 capsules (2,000 mg total) by mouth as directed 1 hour prior to dental work including cleanings., Disp: 12 capsule, Rfl: 12   aspirin  81 MG chewable tablet, Chew 1 tablet (81 mg total) by mouth daily., Disp: 30 tablet, Rfl: 0   atorvastatin  (LIPITOR ) 80 MG tablet, Take 1 tablet (80 mg total) by mouth daily., Disp: 30 tablet, Rfl: 1   dapagliflozin  propanediol (FARXIGA ) 10 MG TABS tablet, Take 1 tablet (10 mg total) by mouth daily., Disp: 90 tablet, Rfl: 2   Furosemide  (FUROSCIX ) 80 MG/10ML CTKT, Inject 80 mg into the skin daily as needed (ONLY USE AS DIRECTED BY THE HEART FAILURE CLINIC)., Disp: 5 each, Rfl: 0   furosemide  (LASIX ) 40 MG tablet, Take 2 tablets (80 mg total) by mouth daily., Disp: 180 tablet, Rfl: 3   levothyroxine  (SYNTHROID ) 75 MCG tablet, Take 1 tablet (75 mcg total) by mouth daily before breakfast., Disp: 30 tablet, Rfl: 1   potassium chloride  SA (KLOR-CON  M) 20 MEQ tablet, Take 2 tablets (40 mEq total) by mouth daily., Disp: 180 tablet, Rfl: 3 [2] No Known Allergies

## 2024-09-27 ENCOUNTER — Ambulatory Visit (HOSPITAL_COMMUNITY): Admission: RE | Admit: 2024-09-27 | Discharge: 2024-09-27 | Attending: Cardiology

## 2024-09-27 ENCOUNTER — Other Ambulatory Visit (HOSPITAL_COMMUNITY): Payer: Self-pay

## 2024-09-27 ENCOUNTER — Encounter (HOSPITAL_COMMUNITY): Payer: Self-pay

## 2024-09-27 ENCOUNTER — Ambulatory Visit (HOSPITAL_COMMUNITY): Payer: Self-pay | Admitting: Cardiology

## 2024-09-27 VITALS — BP 114/70 | HR 63 | Ht 66.0 in | Wt 150.6 lb

## 2024-09-27 DIAGNOSIS — Z79890 Hormone replacement therapy (postmenopausal): Secondary | ICD-10-CM | POA: Diagnosis not present

## 2024-09-27 DIAGNOSIS — F319 Bipolar disorder, unspecified: Secondary | ICD-10-CM | POA: Insufficient documentation

## 2024-09-27 DIAGNOSIS — E785 Hyperlipidemia, unspecified: Secondary | ICD-10-CM | POA: Insufficient documentation

## 2024-09-27 DIAGNOSIS — I442 Atrioventricular block, complete: Secondary | ICD-10-CM | POA: Insufficient documentation

## 2024-09-27 DIAGNOSIS — Z7984 Long term (current) use of oral hypoglycemic drugs: Secondary | ICD-10-CM | POA: Insufficient documentation

## 2024-09-27 DIAGNOSIS — Z79899 Other long term (current) drug therapy: Secondary | ICD-10-CM | POA: Diagnosis not present

## 2024-09-27 DIAGNOSIS — Z7982 Long term (current) use of aspirin: Secondary | ICD-10-CM | POA: Insufficient documentation

## 2024-09-27 DIAGNOSIS — G4733 Obstructive sleep apnea (adult) (pediatric): Secondary | ICD-10-CM | POA: Insufficient documentation

## 2024-09-27 DIAGNOSIS — I5042 Chronic combined systolic (congestive) and diastolic (congestive) heart failure: Secondary | ICD-10-CM

## 2024-09-27 DIAGNOSIS — E039 Hypothyroidism, unspecified: Secondary | ICD-10-CM | POA: Diagnosis not present

## 2024-09-27 DIAGNOSIS — I130 Hypertensive heart and chronic kidney disease with heart failure and stage 1 through stage 4 chronic kidney disease, or unspecified chronic kidney disease: Secondary | ICD-10-CM | POA: Insufficient documentation

## 2024-09-27 DIAGNOSIS — Z87891 Personal history of nicotine dependence: Secondary | ICD-10-CM | POA: Insufficient documentation

## 2024-09-27 DIAGNOSIS — I350 Nonrheumatic aortic (valve) stenosis: Secondary | ICD-10-CM | POA: Diagnosis not present

## 2024-09-27 DIAGNOSIS — N1831 Chronic kidney disease, stage 3a: Secondary | ICD-10-CM | POA: Insufficient documentation

## 2024-09-27 DIAGNOSIS — Z833 Family history of diabetes mellitus: Secondary | ICD-10-CM | POA: Insufficient documentation

## 2024-09-27 DIAGNOSIS — E1122 Type 2 diabetes mellitus with diabetic chronic kidney disease: Secondary | ICD-10-CM | POA: Diagnosis not present

## 2024-09-27 DIAGNOSIS — I5082 Biventricular heart failure: Secondary | ICD-10-CM | POA: Diagnosis not present

## 2024-09-27 DIAGNOSIS — Z8249 Family history of ischemic heart disease and other diseases of the circulatory system: Secondary | ICD-10-CM | POA: Diagnosis not present

## 2024-09-27 DIAGNOSIS — I5022 Chronic systolic (congestive) heart failure: Secondary | ICD-10-CM | POA: Insufficient documentation

## 2024-09-27 DIAGNOSIS — I2510 Atherosclerotic heart disease of native coronary artery without angina pectoris: Secondary | ICD-10-CM | POA: Insufficient documentation

## 2024-09-27 LAB — BASIC METABOLIC PANEL WITH GFR
Anion gap: 11 (ref 5–15)
BUN: 37 mg/dL — ABNORMAL HIGH (ref 8–23)
CO2: 26 mmol/L (ref 22–32)
Calcium: 9.3 mg/dL (ref 8.9–10.3)
Chloride: 97 mmol/L — ABNORMAL LOW (ref 98–111)
Creatinine, Ser: 1.38 mg/dL — ABNORMAL HIGH (ref 0.61–1.24)
GFR, Estimated: 50 mL/min — ABNORMAL LOW (ref >60)
Glucose, Bld: 127 mg/dL — ABNORMAL HIGH (ref 70–99)
Potassium: 5.4 mmol/L — ABNORMAL HIGH (ref 3.5–5.1)
Sodium: 134 mmol/L — ABNORMAL LOW (ref 135–145)

## 2024-09-27 LAB — PRO BRAIN NATRIURETIC PEPTIDE: Pro Brain Natriuretic Peptide: 10434 pg/mL — ABNORMAL HIGH (ref <300.0)

## 2024-09-27 MED ORDER — SPIRONOLACTONE 25 MG PO TABS: 12.5000 mg | ORAL_TABLET | Freq: Every day | ORAL | 3 refills | Status: AC

## 2024-09-27 NOTE — Patient Instructions (Addendum)
 Medication Changes:  START SPIRONOLACTONE  12.5MG  ONCE DAILY   Lab Work:  Labs done today, your results will be available in MyChart, we will contact you for abnormal readings.  Follow-Up in: FOLLOW UP WITH DR. HARDING'S OFFICE AS SCHEDULED IN JANUARY   AND THEN PHARMACY VISIT IN AROUND 4 WEEKS AS SCHEDULED HERE AT OUR OFFICE   AND THEN WITH DR. ROLAN IN 2 MONTHS AS SCHEDULED   At the [facility], you and your health needs are our priority. We have a designated team specialized in the treatment of Heart Failure. This Care Team includes your primary Heart Failure Specialized Cardiologist (physician), Advanced Practice Providers (APPs- Physician Assistants and Nurse Practitioners), and Pharmacist who all work together to provide you with the care you need, when you need it.   You may see any of the following providers on your designated Care Team at your next follow up:  Dr. Toribio Fuel Dr. Ezra Rolan Dr. Odis Brownie Greig Mosses, NP Caffie Shed, [place] [facility] [place], [place] Beckey Coe, NP Jordan Lee, NP Tinnie Redman, PharmD   Please be sure to bring in all your medications bottles to every appointment.   Need to Contact Us :  If you have any questions or concerns before your next appointment please send us  a message through Kell or call our office at [phone].    TO LEAVE A MESSAGE FOR THE NURSE SELECT OPTION 2, PLEASE LEAVE A MESSAGE INCLUDING: YOUR NAME DATE OF BIRTH CALL BACK NUMBER REASON FOR CALL**this is important as we prioritize the call backs  YOU WILL RECEIVE A CALL BACK THE SAME DAY AS LONG AS YOU CALL BEFORE 4:00 PM

## 2024-09-27 NOTE — Progress Notes (Signed)
 ADVANCED HF CLINIC NOTE  Primary Care: Nichole Senior, MD Primary Cardiologist: Alm Clay, MD HF Cardiologist: Dr. Rolan  Reason for visit: F/u for systolic heart failure   HPI: [name] is a 83 y.o. male w/ HTN, HLD, DM2, CKD IIIa, hypothyroidism, OSA, biopolar disorder and newly diagnosed HFrEF and aortic stenosis.  Admitted 10/25 with new HF symptoms and marked volume overload. Echo showed biventricular failure, LVEF 30-35%, RV mod reduced, global hypokinesis w/ disproportionately severe hypokinesis/akinesis in the mid-apical left anterior and anteroseptal walls and severe AS, AVA 0.72 cm, mean gradient 33 mmHg. Interventricular septum flattened in systole, consistent with right ventricular pressure overload. SCr rose w/ diuresis and low BP limited GDMT. Concern for low output HF. AHF team consulted. Underwent R/LHC with occluded LCx with R>L collaterals, severe LAD disease, mildly elevated filling pressures, normal fick CI (RA 7, PA 69/23 (38), PAPi 6.6, Fick CO/CI 4.67/2.56)). Optimized from AHF standpoint, discharged with plan for scheduled TAVR 07/26/24.   Underwent TAVR 07/26/24 by Dr. Shyrl. 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 EP consulted. Echo showed EF 25-30%, LV with RWMA, GIIDD, RV normal, LA mod dilated, RA mildly dilated, no MR, normal structure/function of AV. Underwent PPM 07/28/24.  Staged PCI, pending dental extractions.   Follow up 10/25 post hospital, been off all meds and mildly volume overloaded. Meds resumed.  Admitted 10/25 with AMS/psychosis. Required 3 day admission to [facility] unit. ECG showed prolonged QTC, risperdol changed to Abilify .  He presents today for f/u. At last visit, he was volume overloaded and diuretics adjusted. ReDs was 44%.   He is here today w/ his son. Feeling better. Wt down 4 lb. ReDs improving but still mildly elevated at 37%. BP well controlled. LEE much improved. No resting dyspnea. NYHA Class II.   ReDs reading: 37%,  abnormal but improving    ECG (personally reviewed): not performed today   Cardiac Studies - Echo 07/27/24: EF 25-30%, G2DD, RV normal, stable TAVR - Ltd echo 07/26/24: stable TAVR, EF 25-30%, RV moderately reduced - R/LHC 10/25: 100% pCx lesion, 90% mLAD lesion; RA 8, PA 65/23 (38), PCWP 18 with v waves to 26, CO/CI (Fick) 4.67/2.56 - Echo 07/13/24: EF 30-35%, G2DD, interventricular septum flattened in systole, RV moderately reduced  Past Medical History:  Diagnosis Date   Aortic stenosis, severe    Diabetes mellitus without complication (HCC)    Hypertension    Hypothyroidism    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 Verlin and Dr. Shyrl   Sleep apnea    Current Outpatient Medications  Medication Sig Dispense Refill   amoxicillin  (AMOXIL ) 500 MG capsule Take 4 capsules (2,000 mg total) by mouth as directed 1 hour prior to dental work including cleanings. 12 capsule 12   aspirin  81 MG chewable tablet Chew 1 tablet (81 mg total) by mouth daily. 30 tablet 0   atorvastatin  (LIPITOR ) 80 MG tablet Take 1 tablet (80 mg total) by mouth daily. 30 tablet 1   dapagliflozin  propanediol (FARXIGA ) 10 MG TABS tablet Take 1 tablet (10 mg total) by mouth daily. 90 tablet 2   Furosemide  (FUROSCIX ) 80 MG/10ML CTKT Inject 80 mg into the skin daily as needed (ONLY USE AS DIRECTED BY THE HEART FAILURE CLINIC). 5 each 0   furosemide  (LASIX ) 40 MG tablet Take 2 tablets (80 mg total) by mouth daily. 180 tablet 3  levothyroxine  (SYNTHROID ) 75 MCG tablet Take 1 tablet (75 mcg total) by mouth daily before breakfast. 30 tablet 1   potassium chloride  SA (KLOR-CON  M) 20 MEQ tablet Take 2 tablets (40 mEq total) by mouth daily. 180 tablet 3   spironolactone  (ALDACTONE ) 25 MG tablet Take 0.5 tablets (12.5 mg total) by mouth daily. 45 tablet 3   No current facility-administered medications for this  encounter.   No Known Allergies  Social History   Socioeconomic History   Marital status: Married    Spouse name: Not on file   Number of children: Not on file   Years of education: Not on file   Highest education level: Not on file  Occupational History   Not on file  Tobacco Use   Smoking status: Former    Types: Cigarettes   Smokeless tobacco: Never  Substance and Sexual Activity   Alcohol use: Never   Drug use: Never   Sexual activity: Not on file  Other Topics Concern   Not on file  Social History Narrative   Not on file   Social Drivers of Health   Tobacco Use: Medium Risk (09/27/2024)   Patient History    Smoking Tobacco Use: Former    Smokeless Tobacco Use: Never    Passive Exposure: Not on Actuary Strain: Not on file  Food Insecurity: Patient Unable To Answer (08/05/2024)   Epic    Worried About Programme Researcher, Broadcasting/film/video in the Last Year: Patient unable to answer    Ran Out of Food in the Last Year: Patient unable to answer  Recent Concern: Food Insecurity - Food Insecurity Present (07/27/2024)   Epic    Worried About Programme Researcher, Broadcasting/film/video in the Last Year: Sometimes true    Ran Out of Food in the Last Year: Never true  Transportation Needs: Patient Unable To Answer (08/05/2024)   Epic    Lack of Transportation (Medical): Patient unable to answer    Lack of Transportation (Non-Medical): Patient unable to answer  Physical Activity: Not on file  Stress: Not on file  Social Connections: Unknown (08/05/2024)   Social Connection and Isolation Panel    Frequency of Communication with Friends and Family: Patient unable to answer    Frequency of Social Gatherings with Friends and Family: Patient unable to answer    Attends Religious Services: Patient unable to answer    Active Member of Clubs or Organizations: Patient unable to answer    Attends Banker Meetings: Patient unable to answer    Marital Status: Married  Catering Manager  Violence: Patient Unable To Answer (08/05/2024)   Epic    Fear of Current or Ex-Partner: Patient unable to answer    Emotionally Abused: Patient unable to answer    Physically Abused: Patient unable to answer    Sexually Abused: Patient unable to answer  Depression (EYV7-0): Not on file  Alcohol Screen: Low Risk (08/05/2024)   Alcohol Screen    Last Alcohol Screening Score (AUDIT): 0  Housing: Unknown (08/05/2024)   Epic    Unable to Pay for Housing in the Last Year: Patient unable to answer    Number of Times Moved in the Last Year: 0    Homeless in the Last Year: Patient unable to answer  Utilities: Patient Unable To Answer (08/05/2024)   Epic    Threatened with loss of utilities: Patient unable to answer  Health Literacy: Not on file   Family History  Problem Relation  Age of Onset   CAD Father    Diabetes Mellitus II Maternal Grandmother    Wt Readings from Last 3 Encounters:  09/27/24 68.3 kg (150 lb 9.6 oz)  09/22/24 68 kg (150 lb)  09/20/24 70.2 kg (154 lb 12.8 oz)   BP 114/70   Pulse 63   Ht 5' 6 (1.676 m)   Wt 68.3 kg (150 lb 9.6 oz)   SpO2 96%   BMI 24.31 kg/m   PHYSICAL EXAM: ReDs: 37%, elevated/ abnormal  GENERAL: NAD Lungs- clear  CARDIAC:  JVP not elevated          Normal rate with regular rhythm. No MRG. Trace b/l LEE  ABDOMEN: Soft, non-tender, non-distended.  EXTREMITIES: Warm and well perfused.  NEUROLOGIC: No obvious FND     ASSESSMENT & PLAN: 1. Chronic Biventricular Systolic Heart Failure: Recent diagnosis. Echo 07/13/24 LVEF 30-35%, RV mod reduced, GHK w/ disproportionately severe hypokinesis/akinesis in the mid-apical L anterior and anteroseptal walls and severe AS, AVA 0.72 cm, mean gradient 33 mmHg. Interventricular septum flattened in systole, consistent with RV pressure overload, mod RV dysfunction with mild RV enlargement. 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 RHC, after diuresis, showed mildly elevated  filling pressures and normal CI by FICK (RA 7, PCW 18, CI 2.6), LHC showed occluded Lcx with right to left collaterals and severe LAD disease. Echo 11/25: EF 30-35%, RV low normal, severely elevated RVSP, severe BAE, moderate to severe MAC, AV prosthesis okay, dilated IVC - NYHA Class II. Volume status improving. Still w/ mild volume overload on exam  - continue Lasix  80 mg daily  - start spironolactone  12.5 mg daily  - Continue Farxiga  10 mg daily.  - Check BMP and BNP today  2. Aortic Stenosis: severe by echo, AVA by VTI measured at 72 cm, mean gradient 33 mmHg. - S/p successful TAVR with a 26 mm Edwards Sapien 3 Ultra Resilia THV via the TF approach on 07/26/24.  - Post procedure echo showed EF 25-30%, mild RVE, normally functioning TAVR with a mean gradient of 3 mmHg and no PVL  - Echo 11/25 stable - Needs SBE prophylaxis with Amoxicillin  prior to dental extractions  - Follows with Structural Heart Team 3. CAD: LHC 10/25 with severe LAD disease and occluded LCx w/ R>>L collaterals. Plan for staged PCI post dental extractions (he has had to post-pone extractions until volume stabilized). Denies CP  - Continue atorvastatin  80 mg daily.  - Continue ASA 81. - Scheduled w/ Dr. Anner 11/09/24 to discuss PCI  4. CKD IIIa: Baseline SCr 1.3  - Continue Farxiga .  - Check BMP today  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  6. Hypothyroidism: - Continue levothyroxine . 7. SDOH: Lives with wife, son lives next door and helps out. Behavioral/psych disturbances complicate medication adherence. ? Dementia. - Paramedicine now on board. Appreciate assistance   F/u w/ PharmD in 3-4 wks for further GDMT titration. F/u Dr. Rolan in 2 months   Hymie Gorr Marcine PA-C 09/27/2024

## 2024-09-29 ENCOUNTER — Other Ambulatory Visit (HOSPITAL_COMMUNITY): Payer: Self-pay

## 2024-09-29 ENCOUNTER — Telehealth (HOSPITAL_COMMUNITY): Payer: Self-pay

## 2024-09-29 MED ORDER — ATORVASTATIN CALCIUM 80 MG PO TABS: 80.0000 mg | ORAL_TABLET | Freq: Every day | ORAL | 3 refills | Status: DC

## 2024-09-29 NOTE — Progress Notes (Signed)
 Paramedicine Encounter    Patient ID: [name], male    DOB: August 12, 1941, 83 y.o.   MRN: [id number]   Complaints- no complaints   Assessment- CAOx4, warm and dry ambulatory without shortness of breath, minimal lower leg edema, weight is down, lungs clear.   Compliance with meds- complaint with meds   Pill box filled- for two weeks   Refills needed- atorvastatin    Meds changes since last visit- stopped potassium today, added spiro 12.5mg  on 12/16.    Social changes- none    VISIT SUMMARY- Arrived for home paramedicine visit for [name] who reports to be feeling well with no complaints today. He says he is doing fine. He denied any complaints. Minimal swelling noted. Lungs clear, weight down 5 lbs. He is med compliant. We reviewed meds and notes and filled pill box for two weeks today. He needs his lipitor  sent to [facility] pharmacy- I sent a note to triage for same. I reviewed upcoming appointments and scheduled labs with [name] on the phone during visit. Home paramedicine visit complete. I plan to see him in two weeks. On 12/31. He agreed.   Wt 145 lb 3.2 oz (65.9 kg)   BMI 23.44 kg/m  Weight yesterday-- 150lbs  Last visit weight-- 150lbs     ACTION: Home visit completed     Patient Care Team: Nichole Senior, MD as PCP - General (Endocrinology) Anner Alm ORN, MD as PCP - Cardiology (Cardiology) Verlin Lonni BIRCH, MD as PCP - Structural Heart (Cardiology) Inocencio Soyla Lunger, MD as PCP - Electrophysiology (Cardiology)  Patient Active Problem List   Diagnosis Date Noted   Chronic combined systolic and diastolic heart failure (HCC) 09/20/2024   Psychosis, unspecified psychosis type (HCC) 08/05/2024   Psychosis (HCC) 08/04/2024   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/26/2024   Coronary artery disease 07/19/2024   Biventricular heart failure, NYHA class 3 (HCC) 07/14/2024   Symptomatic severe aortic stenosis with low ejection fraction  07/14/2024   Acquired hypothyroidism 07/12/2024   Stage 3a chronic kidney disease (HCC) 07/12/2024   Acute on chronic systolic CHF (congestive heart failure) (HCC) 07/12/2024   Hyponatremia 02/09/2022   Essential hypertension 02/09/2022   Obesity 12/29/2011   Type 2 diabetes mellitus with hyperlipidemia (HCC) 04/15/2011   Fatty (change of) liver, not elsewhere classified 07/16/2010   Bipolar disorder (HCC) 08/30/2009   Atherosclerotic heart disease of native coronary artery without angina pectoris 08/30/2009   Hyperlipidemia 08/30/2009   BPH (benign prostatic hyperplasia) 08/30/2009   Sleep apnea 08/30/2009   GERD 04/24/2008   FLATULENCE ERUCTATION AND GAS PAIN 04/24/2008   Current Medications[1] Allergies[2]   Social History   Socioeconomic History   Marital status: Married    Spouse name: Not on file   Number of children: Not on file   Years of education: Not on file   Highest education level: Not on file  Occupational History   Not on file  Tobacco Use   Smoking status: Former    Types: Cigarettes   Smokeless tobacco: Never  Substance and Sexual Activity   Alcohol use: Never   Drug use: Never   Sexual activity: Not on file  Other Topics Concern   Not on file  Social History Narrative   Not on file   Social Drivers of Health   Tobacco Use: Medium Risk (09/27/2024)   Patient History    Smoking Tobacco Use: Former    Smokeless Tobacco Use: Never    Passive Exposure: Not on  file  Financial Resource Strain: Not on file  Food Insecurity: Patient Unable To Answer (08/05/2024)   Epic    Worried About Programme Researcher, Broadcasting/film/video in the Last Year: Patient unable to answer    Ran Out of Food in the Last Year: Patient unable to answer  Recent Concern: Food Insecurity - Food Insecurity Present (07/27/2024)   Epic    Worried About Programme Researcher, Broadcasting/film/video in the Last Year: Sometimes true    Ran Out of Food in the Last Year: Never true  Transportation Needs: Patient Unable To  Answer (08/05/2024)   Epic    Lack of Transportation (Medical): Patient unable to answer    Lack of Transportation (Non-Medical): Patient unable to answer  Physical Activity: Not on file  Stress: Not on file  Social Connections: Unknown (08/05/2024)   Social Connection and Isolation Panel    Frequency of Communication with Friends and Family: Patient unable to answer    Frequency of Social Gatherings with Friends and Family: Patient unable to answer    Attends Religious Services: Patient unable to answer    Active Member of Clubs or Organizations: Patient unable to answer    Attends Banker Meetings: Patient unable to answer    Marital Status: Married  Catering Manager Violence: Patient Unable To Answer (08/05/2024)   Epic    Fear of Current or Ex-Partner: Patient unable to answer    Emotionally Abused: Patient unable to answer    Physically Abused: Patient unable to answer    Sexually Abused: Patient unable to answer  Depression (EYV7-0): Not on file  Alcohol Screen: Low Risk (08/05/2024)   Alcohol Screen    Last Alcohol Screening Score (AUDIT): 0  Housing: Unknown (08/05/2024)   Epic    Unable to Pay for Housing in the Last Year: Patient unable to answer    Number of Times Moved in the Last Year: 0    Homeless in the Last Year: Patient unable to answer  Utilities: Patient Unable To Answer (08/05/2024)   Epic    Threatened with loss of utilities: Patient unable to answer  Health Literacy: Not on file    Physical Exam      Future Appointments  Date Time Provider Department Center  10/04/2024  2:00 PM [facility] LAB [facility] None  11/09/2024  3:20 PM Anner Alm ORN, MD [facility] H&V  11/14/2024  2:00 PM [facility] PHARMACY [facility] None  11/22/2024  1:40 PM Rolan Ezra RAMAN, MD [facility] None  11/22/2024  2:45 PM Inocencio Soyla Lunger, MD [facility] H&V  12/07/2024  7:05 AM CVD HVT DEVICE REMOTES [facility]  03/08/2025  7:05 AM CVD HVT DEVICE REMOTES [facility]   06/07/2025  7:05 AM CVD HVT DEVICE REMOTES [facility]  07/20/2025  1:15 PM H[facility]-ECHO 1 H[facility]-ECHO H&V  07/20/2025  2:45 PM Sebastian Lamarr SAUNDERS, PA-C [facility]  09/06/2025  7:05 AM CVD HVT DEVICE REMOTES [facility] H&V  12/06/2025  7:05 AM CVD HVT DEVICE REMOTES [facility]&V                 [1]  Current Outpatient Medications:    amoxicillin  (AMOXIL ) 500 MG capsule, Take 4 capsules (2,000 mg total) by mouth as directed 1 hour prior to dental work including cleanings., Disp: 12 capsule, Rfl: 12   aspirin  81 MG chewable tablet, Chew 1 tablet (81 mg total) by mouth daily., Disp: 30 tablet, Rfl: 0   dapagliflozin  propanediol (FARXIGA ) 10 MG TABS tablet, Take 1 tablet (  10 mg total) by mouth daily., Disp: 90 tablet, Rfl: 2   Furosemide  (FUROSCIX ) 80 MG/10ML CTKT, Inject 80 mg into the skin daily as needed (ONLY USE AS DIRECTED BY THE HEART FAILURE CLINIC)., Disp: 5 each, Rfl: 0   furosemide  (LASIX ) 40 MG tablet, Take 2 tablets (80 mg total) by mouth daily., Disp: 180 tablet, Rfl: 3   levothyroxine  (SYNTHROID ) 75 MCG tablet, Take 1 tablet (75 mcg total) by mouth daily before breakfast., Disp: 30 tablet, Rfl: 1   spironolactone  (ALDACTONE ) 25 MG tablet, Take 0.5 tablets (12.5 mg total) by mouth daily., Disp: 45 tablet, Rfl: 3   atorvastatin  (LIPITOR ) 80 MG tablet, Take 1 tablet (80 mg total) by mouth daily., Disp: 30 tablet, Rfl: 1 [2] No Known Allergies

## 2024-09-29 NOTE — Telephone Encounter (Signed)
 HF Paramedicine Message to [facility]  Pharmacy (if applicable): [facility] for delivery    Issue/reason for call:  Refill   Medication refill? Atorvastatin    [name], EMT-Paramedic [phone] 09/29/2024

## 2024-10-03 ENCOUNTER — Other Ambulatory Visit (HOSPITAL_COMMUNITY): Payer: Self-pay

## 2024-10-03 MED ORDER — ATORVASTATIN CALCIUM 80 MG PO TABS
80.0000 mg | ORAL_TABLET | Freq: Every day | ORAL | 3 refills | Status: AC
  Filled 2024-10-03: qty 90, 90d supply, fill #0

## 2024-10-03 NOTE — Telephone Encounter (Signed)
 I requested refill be sent to [facility] FOR DELIVERY on 12/18.   Patient has now been without this medication for 4 days due to not being sent to the right pharmacy.   Please resend to [facility]. Thank you.   [name], EMT-Paramedic [phone] 10/03/2024

## 2024-10-04 ENCOUNTER — Ambulatory Visit (HOSPITAL_COMMUNITY): Payer: Self-pay | Admitting: Cardiology

## 2024-10-04 ENCOUNTER — Ambulatory Visit (HOSPITAL_COMMUNITY)
Admission: RE | Admit: 2024-10-04 | Discharge: 2024-10-04 | Disposition: A | Discharge status: Home / Self Care | Source: Ambulatory Visit | Attending: Internal Medicine | Admitting: Internal Medicine

## 2024-10-04 DIAGNOSIS — I5042 Chronic combined systolic (congestive) and diastolic (congestive) heart failure: Secondary | ICD-10-CM | POA: Diagnosis present

## 2024-10-04 LAB — BASIC METABOLIC PANEL WITH GFR
Anion gap: 12 (ref 5–15)
BUN: 48 mg/dL — ABNORMAL HIGH (ref 8–23)
CO2: 26 mmol/L (ref 22–32)
Calcium: 9.7 mg/dL (ref 8.9–10.3)
Chloride: 97 mmol/L — ABNORMAL LOW (ref 98–111)
Creatinine, Ser: 1.58 mg/dL — ABNORMAL HIGH (ref 0.61–1.24)
GFR, Estimated: 43 mL/min — ABNORMAL LOW
Glucose, Bld: 104 mg/dL — ABNORMAL HIGH (ref 70–99)
Potassium: 5.1 mmol/L (ref 3.5–5.1)
Sodium: 135 mmol/L (ref 135–145)

## 2024-10-07 ENCOUNTER — Other Ambulatory Visit (HOSPITAL_COMMUNITY): Payer: Self-pay

## 2024-10-11 ENCOUNTER — Other Ambulatory Visit (HOSPITAL_COMMUNITY): Payer: Self-pay

## 2024-10-19 ENCOUNTER — Telehealth (HOSPITAL_COMMUNITY): Payer: Self-pay

## 2024-10-19 ENCOUNTER — Other Ambulatory Visit (HOSPITAL_COMMUNITY): Payer: Self-pay

## 2024-10-19 NOTE — Progress Notes (Signed)
 Paramedicine Encounter    Patient ID: [name], male    DOB: 1941-04-28, 84 y.o.   MRN: [id number]   Complaints- none   Assessment- CAOx4, warm and dry outside on his steps sweeping, no shortness of breath, walked inside to the kitchen no shortness of breath, vitals obtained after a few moments of resting, lungs clear, no lower leg edema, weight at baseline   Compliance with meds- no missed dosing- has been filling his own pill box   Pill box filled- he filled for two weeks- I reviewed same, one day he placed two extra lasix  but otherwise was all correct   Refills needed- none   Meds changes since last visit- none     Social changes- none    VISIT SUMMARY- Arrived for home visit for Aston who was outside sweeping his steps on my arrival- he had no shortness of breath during same and none on ambulating back inside to his kitchen table. Vitals and assessment obtained. Lungs clear. No lower leg edema. Meds reviewed- he filled his pill box for two weeks. I reviewed same for accuracy- he did have one day have two extra lasix  but otherwise it was all correct. We reviewed HF management and upcoming appointments. He has been managing things well. Home visit complete. I will see him in two weeks.   BP 110/60   Pulse (!) 52   Resp 18   Wt 145 lb 12.8 oz (66.1 kg)   SpO2 95%   BMI 23.53 kg/m  Weight yesterday-- didn't weigh  Last visit weight-- 145      ACTION: Home visit completed     Patient Care Team: Nichole Senior, MD as PCP - General (Endocrinology) Anner Alm ORN, MD as PCP - Cardiology (Cardiology) Verlin Lonni BIRCH, MD as PCP - Structural Heart (Cardiology) Inocencio Soyla Lunger, MD as PCP - Electrophysiology (Cardiology)  Patient Active Problem List   Diagnosis Date Noted   Chronic combined systolic and diastolic heart failure (HCC) 09/20/2024   Psychosis, unspecified psychosis type (HCC) 08/05/2024   Psychosis (HCC) 08/04/2024   S/P TAVR (transcatheter  aortic valve replacement) 07/26/2024   Coronary artery disease 07/19/2024   Biventricular heart failure, NYHA class 3 (HCC) 07/14/2024   Symptomatic severe aortic stenosis with low ejection fraction 07/14/2024   Acquired hypothyroidism 07/12/2024   Stage 3a chronic kidney disease (HCC) 07/12/2024   Acute on chronic systolic CHF (congestive heart failure) (HCC) 07/12/2024   Hyponatremia 02/09/2022   Essential hypertension 02/09/2022   Obesity 12/29/2011   Type 2 diabetes mellitus with hyperlipidemia (HCC) 04/15/2011   Fatty (change of) liver, not elsewhere classified 07/16/2010   Bipolar disorder (HCC) 08/30/2009   Atherosclerotic heart disease of native coronary artery without angina pectoris 08/30/2009   Hyperlipidemia 08/30/2009   BPH (benign prostatic hyperplasia) 08/30/2009   Sleep apnea 08/30/2009   GERD 04/24/2008   FLATULENCE ERUCTATION AND GAS PAIN 04/24/2008   Current Medications[1] Allergies[2]   Social History   Socioeconomic History   Marital status: Married    Spouse name: Not on file   Number of children: Not on file   Years of education: Not on file   Highest education level: Not on file  Occupational History   Not on file  Tobacco Use   Smoking status: Former    Types: Cigarettes   Smokeless tobacco: Never  Substance and Sexual Activity   Alcohol use: Never   Drug use: Never   Sexual activity: Not on file  Other Topics Concern  Not on file  Social History Narrative   Not on file   Social Drivers of Health   Tobacco Use: Medium Risk (09/27/2024)   Patient History    Smoking Tobacco Use: Former    Smokeless Tobacco Use: Never    Passive Exposure: Not on file  Financial Resource Strain: Not on file  Food Insecurity: Patient Unable To Answer (08/05/2024)   Epic    Worried About Programme Researcher, Broadcasting/film/video in the Last Year: Patient unable to answer    Ran Out of Food in the Last Year: Patient unable to answer  Recent Concern: Food Insecurity - Food  Insecurity Present (07/27/2024)   Epic    Worried About Programme Researcher, Broadcasting/film/video in the Last Year: Sometimes true    Ran Out of Food in the Last Year: Never true  Transportation Needs: Patient Unable To Answer (08/05/2024)   Epic    Lack of Transportation (Medical): Patient unable to answer    Lack of Transportation (Non-Medical): Patient unable to answer  Physical Activity: Not on file  Stress: Not on file  Social Connections: Unknown (08/05/2024)   Social Connection and Isolation Panel    Frequency of Communication with Friends and Family: Patient unable to answer    Frequency of Social Gatherings with Friends and Family: Patient unable to answer    Attends Religious Services: Patient unable to answer    Active Member of Clubs or Organizations: Patient unable to answer    Attends Banker Meetings: Patient unable to answer    Marital Status: Married  Catering Manager Violence: Patient Unable To Answer (08/05/2024)   Epic    Fear of Current or Ex-Partner: Patient unable to answer    Emotionally Abused: Patient unable to answer    Physically Abused: Patient unable to answer    Sexually Abused: Patient unable to answer  Depression (EYV7-0): Not on file  Alcohol Screen: Low Risk (08/05/2024)   Alcohol Screen    Last Alcohol Screening Score (AUDIT): 0  Housing: Unknown (08/05/2024)   Epic    Unable to Pay for Housing in the Last Year: Patient unable to answer    Number of Times Moved in the Last Year: 0    Homeless in the Last Year: Patient unable to answer  Utilities: Patient Unable To Answer (08/05/2024)   Epic    Threatened with loss of utilities: Patient unable to answer  Health Literacy: Not on file    Physical Exam      Future Appointments  Date Time Provider Department Center  11/09/2024  3:20 PM Anner Alm ORN, MD [facility] H&V  11/14/2024  2:00 PM [facility] PHARMACY [facility] None  11/22/2024  1:40 PM Rolan Ezra RAMAN, MD [facility] None  11/22/2024  2:45 PM  Inocencio Soyla Lunger, MD [facility] H&V  12/07/2024  7:05 AM CVD HVT DEVICE REMOTES [facility] H&V  03/08/2025  7:05 AM CVD HVT DEVICE REMOTES [facility] H&V  06/07/2025  7:05 AM CVD HVT DEVICE REMOTES [facility] H&V  07/20/2025  1:15 PM HVC-ECHO 1 HVC-ECHO H&V  07/20/2025  2:45 PM Sebastian Lamarr SAUNDERS, PA-C [facility] H&V  09/06/2025  7:05 AM CVD HVT DEVICE REMOTES [facility] H&V  12/06/2025  7:05 AM CVD HVT DEVICE REMOTES [facility] H&V                 [1]  Current Outpatient Medications:    aspirin  81 MG chewable tablet, Chew 1 tablet (81 mg total) by mouth daily., Disp: 30 tablet, Rfl: 0  atorvastatin  (LIPITOR ) 80 MG tablet, Take 1 tablet (80 mg total) by mouth daily., Disp: 90 tablet, Rfl: 3   dapagliflozin  propanediol (FARXIGA ) 10 MG TABS tablet, Take 1 tablet (10 mg total) by mouth daily., Disp: 90 tablet, Rfl: 2   Furosemide  (FUROSCIX ) 80 MG/10ML CTKT, Inject 80 mg into the skin daily as needed (ONLY USE AS DIRECTED BY THE HEART FAILURE CLINIC)., Disp: 5 each, Rfl: 0   furosemide  (LASIX ) 40 MG tablet, Take 2 tablets (80 mg total) by mouth daily., Disp: 180 tablet, Rfl: 3   levothyroxine  (SYNTHROID ) 75 MCG tablet, Take 1 tablet (75 mcg total) by mouth daily before breakfast., Disp: 30 tablet, Rfl: 1   spironolactone  (ALDACTONE ) 25 MG tablet, Take 0.5 tablets (12.5 mg total) by mouth daily., Disp: 45 tablet, Rfl: 3   amoxicillin  (AMOXIL ) 500 MG capsule, Take 4 capsules (2,000 mg total) by mouth as directed 1 hour prior to dental work including cleanings., Disp: 12 capsule, Rfl: 12 [2] No Known Allergies

## 2024-10-19 NOTE — Telephone Encounter (Signed)
 Left message for Mr. Monje to return my call as his wife states he is out running errands- will follow up later today and tomorrow if no returned call.   [name], EMT-Paramedic [phone] 10/19/2024

## 2024-11-03 ENCOUNTER — Telehealth (HOSPITAL_COMMUNITY): Payer: Self-pay

## 2024-11-03 NOTE — Telephone Encounter (Signed)
 [name] was contacted about paramedicine appointment but elected to reschedule to next week due to conflict with his schedule. He reports he has his medicines and is doing well.   Call complete.    [name], EMT-Paramedic [phone] 11/03/2024

## 2024-11-04 ENCOUNTER — Encounter (HOSPITAL_COMMUNITY): Payer: Self-pay

## 2024-11-04 ENCOUNTER — Emergency Department (HOSPITAL_COMMUNITY)
Admission: EM | Admit: 2024-11-04 | Discharge: 2024-11-05 | Disposition: A | Attending: Student in an Organized Health Care Education/Training Program | Admitting: Student in an Organized Health Care Education/Training Program

## 2024-11-04 ENCOUNTER — Other Ambulatory Visit: Payer: Self-pay

## 2024-11-04 DIAGNOSIS — Z7982 Long term (current) use of aspirin: Secondary | ICD-10-CM | POA: Diagnosis not present

## 2024-11-04 DIAGNOSIS — R4182 Altered mental status, unspecified: Secondary | ICD-10-CM | POA: Diagnosis present

## 2024-11-04 DIAGNOSIS — Y92410 Unspecified street and highway as the place of occurrence of the external cause: Secondary | ICD-10-CM | POA: Diagnosis not present

## 2024-11-04 DIAGNOSIS — Z7984 Long term (current) use of oral hypoglycemic drugs: Secondary | ICD-10-CM | POA: Insufficient documentation

## 2024-11-04 DIAGNOSIS — N189 Chronic kidney disease, unspecified: Secondary | ICD-10-CM | POA: Diagnosis not present

## 2024-11-04 DIAGNOSIS — E119 Type 2 diabetes mellitus without complications: Secondary | ICD-10-CM | POA: Insufficient documentation

## 2024-11-04 DIAGNOSIS — I5020 Unspecified systolic (congestive) heart failure: Secondary | ICD-10-CM | POA: Diagnosis not present

## 2024-11-04 DIAGNOSIS — R410 Disorientation, unspecified: Secondary | ICD-10-CM

## 2024-11-04 LAB — CBG MONITORING, ED: Glucose-Capillary: 180 mg/dL — ABNORMAL HIGH (ref 70–99)
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
## 2024-11-05 LAB — URINALYSIS, ROUTINE W REFLEX MICROSCOPIC
Bacteria, UA: NONE SEEN
Bilirubin Urine: NEGATIVE
Glucose, UA: >=500 mg/dL — AB
Hgb urine dipstick: NEGATIVE
Ketones, ur: NEGATIVE mg/dL
Leukocytes,Ua: NEGATIVE
Nitrite: NEGATIVE
Protein, ur: 100 mg/dL — AB
Specific Gravity, Urine: 1.018 (ref 1.005–1.030)
pH: 5 (ref 5.0–8.0)

## 2024-11-05 LAB — COMPREHENSIVE METABOLIC PANEL WITH GFR
ALT: 43 U/L (ref 0–44)
AST: 59 U/L — ABNORMAL HIGH (ref 15–41)
Albumin: 4.3 g/dL (ref 3.5–5.0)
Alkaline Phosphatase: 103 U/L (ref 38–126)
Anion gap: 12 (ref 5–15)
BUN: 41 mg/dL — ABNORMAL HIGH (ref 8–23)
CO2: 27 mmol/L (ref 22–32)
Calcium: 9.8 mg/dL (ref 8.9–10.3)
Chloride: 97 mmol/L — ABNORMAL LOW (ref 98–111)
Creatinine, Ser: 1.59 mg/dL — ABNORMAL HIGH (ref 0.61–1.24)
GFR, Estimated: 42 mL/min — ABNORMAL LOW
Glucose, Bld: 185 mg/dL — ABNORMAL HIGH (ref 70–99)
Potassium: 4.7 mmol/L (ref 3.5–5.1)
Sodium: 136 mmol/L (ref 135–145)
Total Bilirubin: 0.6 mg/dL (ref 0.0–1.2)
Total Protein: 7.7 g/dL (ref 6.5–8.1)

## 2024-11-05 LAB — CBC
HCT: 42.3 % (ref 39.0–52.0)
Hemoglobin: 13.8 g/dL (ref 13.0–17.0)
MCH: 29.2 pg (ref 26.0–34.0)
MCHC: 32.6 g/dL (ref 30.0–36.0)
MCV: 89.4 fL (ref 80.0–100.0)
Platelets: 139 10*3/uL — ABNORMAL LOW (ref 150–400)
RBC: 4.73 MIL/uL (ref 4.22–5.81)
RDW: 14.9 % (ref 11.5–15.5)
WBC: 10.6 10*3/uL — ABNORMAL HIGH (ref 4.0–10.5)
nRBC: 0 % (ref 0.0–0.2)

## 2024-11-05 LAB — URINE DRUG SCREEN
Amphetamines: NEGATIVE
Barbiturates: NEGATIVE
Benzodiazepines: NEGATIVE
Cocaine: NEGATIVE
Fentanyl: NEGATIVE
Methadone Scn, Ur: NEGATIVE
Opiates: NEGATIVE
Tetrahydrocannabinol: NEGATIVE

## 2024-11-05 LAB — ETHANOL: Alcohol, Ethyl (B): <15 mg/dL
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
## 2024-11-07 NOTE — Progress Notes (Incomplete)
 ***In Progress***    Advanced Heart Failure Clinic Note   Primary Care: Nichole Senior, MD Primary Cardiologist: Alm Clay, MD HF Cardiologist: Dr. Rolan  HPI: [name] is a 84 y.o. male w/ HTN, HLD, DM2, CKD IIIa, hypothyroidism, OSA, biopolar disorder and newly diagnosed HFrEF and aortic stenosis.   Admitted 07/2024 with new HF symptoms and marked volume overload. Echo showed biventricular failure, LVEF 30-35%, RV mod reduced, global hypokinesis w/ disproportionately severe hypokinesis/akinesis in the mid-apical left anterior and anteroseptal walls and severe AS, AVA 0.72 cm, mean gradient 33 mmHg. Interventricular septum flattened in systole, consistent with right ventricular pressure overload. SCr rose w/ diuresis and low BP limited GDMT. Concern for low output HF. AHF team consulted. Underwent R/LHC with occluded LCx with R>L collaterals, severe LAD disease, mildly elevated filling pressures, normal fick CI (RA 7, PA 69/23 (38), PAPi 6.6, Fick CO/CI 4.67/2.56)). Optimized from AHF standpoint, discharged with plan for scheduled TAVR 07/26/24.    Underwent TAVR 07/26/24 by Dr. Shyrl. 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 EP consulted. Echo showed EF 25-30%, LV with RWMA, GIIDD, RV normal, LA mod dilated, RA mildly dilated, no MR, normal structure/function of AV. Underwent PPM 07/28/24. Echo after TAVR 08/2024 showed EF 30%, severe pulm HTN, mod-severe MAC with mild MR/MS, mild-mod TR, normally functioning TAVR with a mean gradient of 7.5 mm hg and no PVL. Staged PCI, pending dental extractions.    07/2024: Follow up post hospital, been off all meds and mildly volume overloaded. Meds resumed. Admitted later that month with AMS/psychosis. Required 3 day admission to [facility] unit. ECG showed prolonged QTC, risperdol changed to Abilify .   09/27/2024: Clinic follow-up with PA-C. At last visit, he was volume overloaded and diuretics adjusted. ReDs was 44%. Was feeling better. Wt was down 4  lbs. ReDs improved but was still mildly elevated at 37%. BP was well controlled. LEE was much improved. No resting dyspnea. NYHA Class II.  Today he returns to HF clinic for pharmacist medication titration. At last visit with PA-C paitent started on spironolactone  12.5 mg daily, previous potassium chloride  supplement stopped with elevated serum potassium. ***  Shortness of breath/dyspnea on exertion? {YES NO:22349}  Orthopnea/PND? {YES NO:22349} Edema? {YES NO:22349} Lightheadedness/dizziness? {YES NO:22349} Daily weights at home? {YES NO:22349} Blood pressure/heart rate monitoring at home? {YES [id number] Following low-sodium/fluid-restricted diet? {YES NO:22349}  HF Medications: Spironolactone  12.5 mg daily Jardiance  10 mg daily Furosemide  80 mg daily  Has the patient been experiencing any side effects to the medications prescribed?  {YES NO:22349}  Does the patient have any problems obtaining medications due to transportation or finances?   {YES NO:22349}  Understanding of regimen: {excellent/good/fair/poor:19665} Understanding of indications: {excellent/good/fair/poor:19665} Potential of compliance: {excellent/good/fair/poor:19665} Patient understands to avoid NSAIDs. Patient understands to avoid decongestants.    Pertinent Lab Values: (11/04/24 in ED) Serum creatinine 1.59, BUN 41, Potassium 4.7, Sodium 136  Vital Signs: Weight: *** (last clinic weight: 150.6 lbs) Blood pressure: ***  Heart rate: ***   Assessment/Plan: 1. Chronic Biventricular Systolic Heart Failure: Recent diagnosis. Echo 07/13/24 LVEF 30-35%, RV mod reduced, GHK w/ disproportionately severe hypokinesis/akinesis in the mid-apical L anterior and anteroseptal walls and severe AS, AVA 0.72 cm, mean gradient 33 mmHg. Interventricular septum flattened in systole, consistent with RV pressure overload, mod RV dysfunction with mild RV enlargement. 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 RHC, after diuresis, showed mildly elevated filling pressures and normal CI by FICK (RA 7, PCW 18, CI  2.6), LHC showed occluded Lcx with right to left collaterals and severe LAD disease. Echo 08/2024: EF 30-35%, RV low normal, severely elevated RVSP, severe BAE, moderate to severe MAC, AV prosthesis okay, dilated IVC - NYHA Class II. Volume status improving. Still w/ mild volume overload on exam *** - Continue furosemide  80 mg daily *** - Continue spironolactone  12.5 mg daily *** - Continue Farxiga  10 mg daily.   2. Aortic Stenosis: severe by echo, AVA by VTI measured at 72 cm, mean gradient 33 mmHg. - S/p successful TAVR with a 26 mm Edwards Sapien 3 Ultra Resilia THV via the TF approach on 07/26/24.  - Post procedure echo showed EF 25-30%, mild RVE, normally functioning TAVR with a mean gradient of 3 mmHg and no PVL  - Echo 11/25 stable - Needs SBE prophylaxis with Amoxicillin  prior to dental extractions  - Follows with Structural Heart Team  3. CAD: LHC 10/25 with severe LAD disease and occluded LCx w/ R>>L collaterals. Plan for staged PCI post dental extractions (he has had to post-pone extractions until volume stabilized). Denies CP  - Continue atorvastatin  80 mg daily.  - Continue ASA 81. - Plan for staged PCI after dental extractions have been completed per note from Dr. Anner  4. CKD IIIa: Baseline SCr 1.3  - Continue Farxiga .   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   6. Hypothyroidism: - Continue levothyroxine .  7. SDOH: Lives with wife, son lives next door and helps out. Behavioral/psych disturbances complicate medication adherence. ? Dementia. - Paramedicine now on board. Appreciate assistance   - Basic disease state pathophysiology, medication indication, mechanism and side effects reviewed at length with patient and he verbalized understanding  Follow up in 1 week with Dr. Rolan Nidia Schaffer, PharmD, [facility] PGY2 Cardiology Pharmacy  Resident

## 2024-11-09 ENCOUNTER — Ambulatory Visit: Attending: Cardiology | Admitting: Cardiology

## 2024-11-09 ENCOUNTER — Other Ambulatory Visit (HOSPITAL_COMMUNITY): Payer: Self-pay

## 2024-11-09 ENCOUNTER — Encounter: Payer: Self-pay | Admitting: Cardiology

## 2024-11-09 VITALS — BP 121/63 | HR 48 | Ht 66.0 in | Wt 141.0 lb

## 2024-11-09 DIAGNOSIS — N1831 Chronic kidney disease, stage 3a: Secondary | ICD-10-CM | POA: Diagnosis not present

## 2024-11-09 DIAGNOSIS — Z952 Presence of prosthetic heart valve: Secondary | ICD-10-CM | POA: Diagnosis not present

## 2024-11-09 DIAGNOSIS — I350 Nonrheumatic aortic (valve) stenosis: Secondary | ICD-10-CM | POA: Diagnosis not present

## 2024-11-09 DIAGNOSIS — I2510 Atherosclerotic heart disease of native coronary artery without angina pectoris: Secondary | ICD-10-CM | POA: Diagnosis not present

## 2024-11-09 DIAGNOSIS — E1169 Type 2 diabetes mellitus with other specified complication: Secondary | ICD-10-CM | POA: Diagnosis not present

## 2024-11-09 DIAGNOSIS — Z950 Presence of cardiac pacemaker: Secondary | ICD-10-CM | POA: Diagnosis not present

## 2024-11-09 DIAGNOSIS — Z7689 Persons encountering health services in other specified circumstances: Secondary | ICD-10-CM | POA: Diagnosis not present

## 2024-11-09 DIAGNOSIS — E785 Hyperlipidemia, unspecified: Secondary | ICD-10-CM | POA: Diagnosis not present

## 2024-11-09 DIAGNOSIS — K089 Disorder of teeth and supporting structures, unspecified: Secondary | ICD-10-CM

## 2024-11-09 DIAGNOSIS — I5022 Chronic systolic (congestive) heart failure: Secondary | ICD-10-CM

## 2024-11-09 DIAGNOSIS — I10 Essential (primary) hypertension: Secondary | ICD-10-CM | POA: Diagnosis not present

## 2024-11-09 DIAGNOSIS — I5042 Chronic combined systolic (congestive) and diastolic (congestive) heart failure: Secondary | ICD-10-CM

## 2024-11-09 MED ORDER — CLOPIDOGREL BISULFATE 75 MG PO TABS
75.0000 mg | ORAL_TABLET | Freq: Every day | ORAL | 3 refills | Status: AC
  Filled 2024-11-09: qty 100, 100d supply, fill #0

## 2024-11-09 NOTE — Patient Instructions (Addendum)
 Medication Instructions:   Start taking Clopidogrel  75 mg( 300 mg )   daily  - on the first day take 4 tablets  by mouth.   Once you have dental appointment and the Dentist  schedule treatment-- you can stop Clopidogrel   7 days prior to treatment and the 2 days after the treatment  the first day  take 2 tablet (150 mg) then continue with one tablet  ( 75 mg) daily   *If you need a refill on your cardiac medications before your next appointment, please call your pharmacy*   Lab Work: Not needed    Testing/Procedures:   Once you have schedule  dental work please call the office  for scheduling your procedure.  Follow-Up: At [facility], you and your health needs are our priority.  As part of our continuing mission to provide you with exceptional heart care, we have created designated Provider Care Teams.  These Care Teams include your primary Cardiologist (physician) and Advanced Practice Providers (APPs -  Physician Assistants and Nurse Practitioners) who all work together to provide you with the care you need, when you need it.     Your next appointment:    Schedule  after April 10,2026   The format for your next appointment:   In Person  Provider:   One of our Advanced Practice Providers (APPs): Morse Clause, PA-C  Hanh Waddell Daniels, PA-C  Saddie Cleaves, NP  Olivia Pavy, PA-C Miriam Shams, NP  Leontine Salen, PA-C Josefa Beauvais, NP  [facility], PA-C [place], PA-C  [place], PA-C [place], [place]  Damien Braver, NP Jon Hails, PA-C  Waddell Donath, PA-C Dayna Dunn, PA-C  [place], PA-C [place], NP Glendia Ferrier, PA-C Callie Goodrich, PA-C  Katlyn West, NP Thom Sluder, PA-C  Alyssa White, NP Rollo Louder, PA-C Xika Zhao, NP    Lamarr Satterfield, NP                    .   Other Instructions   You have been referred to Dr Krystal Muslim for dental evaluation and treatment. His office is located at [address], [place],  [place] [address]. Please contact the office in 1 week at [phone] to arrange appointment, if they have not contacted you for an appointment.
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
## 2024-11-10 ENCOUNTER — Encounter: Payer: Self-pay | Admitting: Cardiology

## 2024-11-10 ENCOUNTER — Telehealth (HOSPITAL_COMMUNITY): Payer: Self-pay

## 2024-11-10 ENCOUNTER — Other Ambulatory Visit (HOSPITAL_COMMUNITY): Payer: Self-pay

## 2024-11-10 DIAGNOSIS — Z950 Presence of cardiac pacemaker: Secondary | ICD-10-CM | POA: Insufficient documentation

## 2024-11-10 DIAGNOSIS — Z7689 Persons encountering health services in other specified circumstances: Secondary | ICD-10-CM | POA: Insufficient documentation

## 2024-11-10 NOTE — Telephone Encounter (Signed)
 HF Paramedicine Message to [facility]  Pharmacy (if applicable): [facility]    Issue/reason for call: refill needs to be resent to [facility]   Medication refill? Levothyroxine      He threw away his medication of Levothyroxine  during a moment of Altered Mental Status on Friday and now is needing refills sent to [facility] please.    Thanks.   [name], EMT-Paramedic [phone] 11/10/2024

## 2024-11-10 NOTE — Assessment & Plan Note (Signed)
 Currently on Farxiga  10 mg for CHF more than diabetes.  Last A1c was 6.1.  Lipids not adequately controlled with most recent lipids in October showing LDL of 89.  Now on 80 mg atorvastatin , would be due for follow-up labs in the next couple months.  For now continue atorvastatin  80 mg daily and reassess lipids at follow-up.  (Can be done as part of precath labs or on the day of-PCI)

## 2024-11-10 NOTE — Assessment & Plan Note (Signed)
 Clearly this played a role in the exacerbation of his most recent CHF episode in the hospital.  Now status post TAVR. Valve looks stable on follow-up Echo x 2.

## 2024-11-10 NOTE — Assessment & Plan Note (Signed)
 Referred to Dr Krystal Muslim for dental evaluation and treatment. His office is located at [address], [place], [place] [address].   Plan is for patient/son to contact the office in 1 week at [phone] to arrange appointment, if the office does not contact him directly.   Once dental extractions have been completed, we can plan to schedule staged PCI. Would hold Plavix  5 to 7 days preop for extractions. Postop reload 150 mg (2 tabs) x 1 day and then 75 mg daily.

## 2024-11-10 NOTE — Telephone Encounter (Signed)
 This medication is managed by PCP, Dr Nichole and refills/prescriptions will need to come from him

## 2024-11-10 NOTE — Assessment & Plan Note (Signed)
 Post-TAVR with minimally improved EF back to the pre-TAVR baseline.  Minimal if any heart failure symptoms. - Continue monitoring heart function and symptoms.  Valve able to follow-up echocardiogram.  Still monitored by Valvular Heart Team. Has amoxicillin  ordered for SBE prophylaxis.

## 2024-11-10 NOTE — Assessment & Plan Note (Signed)
 Most recent creatinine 1.659.  Relatively stable.  Will need to be cognizant of renal function in the setting of low EF when considering hydration pre and post cardiac catheterization.

## 2024-11-10 NOTE — Assessment & Plan Note (Addendum)
 Currently doing well with NYHA class II symptoms.  . EF improved to 30-35% post-TAVR.    No symptoms of CHF. Relatively euvolemic on exam with trivial edema Does not complain of PND orthopnea or significant exertional dyspnea.  Defer management of CHF to Advanced Heart Failure Service with upcoming visit with Dr. Rolan on February 10. - For now continue spironolactone  12.5 mg daily along with Farxiga  10 mg daily. - Hold off on beta-blocker until assessed by EP as far as bradycardia with heart rate of 48 bpm despite having PPM in place. - Would anticipate initiation of at least ARB if not Entresto at upcoming follow-up with Dr. Rolan

## 2024-11-10 NOTE — Assessment & Plan Note (Addendum)
 Pacemaker placed post-TAVR for atrial fibrillation. No pacemaker issues or arrhythmia symptoms. Question is heart rate of 48 bpm with PPM in place.  Follow-up with Dr. Inocencio on February 10 after [facility].

## 2024-11-10 NOTE — Assessment & Plan Note (Addendum)
 Critical 80% LAD stenosis and LCx CTO..  Thankfully he is doing well with no active angina to the extent that he is able to be active..  Planned stenting post-dental procedures.  Risks: MI, stroke, renal irritation.  Benefits: improved perfusion, reduced cardiac stress. Increased procedural risks due to age, calcification, low EF. - Started Plavix  75 mg daily, loaded with 300 mg on day one. - Schedule dental procedures before LAD stenting. - Hold Plavix  7 days pre-dental procedures. - Resume Plavix  with two tablets post-dental procedures, then daily. - Schedule PCI of LAD post-dental procedures. Continue 81 mg aspirin  along with atorvastatin . Will defer other cardiac medication management to advanced heart failure service. With resting heart rate 40 bpm, need to ensure that his pacemaker is actually functioning.  Would not start beta-blocker until certain that it is still working. As BP tolerates, anticipate that the advanced heart failure service will consider Entresto, or at least ARB.

## 2024-11-10 NOTE — Assessment & Plan Note (Signed)
 Has had relatively low blood pressures precluding the initiation of standard CHF medications.  Slowly titrate medications up as BP tolerates.
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
## 2024-11-14 ENCOUNTER — Ambulatory Visit (HOSPITAL_COMMUNITY): Admission: RE | Admit: 2024-11-14 | Source: Ambulatory Visit

## 2024-11-18 ENCOUNTER — Other Ambulatory Visit (HOSPITAL_COMMUNITY): Payer: Self-pay

## 2024-11-18 ENCOUNTER — Telehealth: Payer: Self-pay | Admitting: Cardiology

## 2024-11-18 NOTE — Telephone Encounter (Signed)
 S/w the patient and he reports that he has a head cold or allergies/congestion. He woke up and he could not breathe well, he got a little excited/worried. He blew his nose and a lot of mucous came out. He calmed down because he could breathe better afterwards.  Went to the dentist yesterday and they will call him soon to schedule the dental procedure.  Instructed him to follow up with PCP and/or urgent care if he feels it is progressing into an infection. He verbalized understanding.

## 2024-11-18 NOTE — Telephone Encounter (Signed)
 Pt c/o Shortness Of Breath: STAT if SOB developed within the last 24 hours or pt is noticeably SOB on the phone  1. Are you currently SOB (can you hear that pt is SOB on the phone)?   No  2. How long have you been experiencing SOB?   Right after last OV with Dr. Anner (1/28)  3. Are you SOB when sitting or when up moving around?   When up and moving around  4. Are you currently experiencing any other symptoms?   Fatigue   Patient stated he had a dental visit yesterday and they had trouble getting his blood pressure.  Patient is concerned about having his stent procedure after his dental procedure.

## 2024-11-18 NOTE — Telephone Encounter (Signed)
 Saw Mr. Njie in the home today and he says his symptoms have resolved after using saline spray and a nasal saline rinse. Vitals all within normal limits. He had no further complaints.   [name], EMT-Paramedic [phone] 11/18/2024

## 2024-11-18 NOTE — Progress Notes (Signed)
 Paramedicine Encounter    Patient ID: [name], male    DOB: 01-Sep-1941, 84 y.o.   MRN: [id number]   Complaints- sinus congestion earlier but feels better   Assessment- CAOX4, warm and dry ambulatory without shortness of breath, no dizziness, no chest pain, minimal lower leg swelling, lungs clear, vitals within normal limits. Sinus congestion but feeling okay after using saline sprays.   Compliance with meds- missing levothyroxine  (I called [facility] pharmacy to refill same they will have to get RX from [facility] as PCP has not returned my call to fill)   Pill box filled- for one week   Refills needed- Levothyroxine    Meds changes since last visit- has not started Plavix  yet as his dental procedure was to be yesterday to remove his teeth but now its been moved to next week and the son does not want him to start it yet with the instructions to hold it 7 days prior to procedure.     Social changes- attempted to get him seen by PCP but he no showed- I advised him and his son to call and reschedule ASAP.    VISIT SUMMARY- Arrived to see [name] for home visit and he reports to be feeling well with some sinus congestion today but says its better after use of saline spray. He is ambulatory without shortness of breath, lungs clear, minimal lower leg swelling. I reviewed pill box his son filled and its accurate. I obtained vitals and assessment as noted. I plan to see him in one week- he agreed with plan. Other appointments reviewed and confirmed and wrote down. I reminded his son of same. I will meet them in clinic next week.   BP (!) 128/58   Pulse 64   Resp 16   Wt 140 lb 3.2 oz (63.6 kg)   SpO2 96%   BMI 22.63 kg/m  Weight yesterday-- did not weigh  Last visit weight-- 141lbs      ACTION: Home visit completed     Patient Care Team: Nichole Senior, MD as PCP - General (Endocrinology) Anner Alm ORN, MD as PCP - Cardiology (Cardiology) Verlin Lonni BIRCH, MD as PCP -  Structural Heart (Cardiology) Inocencio Soyla Lunger, MD as PCP - Electrophysiology (Cardiology)  Patient Active Problem List   Diagnosis Date Noted   Poor dentition requiring referral to dentistry 11/10/2024   Cardiac pacemaker 11/10/2024   Chronic combined systolic and diastolic heart failure (HCC) 09/20/2024   Psychosis, unspecified psychosis type (HCC) 08/05/2024   Psychosis (HCC) 08/04/2024   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/26/2024   Biventricular heart failure, NYHA class 3 (HCC) 07/14/2024   Symptomatic severe aortic stenosis with low ejection fraction 07/14/2024   Acquired hypothyroidism 07/12/2024   Stage 3a chronic kidney disease (HCC) 07/12/2024   Hyponatremia 02/09/2022   Essential hypertension 02/09/2022   Obesity 12/29/2011   Type 2 diabetes mellitus with hyperlipidemia (HCC) 04/15/2011   Fatty (change of) liver, not elsewhere classified 07/16/2010   Bipolar disorder (HCC) 08/30/2009   Coronary artery disease involving native coronary artery of native heart without angina pectoris 08/30/2009   Hyperlipidemia 08/30/2009   BPH (benign prostatic hyperplasia) 08/30/2009   Sleep apnea 08/30/2009   GERD 04/24/2008   FLATULENCE ERUCTATION AND GAS PAIN 04/24/2008   Current Medications[1] Allergies[2]   Social History   Socioeconomic History   Marital status: Married    Spouse name: Not on file   Number of children: Not on file   Years of education: Not  on file   Highest education level: Not on file  Occupational History   Not on file  Tobacco Use   Smoking status: Former    Types: Cigarettes   Smokeless tobacco: Never  Substance and Sexual Activity   Alcohol use: Never   Drug use: Never   Sexual activity: Not on file  Other Topics Concern   Not on file  Social History Narrative   Not on file   Social Drivers of Health   Tobacco Use: Medium Risk (11/10/2024)   Patient History    Smoking Tobacco Use: Former    Smokeless Tobacco Use:  Never    Passive Exposure: Not on file  Financial Resource Strain: Not on file  Food Insecurity: Patient Unable To Answer (08/05/2024)   Epic    Worried About Programme Researcher, Broadcasting/film/video in the Last Year: Patient unable to answer    Ran Out of Food in the Last Year: Patient unable to answer  Recent Concern: Food Insecurity - Food Insecurity Present (07/27/2024)   Epic    Worried About Programme Researcher, Broadcasting/film/video in the Last Year: Sometimes true    Ran Out of Food in the Last Year: Never true  Transportation Needs: Patient Unable To Answer (08/05/2024)   Epic    Lack of Transportation (Medical): Patient unable to answer    Lack of Transportation (Non-Medical): Patient unable to answer  Physical Activity: Not on file  Stress: Not on file  Social Connections: Unknown (08/05/2024)   Social Connection and Isolation Panel    Frequency of Communication with Friends and Family: Patient unable to answer    Frequency of Social Gatherings with Friends and Family: Patient unable to answer    Attends Religious Services: Patient unable to answer    Active Member of Clubs or Organizations: Patient unable to answer    Attends Banker Meetings: Patient unable to answer    Marital Status: Married  Intimate Partner Violence: Patient Unable To Answer (08/05/2024)   Epic    Fear of Current or Ex-Partner: Patient unable to answer    Emotionally Abused: Patient unable to answer    Physically Abused: Patient unable to answer    Sexually Abused: Patient unable to answer  Depression (EYV7-0): Not on file  Alcohol Screen: Low Risk (08/05/2024)   Alcohol Screen    Last Alcohol Screening Score (AUDIT): 0  Housing: Unknown (08/05/2024)   Epic    Unable to Pay for Housing in the Last Year: Patient unable to answer    Number of Times Moved in the Last Year: 0    Homeless in the Last Year: Patient unable to answer  Utilities: Patient Unable To Answer (08/05/2024)   Epic    Threatened with loss of utilities:  Patient unable to answer  Health Literacy: Not on file    Physical Exam      Future Appointments  Date Time Provider Department Center  11/22/2024  1:40 PM Rolan Ezra RAMAN, MD [facility] None  11/22/2024  2:45 PM Inocencio Soyla Lunger, MD [facility]  12/07/2024  7:05 AM [facility] DEVICE REMOTES [facility]  01/31/2025 10:20 AM Anner Alm ORN, MD [facility]  03/08/2025  7:05 AM [facility] DEVICE REMOTES [facility]  06/07/2025  7:05 AM [facility] DEVICE REMOTES [facility]  07/20/2025  1:15 PM [facility] 1 [facility]V  07/20/2025  2:45 PM Sebastian Lamarr SAUNDERS, PA-C [facility]  09/06/2025  7:05 AM [facility] DEVICE REMOTES [facility]  12/06/2025  7:05 AM [facility] DEVICE REMOTES [facility]                 [1]  Current Outpatient Medications:    amoxicillin  (AMOXIL ) 500 MG capsule, Take 4 capsules (2,000 mg total) by mouth as directed 1 hour prior to dental work including cleanings., Disp: 12 capsule, Rfl: 12   aspirin  81 MG chewable tablet, Chew 1 tablet (81 mg total) by mouth daily., Disp: 30 tablet, Rfl: 0   atorvastatin  (LIPITOR ) 80 MG tablet, Take 1 tablet (80 mg total) by mouth daily., Disp: 90 tablet, Rfl: 3   clopidogrel  (PLAVIX ) 75 MG tablet, Take 1 tablet (75 mg total) by mouth daily., Disp: 100 tablet, Rfl: 3   dapagliflozin  propanediol (FARXIGA ) 10 MG TABS tablet, Take 1 tablet (10 mg total) by mouth daily., Disp: 90 tablet, Rfl: 2   furosemide  (LASIX ) 40 MG tablet, Take 2 tablets (80 mg total) by mouth daily., Disp: 180 tablet, Rfl: 3   Furosemide  (FUROSCIX ) 80 MG/10ML CTKT, Inject 80 mg into the skin daily as needed (ONLY USE AS DIRECTED BY THE HEART FAILURE CLINIC)., Disp: 5 each, Rfl: 0   levothyroxine  (SYNTHROID ) 75 MCG tablet, Take 1 tablet (75 mcg total) by mouth daily before breakfast. (Patient taking differently: Take 75 mcg by mouth daily before breakfast. Threw away 11/04/24), Disp: 30 tablet, Rfl: 1   spironolactone  (ALDACTONE ) 25 MG tablet, Take 0.5  tablets (12.5 mg total) by mouth daily., Disp: 45 tablet, Rfl: 3 [2] No Known Allergies

## 2024-11-22 ENCOUNTER — Ambulatory Visit: Admitting: Cardiology

## 2024-11-22 ENCOUNTER — Ambulatory Visit (HOSPITAL_COMMUNITY): Admitting: Cardiology

## 2024-12-07 ENCOUNTER — Ambulatory Visit

## 2025-01-31 ENCOUNTER — Ambulatory Visit: Admitting: Cardiology

## 2025-03-08 ENCOUNTER — Ambulatory Visit

## 2025-06-07 ENCOUNTER — Ambulatory Visit

## 2025-07-20 ENCOUNTER — Ambulatory Visit: Admitting: Physician Assistant

## 2025-07-20 ENCOUNTER — Ambulatory Visit (HOSPITAL_COMMUNITY)

## 2025-09-06 ENCOUNTER — Ambulatory Visit

## 2025-12-06 ENCOUNTER — Ambulatory Visit
